# Patient Record
Sex: Female | Born: 1955 | Race: White | Hispanic: No | Marital: Married | Smoking: Former smoker
Health system: Southern US, Community
[De-identification: ages and names within clinical notes are randomized; demographics above are authoritative.]

## PROBLEM LIST (undated history)

## (undated) DIAGNOSIS — Z9289 Personal history of other medical treatment: Secondary | ICD-10-CM

## (undated) DIAGNOSIS — F329 Major depressive disorder, single episode, unspecified: Secondary | ICD-10-CM

## (undated) DIAGNOSIS — F32A Depression, unspecified: Secondary | ICD-10-CM

## (undated) DIAGNOSIS — F419 Anxiety disorder, unspecified: Secondary | ICD-10-CM

## (undated) DIAGNOSIS — E328 Other diseases of thymus: Secondary | ICD-10-CM

## (undated) DIAGNOSIS — G4730 Sleep apnea, unspecified: Secondary | ICD-10-CM

## (undated) DIAGNOSIS — H3530 Unspecified macular degeneration: Secondary | ICD-10-CM

## (undated) DIAGNOSIS — R1011 Right upper quadrant pain: Secondary | ICD-10-CM

## (undated) DIAGNOSIS — E669 Obesity, unspecified: Secondary | ICD-10-CM

## (undated) DIAGNOSIS — J9859 Other diseases of mediastinum, not elsewhere classified: Secondary | ICD-10-CM

## (undated) DIAGNOSIS — K760 Fatty (change of) liver, not elsewhere classified: Secondary | ICD-10-CM

## (undated) DIAGNOSIS — I493 Ventricular premature depolarization: Secondary | ICD-10-CM

## (undated) DIAGNOSIS — E559 Vitamin D deficiency, unspecified: Secondary | ICD-10-CM

## (undated) HISTORY — DX: Vitamin D deficiency, unspecified: E55.9

## (undated) HISTORY — DX: Obesity, unspecified: E66.9

## (undated) HISTORY — DX: Other diseases of mediastinum, not elsewhere classified: J98.59

## (undated) HISTORY — DX: Unspecified macular degeneration: H35.30

## (undated) HISTORY — DX: Right upper quadrant pain: R10.11

## (undated) HISTORY — DX: Sleep apnea, unspecified: G47.30

## (undated) HISTORY — DX: Other diseases of thymus: E32.8

---

## 1986-06-30 HISTORY — PX: TUBAL LIGATION: SHX77

## 2006-06-30 HISTORY — PX: EYE SURGERY: SHX253

## 2007-03-25 ENCOUNTER — Ambulatory Visit (HOSPITAL_COMMUNITY): Admission: RE | Admit: 2007-03-25 | Discharge: 2007-03-25 | Payer: Self-pay | Admitting: Ophthalmology

## 2007-04-08 ENCOUNTER — Ambulatory Visit (HOSPITAL_COMMUNITY): Admission: RE | Admit: 2007-04-08 | Discharge: 2007-04-08 | Payer: Self-pay | Admitting: Ophthalmology

## 2007-11-03 ENCOUNTER — Ambulatory Visit: Payer: Self-pay | Admitting: Thoracic Surgery

## 2008-09-20 ENCOUNTER — Ambulatory Visit: Payer: Self-pay | Admitting: Thoracic Surgery

## 2008-09-20 ENCOUNTER — Encounter: Admission: RE | Admit: 2008-09-20 | Discharge: 2008-09-20 | Payer: Self-pay | Admitting: Thoracic Surgery

## 2009-06-13 ENCOUNTER — Ambulatory Visit: Payer: Self-pay | Admitting: Thoracic Surgery

## 2009-06-13 ENCOUNTER — Encounter: Admission: RE | Admit: 2009-06-13 | Discharge: 2009-06-13 | Payer: Self-pay | Admitting: Thoracic Surgery

## 2009-12-14 ENCOUNTER — Encounter: Admission: RE | Admit: 2009-12-14 | Discharge: 2009-12-14 | Payer: Self-pay | Admitting: Thoracic Surgery

## 2009-12-14 ENCOUNTER — Ambulatory Visit: Payer: Self-pay | Admitting: Thoracic Surgery

## 2010-06-18 ENCOUNTER — Encounter
Admission: RE | Admit: 2010-06-18 | Discharge: 2010-06-18 | Payer: Self-pay | Source: Home / Self Care | Attending: Thoracic Surgery | Admitting: Thoracic Surgery

## 2010-06-18 ENCOUNTER — Ambulatory Visit: Payer: Self-pay | Admitting: Thoracic Surgery

## 2010-07-21 ENCOUNTER — Encounter: Payer: Self-pay | Admitting: Thoracic Surgery

## 2010-10-29 ENCOUNTER — Other Ambulatory Visit: Payer: Self-pay | Admitting: Thoracic Surgery

## 2010-10-29 DIAGNOSIS — J9859 Other diseases of mediastinum, not elsewhere classified: Secondary | ICD-10-CM

## 2010-11-12 NOTE — Letter (Signed)
Nov 03, 2007   Ernestina Penna, M.D.  [address] [place], [place] [address]   Re:  Kayla, Romero               DOB:  May 29, 1956   Dear Roe Coombs:   This is in reference to your referral of Kayla Romero.  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  The studies were done at [facility], and she  subsequently had several studies since then.  This apparently was  described as a low density anterior mediastinal prevascular space mass  that had measured 21 x 19 in diameter.  Unfortunately, the disc they  sent could not be opened on our computer.  So, I discussed in  generalities the followup of an anterior mediastinal or thymic mass.  She has had no symptomatology.  Pulmonary function tests showed an FVC  of 3.15 and an FEV1 of 2.43.   MEDICATIONS:  Xanax p.r.n.   ALLERGIES:  SHE HAS NO KNOWN ALLERGIES.   PAST MEDICAL HISTORY:  Unremarkable.   FAMILY HISTORY:  Noncontributory.   SOCIAL HISTORY:  She is divorced and has 3 children.  Works for a Technical sales engineer.  Quit smoking 3 years ago.  Does not drink alcohol on a regular basis.   REVIEW OF SYSTEMS:  GENERAL:  Her weight has been stable.  CARDIAC:  No  angina or atrial fibrillation.  PULMONARY:  No hemoptysis, asthma, or  bronchitis.  GI:  No GERD, nausea, vomiting, constipation.  GU:  No  kidney disease or frequent urination.  VASCULAR:  No claudication, DVT,  TIA.  NEUROLOGIC:  No dizziness, headaches, blackouts, seizures.  MUSCULOSKELETAL:  No arthritis or joint pains.  PSYCHIATRIC:  No  depression or nervous.  EYE/ENT:  No change in her eyesight or her  hearing.  HEMATOLOGIC:  No problems with bleeding, clotting disorders,  or anemia.   PHYSICAL EXAMINATION:  GENERAL:  She is a well-developed, Caucasian  female in no acute distress.  VITAL SIGNS:  Blood pressure is 109/70, pulse 64, respirations 18.   Sats  were 99%.  HEAD, EYES, EARS, NOSE, AND THROAT:  Unremarkable.  NECK:  Supple without thyromegaly.  There is no supraclavicular or  axillary adenopathy.  CHEST:  Clear to auscultation and percussion.  HEART:  Regular sinus rhythm.  No murmurs.  ABDOMEN:  Soft.  There is no hepatosplenomegaly.  EXTREMITIES:  Pulses are 2+.  There is no clubbing or edema.  NEUROLOGICAL:  She is oriented x3.  Sensory and motor intact.  Cranial  nerves are intact.   I plan to review her CT scans once we get them.  I think this is  probably either a lymph node or a small thymic cyst or possibly  hyperplasia.  I plan to give her a call and make my recommendations  after I review her CT scan.   Sincerely,   Ines Bloomer, M.D.  Electronically Signed   DPB/MEDQ  D:  11/03/2007  T:  11/03/2007  Job:  [id number]   cc:   Lindaann Pascal, PA-C

## 2010-11-12 NOTE — Letter (Signed)
December 14, 2009   Ernestina Penna, M.D.  [address] [place]  [place] [address]   Re:  Kayla Romero, Kayla Romero               DOB:  08-24-1955   I saw the patient back today and her CT scan appears about the same.  They do recommend that they thought it might be larger because of volume  and/or it could be just from volume averaging.  Her blood pressure is  118/80, pulse 81, respirations 18, and saturations were 97%.  This looks  like, this is possibly a lymphoid tissue and I am concerned that we may  need to excise this.  She is very reluctant to have any surgery.  Since  there has only been minimal to no change, I think we can still continue  to watch this, so I will see her back again in 6 months with a CT scan.   Ines Bloomer, M.D.  Electronically Signed   DPB/MEDQ  D:  12/14/2009  T:  12/15/2009  Job:  [id number]

## 2010-11-12 NOTE — Letter (Signed)
Nov 03, 2007   Ernestina Penna, M.D.  [address] [place], [place]  [address]   Re:  ADALAE, BAYSINGER               DOB:  [birth year]   Dear Roe Coombs:   This is really a followup to my initial letter.  We were able to get the  final CT scans on Ms. Ilsa Iha, and I reviewed them from March 2007 up to  February 2009.  This lesion in the left inferior lobe of the thymus has  been there with no change.  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  I doubt that this is a thymic cyst, but since it is there there is a  slight possibility this could be lymphoma or thymoma.  I will see her  back again in 6-8 months with another CT scan done at [facility].  Ms. Ilsa Iha  agrees with this plan.   Ines Bloomer, M.D.  Electronically Signed   DPB/MEDQ  D:  11/03/2007  T:  11/03/2007  Job:  [id number]

## 2010-11-12 NOTE — Letter (Signed)
June 13, 2009   Ernestina Penna, MD  [address] [place], [place] [address]   Re:  ROIZA, WIEDEL               DOB:  [birth year]   Dear Roe Coombs,   I saw the patient back today.  She had her CT scan; however, I do not  have the final reading on this, but it looks like this mass in the left  thymus is about the same or maybe slightly larger.  Her blood pressure  is 130/80, pulse 64, respirations 16, sats are 98%.  Await until I get  the final report before making a determination, but if it is starting to  get bigger I told her that we probably would need to excise this in the  future.  Right now, I am planning to get another CT scan in 6 months  rather than waiting 9 months.   Sincerely,   Ines Bloomer, M.D.  Electronically Signed   DPB/MEDQ  D:  06/13/2009  T:  06/14/2009  Job:  [id number]

## 2010-11-12 NOTE — Letter (Signed)
September 20, 2008   Ernestina Penna, MD  [address] [place]  [place] [address]   Re:  RAMA, SORCI               DOB:  June 08, 1956   Dear Dr. Christell Constant:   I saw the patient back today in followup since we did her last CT scan  and we reviewed it today and compared to a year ago, and there has been  no change in the size of the anterior mediastinal mass which we think is  the thymus gland or possibly thymoma.  There has been no change.  We  will continue to follow and we will see her again in 9 months with  another CT scan.   Ines Bloomer, M.D.  Electronically Signed   DPB/MEDQ  D:  09/20/2008  T:  09/21/2008  Job:  [id number]

## 2010-11-12 NOTE — Letter (Signed)
June 18, 2010   S. Kyra Manges, MD  [phone]  [place], [place]  [address]   Re:  Kayla Romero, Kayla Romero               DOB:  [birth year]   Dear Dr. Elana Alm:   I saw the patient back in the office today and repeated her CT scan  which shows no change in her thymus lesion.  This looks like that could  be a thymoma or large lymph node or there is some benign tissue, but it  is still 17 mm x 20 mm.  We needed to continue observation of this.  So,  I plan to repeat her CT scan in 6 months, if it gets any larger then we  will proceed with a thymectomy.  Her blood pressure is 137/81, pulse 82,  respirations 16, sats were 98%.   Ines Bloomer, M.D.  Electronically Signed   DPB/MEDQ  D:  06/18/2010  T:  06/18/2010  Job:  [id number]

## 2010-12-18 ENCOUNTER — Ambulatory Visit (INDEPENDENT_AMBULATORY_CARE_PROVIDER_SITE_OTHER): Payer: Managed Care, Other (non HMO) | Admitting: Thoracic Surgery

## 2010-12-18 ENCOUNTER — Ambulatory Visit
Admission: RE | Admit: 2010-12-18 | Discharge: 2010-12-18 | Disposition: A | Discharge status: Home / Self Care | Payer: Managed Care, Other (non HMO) | Source: Ambulatory Visit | Attending: Thoracic Surgery | Admitting: Thoracic Surgery

## 2010-12-18 DIAGNOSIS — D382 Neoplasm of uncertain behavior of pleura: Secondary | ICD-10-CM

## 2010-12-18 DIAGNOSIS — J9859 Other diseases of mediastinum, not elsewhere classified: Secondary | ICD-10-CM

## 2011-01-24 NOTE — Assessment & Plan Note (Signed)
OFFICE VISIT  [name], Kayla Romero DOB:  21-Apr-1956                                        December 18, 2010 CHART #:  [id number]  She returned today.  Her mediastinal mass fortunately has not changed. It still is 17 x 20 mm.  This is always the situation that there is nothing.  It has been there for over 2-3 years with an no real major change.  The question is whether we should go ahead and remove this or not.  I will continue to take a conservative role, and see her again in 9 months with a CT scan.  Her blood pressure was 121/74, pulse 61, respirations 18, sats were 96%.  Ines Bloomer, M.D. Electronically Signed  DPB/MEDQ  D:  12/18/2010  T:  12/19/2010  Job:  [id number]

## 2011-04-10 LAB — HEMOGLOBIN AND HEMATOCRIT, BLOOD: HCT: 35.5 — ABNORMAL LOW

## 2011-08-11 ENCOUNTER — Other Ambulatory Visit: Payer: Self-pay | Admitting: Thoracic Surgery

## 2011-08-11 DIAGNOSIS — D381 Neoplasm of uncertain behavior of trachea, bronchus and lung: Secondary | ICD-10-CM

## 2011-09-03 ENCOUNTER — Encounter: Payer: Self-pay | Admitting: Thoracic Surgery

## 2011-09-03 ENCOUNTER — Ambulatory Visit
Admission: RE | Admit: 2011-09-03 | Discharge: 2011-09-03 | Disposition: A | Discharge status: Home / Self Care | Payer: Managed Care, Other (non HMO) | Source: Ambulatory Visit | Attending: Thoracic Surgery | Admitting: Thoracic Surgery

## 2011-09-03 ENCOUNTER — Ambulatory Visit (INDEPENDENT_AMBULATORY_CARE_PROVIDER_SITE_OTHER): Payer: Managed Care, Other (non HMO) | Admitting: Thoracic Surgery

## 2011-09-03 VITALS — BP 114/74 | HR 72 | Resp 20 | Ht 64.0 in | Wt 194.0 lb

## 2011-09-03 DIAGNOSIS — R222 Localized swelling, mass and lump, trunk: Secondary | ICD-10-CM | POA: Insufficient documentation

## 2011-09-03 DIAGNOSIS — D381 Neoplasm of uncertain behavior of trachea, bronchus and lung: Secondary | ICD-10-CM

## 2011-09-03 NOTE — Progress Notes (Signed)
HPI patient returns for followup of her mediastinal nodule. It is unchanged. It is been the same size release 3 years maybe 5 years. It looks like this may be a thymic cyst. I think we still need to follow it and I will get a repeat CT scan in one year. She will see him my partners at that time   Current Outpatient Prescriptions  Medication Sig Dispense Refill  . ALPRAZolam (XANAX) 0.5 MG tablet Take 0.5 mg by mouth at bedtime as needed.      . Calcium Carbonate-Vitamin D (CALTRATE 600+D) 600-400 MG-UNIT per tablet Take 1 tablet by mouth daily.      . cholecalciferol (VITAMIN D) 1000 UNITS tablet Take 1,000 Units by mouth daily.         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

## 2011-10-01 ENCOUNTER — Other Ambulatory Visit: Payer: Self-pay | Admitting: Specialist

## 2011-10-01 DIAGNOSIS — R911 Solitary pulmonary nodule: Secondary | ICD-10-CM

## 2012-07-12 ENCOUNTER — Other Ambulatory Visit: Payer: Self-pay | Admitting: Family Medicine

## 2012-07-12 DIAGNOSIS — R1011 Right upper quadrant pain: Secondary | ICD-10-CM

## 2012-07-15 ENCOUNTER — Ambulatory Visit (HOSPITAL_COMMUNITY)
Admission: RE | Admit: 2012-07-15 | Discharge: 2012-07-15 | Disposition: A | Discharge status: Home / Self Care | Payer: Managed Care, Other (non HMO) | Source: Ambulatory Visit | Attending: Family Medicine | Admitting: Family Medicine

## 2012-07-15 DIAGNOSIS — R110 Nausea: Secondary | ICD-10-CM | POA: Insufficient documentation

## 2012-07-15 DIAGNOSIS — R1011 Right upper quadrant pain: Secondary | ICD-10-CM | POA: Insufficient documentation

## 2012-08-02 ENCOUNTER — Other Ambulatory Visit: Payer: Self-pay | Admitting: Family Medicine

## 2012-08-02 DIAGNOSIS — R1011 Right upper quadrant pain: Secondary | ICD-10-CM

## 2012-08-05 ENCOUNTER — Encounter (HOSPITAL_COMMUNITY)
Admission: RE | Admit: 2012-08-05 | Discharge: 2012-08-05 | Disposition: A | Discharge status: Home / Self Care | Payer: Managed Care, Other (non HMO) | Source: Ambulatory Visit | Attending: Family Medicine | Admitting: Family Medicine

## 2012-08-05 ENCOUNTER — Encounter (HOSPITAL_COMMUNITY): Payer: Self-pay

## 2012-08-05 DIAGNOSIS — R1011 Right upper quadrant pain: Secondary | ICD-10-CM | POA: Insufficient documentation

## 2012-08-05 DIAGNOSIS — R932 Abnormal findings on diagnostic imaging of liver and biliary tract: Secondary | ICD-10-CM | POA: Insufficient documentation

## 2012-08-05 MED ORDER — TECHNETIUM TC 99M MEBROFENIN IV KIT
5.0000 | PACK | Freq: Once | INTRAVENOUS | Status: AC | PRN
  Administered 2012-08-05: 5 via INTRAVENOUS

## 2012-08-09 ENCOUNTER — Other Ambulatory Visit: Payer: Self-pay

## 2012-08-09 DIAGNOSIS — D381 Neoplasm of uncertain behavior of trachea, bronchus and lung: Secondary | ICD-10-CM

## 2012-08-10 ENCOUNTER — Encounter (INDEPENDENT_AMBULATORY_CARE_PROVIDER_SITE_OTHER): Payer: Self-pay

## 2012-08-20 ENCOUNTER — Ambulatory Visit (INDEPENDENT_AMBULATORY_CARE_PROVIDER_SITE_OTHER): Payer: Managed Care, Other (non HMO) | Admitting: General Surgery

## 2012-08-20 ENCOUNTER — Encounter (INDEPENDENT_AMBULATORY_CARE_PROVIDER_SITE_OTHER): Payer: Self-pay | Admitting: General Surgery

## 2012-08-20 VITALS — BP 116/68 | HR 70 | Temp 98.3°F | Resp 18 | Ht 64.0 in | Wt 180.4 lb

## 2012-08-20 DIAGNOSIS — K828 Other specified diseases of gallbladder: Secondary | ICD-10-CM

## 2012-08-24 NOTE — Progress Notes (Signed)
Patient ID: Kayla Romero, female   DOB: 02/12/1956, 56 y.o.   MRN: [id number]  Chief Complaint  Patient presents with  . Pain    RUQ    HPI Kayla Romero is a 56 y.o. female.  Referred by Dr. Wong HPI This is a 56-year-old female who has had some right sided pain for a couple of years it would pass. About a month ago she began to have some significant nausea, discomfort in the right upper quadrant, and lightheadedness at work. She does not describe a relation to eating. This has occurred several times since then. She has a nagging pain most of the time and right upper quadrant. The ultrasound was obtained showing a fatty liver and some sludge in her gallbladder. She also had a HIDA scan that showed she had a decreased gallbladder ejection fraction. This does radiate a little bit to her back. She has had a colonoscopy that was normal. She does note a change in her bowel movements or any bright red blood that she is noted. She comes in today to discuss a possible cholecystectomy.  Past Medical History  Diagnosis Date  . RUQ pain   . Cyst of thymus gland     being monitored    Past Surgical History  Procedure Laterality Date  . Tubal ligation  1988  . Eye surgery  2008    bilateral removal of cataracts    Family History  Problem Relation Age of Onset  . Heart disease Mother   . Cirrhosis Mother   . Kidney disease Mother   . Cancer Mother     ovarian  . Aneurysm Father     Social History History  Substance Use Topics  . Smoking status: Former Smoker    Types: Cigarettes    Quit date: 08/20/2010  . Smokeless tobacco: Never Used  . Alcohol Use: No    No Known Allergies  Current Outpatient Prescriptions  Medication Sig Dispense Refill  . buPROPion (WELLBUTRIN XL) 150 MG 24 hr tablet Take 150 mg by mouth daily.      . Cholecalciferol (VITAMIN D) 2000 UNITS CAPS Take by mouth daily.      . ALPRAZolam (XANAX) 0.5 MG tablet Take 0.5 mg by mouth at bedtime as needed.       . Calcium Carbonate-Vitamin D (CALTRATE 600+D) 600-400 MG-UNIT per tablet Take 1 tablet by mouth daily.       No current facility-administered medications for this visit.    Review of Systems Review of Systems  Constitutional: Negative for fever, chills and unexpected weight change.  HENT: Negative for hearing loss, congestion, sore throat, trouble swallowing and voice change.   Eyes: Negative for visual disturbance.  Respiratory: Negative for cough and wheezing.   Cardiovascular: Negative for chest pain, palpitations and leg swelling.  Gastrointestinal: Positive for abdominal pain. Negative for nausea, vomiting, diarrhea, constipation, blood in stool, abdominal distention and anal bleeding.  Genitourinary: Negative for hematuria, vaginal bleeding and difficulty urinating.  Musculoskeletal: Negative for arthralgias.  Skin: Negative for rash and wound.  Neurological: Negative for seizures, syncope and headaches.  Hematological: Negative for adenopathy. Does not bruise/bleed easily.  Psychiatric/Behavioral: Negative for confusion.    Blood pressure 116/68, pulse 70, temperature 98.3 F (36.8 C), temperature source Temporal, resp. rate 18, height 5' 4" (1.626 m), weight 180 lb 6 oz (81.818 kg), last menstrual period 11/26/2010.  Physical Exam Physical Exam  Vitals reviewed. Constitutional: She appears well-developed and well-nourished.  Eyes: No scleral   icterus.  Cardiovascular: Normal rate, regular rhythm and normal heart sounds.   Pulmonary/Chest: Effort normal and breath sounds normal. She has no wheezes.  Abdominal: Soft. Bowel sounds are normal. She exhibits no distension. There is tenderness (mild ruq tenderness) in the right upper quadrant.  Lymphadenopathy:    She has no cervical adenopathy.    Data Reviewed NUCLEAR MEDICINE HEPATOBILIARY IMAGING WITH GALLBLADDER EF:  Technique: Sequential images of the abdomen were obtained for 60  minutes following intravenous  administration of  radiopharmaceutical. Patient then ingested 8 ounces of  commercially available Ensure Plus and imaging was continued for 60  minutes. A time-activity curve was generated from tracer within  the gallbladder following Ensure Plus ingestion, and the  gallbladder ejection fraction was calculated.  Radiopharmaceutical: 5.0 mCi Tc-99m mebrofenin  Comparison: None  Findings:  Prompt tracer extraction from bloodstream, indicating normal  hepatocellular function.  Prompt excretion of tracer into biliary tree.  Gallbladder visualized at 12 minutes.  Small bowel visualized at 13 minutes.  No hepatic retention of tracer.  Subjectively decreased emptying of tracer from gallbladder  following fatty meal stimulation.  Calculated gallbladder ejection fraction is 21%, abnormally low.  Patient experienced no symptoms following fatty meal ingestion.  IMPRESSION:  Patent biliary tree.  Decreased gallbladder response to fatty meal stimulation with a  decreased gallbladder ejection fraction of 21%.  LIMITED ABDOMINAL ULTRASOUND - RIGHT UPPER QUADRANT  Comparison: CT 09/03/2011.  Findings:  Gallbladder: No shadowing gallstones. There is minimal echogenic  sludge. No gallbladder wall thickening or pericholecystic fluid.  Negative sonographic Murphy's sign according to the ultrasound  technologist. Gallbladder wall thickness measured 1.9 mm.  CBD: Normal in caliber measuring approximately 2.8 mm.  Liver: Normal size with minimal increased echogenicity of the  hepatic parenchymal echotexture without focal parenchymal  abnormality.  Right Kidney: No hydronephrosis.  IMPRESSION:  Liver is normal size. There is minimal increased echogenicity of  the hepatic parenchymal echotexture. Most commonly this is  associated with fatty infiltration of the liver but is not specific  for it. Normal appearance of the bile ducts.  No evidence of cholelithiasis. There is a small amount of sludge   within the gallbladder.   Assessment    Gallbladder sludge Biliary dyskinesia    Plan    Lap chole, I think reasonable with her symptoms to proceed but there is chance we might not cure symptoms I discussed the procedure in detail.  The patient was given educational material.  We discussed the risks and benefits of a laparoscopic cholecystectomy and possible cholangiogram including, but not limited to bleeding, infection, injury to surrounding structures such as the intestine or liver, bile leak, retained gallstones, need to convert to an open procedure, prolonged diarrhea, blood clots such as  DVT, common bile duct injury, anesthesia risks, and possible need for additional procedures.  The likelihood of improvement in symptoms and return to the patient's normal status is good. We discussed the typical post-operative recovery course.         Laney Louderback 08/24/2012, 2:58 PM    

## 2012-08-25 ENCOUNTER — Encounter (HOSPITAL_COMMUNITY): Payer: Self-pay | Admitting: Pharmacist

## 2012-08-27 ENCOUNTER — Encounter (HOSPITAL_COMMUNITY): Payer: Self-pay

## 2012-08-27 ENCOUNTER — Encounter (HOSPITAL_COMMUNITY)
Admission: RE | Admit: 2012-08-27 | Discharge: 2012-08-27 | Disposition: A | Discharge status: Home / Self Care | Payer: Managed Care, Other (non HMO) | Source: Ambulatory Visit | Attending: General Surgery | Admitting: General Surgery

## 2012-08-27 HISTORY — DX: Major depressive disorder, single episode, unspecified: F32.9

## 2012-08-27 HISTORY — DX: Other diseases of thymus: E32.8

## 2012-08-27 HISTORY — DX: Depression, unspecified: F32.A

## 2012-08-27 HISTORY — DX: Anxiety disorder, unspecified: F41.9

## 2012-08-27 HISTORY — DX: Personal history of other medical treatment: Z92.89

## 2012-08-27 HISTORY — DX: Fatty (change of) liver, not elsewhere classified: K76.0

## 2012-08-27 LAB — COMPREHENSIVE METABOLIC PANEL
ALT: 31 U/L (ref 0–35)
Calcium: 9.5 mg/dL (ref 8.4–10.5)
Creatinine, Ser: 0.65 mg/dL (ref 0.50–1.10)
GFR calc Af Amer: >90 mL/min (ref >90)
Glucose, Bld: 90 mg/dL (ref 70–99)
Sodium: 142 mEq/L (ref 135–145)
Total Protein: 7.5 g/dL (ref 6.0–8.3)

## 2012-08-27 LAB — CBC WITH DIFFERENTIAL/PLATELET
Basophils Absolute: 0 10*3/uL (ref 0.0–0.1)
Eosinophils Absolute: 0.1 10*3/uL (ref 0.0–0.7)
Eosinophils Relative: 1 % (ref 0–5)
Lymphs Abs: 3.1 10*3/uL (ref 0.7–4.0)
MCH: 31.2 pg (ref 26.0–34.0)
MCV: 87.7 fL (ref 78.0–100.0)
Monocytes Absolute: 0.6 10*3/uL (ref 0.1–1.0)
Platelets: 292 10*3/uL (ref 150–400)
RDW: 12.7 % (ref 11.5–15.5)

## 2012-08-27 NOTE — Progress Notes (Signed)
Pt. Wanted peace of mind, so she sought out the opinion of a cardiologist in [place] some time ago, relative to Thymic cyst. Reports that the cardio. reassured her, told her that everything was OK & no need for followup.

## 2012-08-27 NOTE — Pre-Procedure Instructions (Signed)
Kayla Romero  08/27/2012   Your procedure is scheduled on:  Tuesday, March 4th  Report to [facility] at 1200 PM.  Call this number if you have problems the morning of surgery: [phone]   Remember:   Do not eat food or drink liquids after midnight.    Take these medicines the morning of surgery with A SIP OF WATER: xanax if needed, wellbutrin   Do not wear jewelry, make-up or nail polish.  Do not wear lotions, powders, or perfumes,deodorant.  Do not shave 48 hours prior to surgery.   Do not bring valuables to the hospital.  Contacts, dentures or bridgework may not be worn into surgery.  Leave suitcase in the car. After surgery it may be brought to your room.  For patients admitted to the hospital, checkout time is 11:00 AM the day of discharge.   Patients discharged the day of surgery will not be allowed to drive home.    Special Instructions: Shower using CHG 2 nights before surgery and the night before surgery.  If you shower the day of surgery use CHG.  Use special wash - you have one bottle of CHG for all showers.  You should use approximately 1/3 of the bottle for each shower.   Please read over the following fact sheets that you were given: Pain Booklet, Coughing and Deep Breathing, MRSA Information and Surgical Site Infection Prevention

## 2012-08-30 MED ORDER — DEXTROSE 5 % IV SOLN
2.0000 g | INTRAVENOUS | Status: AC
  Administered 2012-08-31: 2 g via INTRAVENOUS
  Filled 2012-08-30: qty 2

## 2012-08-31 ENCOUNTER — Encounter (HOSPITAL_COMMUNITY)
Admission: RE | Disposition: A | Discharge status: Home / Self Care | Payer: Self-pay | Source: Ambulatory Visit | Attending: General Surgery

## 2012-08-31 ENCOUNTER — Ambulatory Visit (HOSPITAL_COMMUNITY): Payer: Managed Care, Other (non HMO) | Admitting: Certified Registered"

## 2012-08-31 ENCOUNTER — Encounter (HOSPITAL_COMMUNITY): Payer: Self-pay | Admitting: Anesthesiology

## 2012-08-31 ENCOUNTER — Encounter (HOSPITAL_COMMUNITY): Payer: Self-pay | Admitting: Certified Registered"

## 2012-08-31 ENCOUNTER — Observation Stay (HOSPITAL_COMMUNITY)
Admission: RE | Admit: 2012-08-31 | Discharge: 2012-09-02 | Disposition: A | Discharge status: Home / Self Care | Payer: Managed Care, Other (non HMO) | Source: Ambulatory Visit | Attending: General Surgery | Admitting: General Surgery

## 2012-08-31 DIAGNOSIS — K828 Other specified diseases of gallbladder: Secondary | ICD-10-CM | POA: Insufficient documentation

## 2012-08-31 DIAGNOSIS — K811 Chronic cholecystitis: Principal | ICD-10-CM | POA: Insufficient documentation

## 2012-08-31 DIAGNOSIS — Z79899 Other long term (current) drug therapy: Secondary | ICD-10-CM | POA: Insufficient documentation

## 2012-08-31 DIAGNOSIS — E328 Other diseases of thymus: Secondary | ICD-10-CM | POA: Insufficient documentation

## 2012-08-31 DIAGNOSIS — Z9889 Other specified postprocedural states: Secondary | ICD-10-CM

## 2012-08-31 DIAGNOSIS — R338 Other retention of urine: Secondary | ICD-10-CM | POA: Insufficient documentation

## 2012-08-31 HISTORY — PX: CHOLECYSTECTOMY: SHX55

## 2012-08-31 SURGERY — LAPAROSCOPIC CHOLECYSTECTOMY
Anesthesia: General | Site: Abdomen | Wound class: Contaminated

## 2012-08-31 MED ORDER — LACTATED RINGERS IV SOLN
INTRAVENOUS | Status: DC
  Administered 2012-08-31: 13:00:00 via INTRAVENOUS

## 2012-08-31 MED ORDER — MEPERIDINE HCL 25 MG/ML IJ SOLN: 6.2500 mg | INTRAMUSCULAR | Status: DC | PRN

## 2012-08-31 MED ORDER — PROMETHAZINE HCL 25 MG/ML IJ SOLN: 6.2500 mg | INTRAMUSCULAR | Status: DC | PRN

## 2012-08-31 MED ORDER — PROPOFOL 10 MG/ML IV BOLUS
INTRAVENOUS | Status: DC | PRN
  Administered 2012-08-31: 160 mg via INTRAVENOUS

## 2012-08-31 MED ORDER — OXYCODONE HCL 5 MG PO TABS
ORAL_TABLET | ORAL | Status: AC
  Filled 2012-08-31: qty 2

## 2012-08-31 MED ORDER — ACETAMINOPHEN 325 MG PO TABS: 650.0000 mg | ORAL_TABLET | Freq: Four times a day (QID) | ORAL | Status: DC | PRN

## 2012-08-31 MED ORDER — SODIUM CHLORIDE 0.9 % IV SOLN: 250.0000 mL | INTRAVENOUS | Status: DC | PRN

## 2012-08-31 MED ORDER — FENTANYL CITRATE 0.05 MG/ML IJ SOLN
INTRAMUSCULAR | Status: AC
  Filled 2012-08-31: qty 2

## 2012-08-31 MED ORDER — SODIUM CHLORIDE 0.9 % IV SOLN: INTRAVENOUS | Status: DC

## 2012-08-31 MED ORDER — ACETAMINOPHEN 650 MG RE SUPP: 650.0000 mg | Freq: Four times a day (QID) | RECTAL | Status: DC | PRN

## 2012-08-31 MED ORDER — LIDOCAINE HCL 4 % MT SOLN
OROMUCOSAL | Status: DC | PRN
  Administered 2012-08-31: 4 mL via TOPICAL

## 2012-08-31 MED ORDER — GLYCOPYRROLATE 0.2 MG/ML IJ SOLN
INTRAMUSCULAR | Status: DC | PRN
  Administered 2012-08-31: 0.6 mg via INTRAVENOUS

## 2012-08-31 MED ORDER — ACETAMINOPHEN 650 MG RE SUPP: 650.0000 mg | RECTAL | Status: DC | PRN

## 2012-08-31 MED ORDER — MORPHINE SULFATE 2 MG/ML IJ SOLN
2.0000 mg | INTRAMUSCULAR | Status: DC | PRN
  Administered 2012-08-31 – 2012-09-01 (×4): 2 mg via INTRAVENOUS
  Filled 2012-08-31: qty 1

## 2012-08-31 MED ORDER — SODIUM CHLORIDE 0.9 % IJ SOLN: 3.0000 mL | Freq: Two times a day (BID) | INTRAMUSCULAR | Status: DC

## 2012-08-31 MED ORDER — FENTANYL CITRATE 0.05 MG/ML IJ SOLN
INTRAMUSCULAR | Status: DC | PRN
  Administered 2012-08-31: 50 ug via INTRAVENOUS
  Administered 2012-08-31 (×2): 100 ug via INTRAVENOUS

## 2012-08-31 MED ORDER — BUPIVACAINE-EPINEPHRINE 0.25% -1:200000 IJ SOLN
INTRAMUSCULAR | Status: AC
  Filled 2012-08-31: qty 1

## 2012-08-31 MED ORDER — SODIUM CHLORIDE 0.9 % IV SOLN
INTRAVENOUS | Status: DC
  Administered 2012-08-31: 75 mL/h via INTRAVENOUS
  Administered 2012-09-01: 20:00:00 via INTRAVENOUS
  Administered 2012-09-01: 75 mL/h via INTRAVENOUS

## 2012-08-31 MED ORDER — FENTANYL CITRATE 0.05 MG/ML IJ SOLN
25.0000 ug | INTRAMUSCULAR | Status: DC | PRN
  Administered 2012-08-31 (×3): 50 ug via INTRAVENOUS

## 2012-08-31 MED ORDER — OXYCODONE HCL 5 MG PO TABS: 5.0000 mg | ORAL_TABLET | ORAL | Status: DC | PRN

## 2012-08-31 MED ORDER — BUPIVACAINE-EPINEPHRINE 0.25% -1:200000 IJ SOLN
INTRAMUSCULAR | Status: DC | PRN
  Administered 2012-08-31: 50 mL

## 2012-08-31 MED ORDER — ROCURONIUM BROMIDE 100 MG/10ML IV SOLN
INTRAVENOUS | Status: DC | PRN
  Administered 2012-08-31: 40 mg via INTRAVENOUS

## 2012-08-31 MED ORDER — NEOSTIGMINE METHYLSULFATE 1 MG/ML IJ SOLN
INTRAMUSCULAR | Status: DC | PRN
  Administered 2012-08-31: 4 mg via INTRAVENOUS

## 2012-08-31 MED ORDER — ONDANSETRON HCL 4 MG/2ML IJ SOLN
INTRAMUSCULAR | Status: DC | PRN
  Administered 2012-08-31: 4 mg via INTRAVENOUS

## 2012-08-31 MED ORDER — KETOROLAC TROMETHAMINE 30 MG/ML IJ SOLN: 15.0000 mg | Freq: Once | INTRAMUSCULAR | Status: DC | PRN

## 2012-08-31 MED ORDER — SODIUM CHLORIDE 0.9 % IR SOLN
Status: DC | PRN
  Administered 2012-08-31: 1000 mL

## 2012-08-31 MED ORDER — SODIUM CHLORIDE 0.9 % IJ SOLN: 3.0000 mL | INTRAMUSCULAR | Status: DC | PRN

## 2012-08-31 MED ORDER — OXYCODONE-ACETAMINOPHEN 5-325 MG PO TABS: 1.0000 | ORAL_TABLET | ORAL | Status: DC | PRN

## 2012-08-31 MED ORDER — ONDANSETRON HCL 4 MG/2ML IJ SOLN: 4.0000 mg | Freq: Four times a day (QID) | INTRAMUSCULAR | Status: DC | PRN

## 2012-08-31 MED ORDER — ACETAMINOPHEN 325 MG PO TABS
650.0000 mg | ORAL_TABLET | ORAL | Status: DC | PRN
  Administered 2012-09-01: 650 mg via ORAL
  Filled 2012-08-31: qty 2

## 2012-08-31 MED ORDER — MORPHINE SULFATE 2 MG/ML IJ SOLN
INTRAMUSCULAR | Status: AC
  Filled 2012-08-31: qty 1

## 2012-08-31 MED ORDER — HEMOSTATIC AGENTS (NO CHARGE) OPTIME
TOPICAL | Status: DC | PRN
  Administered 2012-08-31: 1

## 2012-08-31 MED ORDER — MORPHINE SULFATE 2 MG/ML IJ SOLN
2.0000 mg | INTRAMUSCULAR | Status: DC | PRN
  Filled 2012-08-31 (×2): qty 1

## 2012-08-31 MED ORDER — MIDAZOLAM HCL 5 MG/5ML IJ SOLN
INTRAMUSCULAR | Status: DC | PRN
  Administered 2012-08-31: 2 mg via INTRAVENOUS

## 2012-08-31 MED ORDER — OXYCODONE HCL 5 MG PO TABS
5.0000 mg | ORAL_TABLET | ORAL | Status: DC | PRN
  Administered 2012-08-31 – 2012-09-01 (×4): 10 mg via ORAL
  Administered 2012-09-01 (×2): 5 mg via ORAL
  Administered 2012-09-02 (×2): 10 mg via ORAL
  Filled 2012-08-31 (×6): qty 2

## 2012-08-31 MED ORDER — MIDAZOLAM HCL 2 MG/2ML IJ SOLN: 0.5000 mg | Freq: Once | INTRAMUSCULAR | Status: DC | PRN

## 2012-08-31 MED ORDER — LIDOCAINE HCL (CARDIAC) 20 MG/ML IV SOLN
INTRAVENOUS | Status: DC | PRN
  Administered 2012-08-31: 50 mg via INTRAVENOUS

## 2012-08-31 SURGICAL SUPPLY — 35 items
APPLIER CLIP 5 13 M/L LIGAMAX5 (MISCELLANEOUS) ×2
BLADE SURG ROTATE 9660 (MISCELLANEOUS) IMPLANT
CANISTER SUCTION 2500CC (MISCELLANEOUS) ×2 IMPLANT
CHLORAPREP W/TINT 26ML (MISCELLANEOUS) ×2 IMPLANT
CLIP APPLIE 5 13 M/L LIGAMAX5 (MISCELLANEOUS) ×1 IMPLANT
CLOTH BEACON ORANGE TIMEOUT ST (SAFETY) ×2 IMPLANT
COVER MAYO STAND STRL (DRAPES) IMPLANT
COVER SURGICAL LIGHT HANDLE (MISCELLANEOUS) ×2 IMPLANT
DECANTER SPIKE VIAL GLASS SM (MISCELLANEOUS) ×2 IMPLANT
DERMABOND ADVANCED (GAUZE/BANDAGES/DRESSINGS) ×1
DERMABOND ADVANCED .7 DNX12 (GAUZE/BANDAGES/DRESSINGS) ×1 IMPLANT
DRAPE C-ARM 42X72 X-RAY (DRAPES) IMPLANT
ELECT REM PT RETURN 9FT ADLT (ELECTROSURGICAL) ×2
ELECTRODE REM PT RTRN 9FT ADLT (ELECTROSURGICAL) ×1 IMPLANT
GLOVE BIO SURGEON STRL SZ7 (GLOVE) ×2 IMPLANT
GLOVE BIOGEL PI IND STRL 7.5 (GLOVE) ×1 IMPLANT
GLOVE BIOGEL PI INDICATOR 7.5 (GLOVE) ×1
GOWN STRL NON-REIN LRG LVL3 (GOWN DISPOSABLE) ×8 IMPLANT
HEMOSTAT SNOW SURGICEL 2X4 (HEMOSTASIS) ×2 IMPLANT
KIT BASIN OR (CUSTOM PROCEDURE TRAY) ×2 IMPLANT
KIT ROOM TURNOVER OR (KITS) ×2 IMPLANT
NS IRRIG 1000ML POUR BTL (IV SOLUTION) ×2 IMPLANT
PAD ARMBOARD 7.5X6 YLW CONV (MISCELLANEOUS) ×2 IMPLANT
POUCH SPECIMEN RETRIEVAL 10MM (ENDOMECHANICALS) ×2 IMPLANT
SCISSORS LAP 5X35 DISP (ENDOMECHANICALS) IMPLANT
SET CHOLANGIOGRAPH 5 50 .035 (SET/KITS/TRAYS/PACK) IMPLANT
SET IRRIG TUBING LAPAROSCOPIC (IRRIGATION / IRRIGATOR) ×2 IMPLANT
SLEEVE ENDOPATH XCEL 5M (ENDOMECHANICALS) ×4 IMPLANT
SPECIMEN JAR SMALL (MISCELLANEOUS) ×2 IMPLANT
SUT MNCRL AB 4-0 PS2 18 (SUTURE) ×2 IMPLANT
TOWEL OR 17X24 6PK STRL BLUE (TOWEL DISPOSABLE) ×2 IMPLANT
TOWEL OR 17X26 10 PK STRL BLUE (TOWEL DISPOSABLE) ×2 IMPLANT
TRAY LAPAROSCOPIC (CUSTOM PROCEDURE TRAY) ×2 IMPLANT
TROCAR XCEL BLUNT TIP 100MML (ENDOMECHANICALS) ×2 IMPLANT
TROCAR XCEL NON-BLD 5MMX100MML (ENDOMECHANICALS) ×2 IMPLANT

## 2012-08-31 NOTE — H&P (View-Only) (Signed)
Patient ID: Kayla Romero, female   DOB: 1956-02-15, 57 y.o.   MRN: [id number]  Chief Complaint  Patient presents with  . Pain    RUQ    HPI Kayla Romero is a 57 y.o. female.  Referred by Dr. Modesto Charon HPI This is a 56 year old female who has had some right sided pain for a couple of years it would pass. About a month ago she began to have some significant nausea, discomfort in the right upper quadrant, and lightheadedness at work. She does not describe a relation to eating. This has occurred several times since then. She has a nagging pain most of the time and right upper quadrant. The ultrasound was obtained showing a fatty liver and some sludge in her gallbladder. She also had a HIDA scan that showed she had a decreased gallbladder ejection fraction. This does radiate a little bit to her back. She has had a colonoscopy that was normal. She does note a change in her bowel movements or any bright red blood that she is noted. She comes in today to discuss a possible cholecystectomy.  Past Medical History  Diagnosis Date  . RUQ pain   . Cyst of thymus gland     being monitored    Past Surgical History  Procedure Laterality Date  . Tubal ligation  1988  . Eye surgery  2008    bilateral removal of cataracts    Family History  Problem Relation Age of Onset  . Heart disease Mother   . Cirrhosis Mother   . Kidney disease Mother   . Cancer Mother     ovarian  . Aneurysm Father     Social History History  Substance Use Topics  . Smoking status: Former Smoker    Types: Cigarettes    Quit date: 08/20/2010  . Smokeless tobacco: Never Used  . Alcohol Use: No    No Known Allergies  Current Outpatient Prescriptions  Medication Sig Dispense Refill  . buPROPion (WELLBUTRIN XL) 150 MG 24 hr tablet Take 150 mg by mouth daily.      . Cholecalciferol (VITAMIN D) 2000 UNITS CAPS Take by mouth daily.      Marland Kitchen ALPRAZolam (XANAX) 0.5 MG tablet Take 0.5 mg by mouth at bedtime as needed.       . Calcium Carbonate-Vitamin D (CALTRATE 600+D) 600-400 MG-UNIT per tablet Take 1 tablet by mouth daily.       No current facility-administered medications for this visit.    Review of Systems Review of Systems  Constitutional: Negative for fever, chills and unexpected weight change.  HENT: Negative for hearing loss, congestion, sore throat, trouble swallowing and voice change.   Eyes: Negative for visual disturbance.  Respiratory: Negative for cough and wheezing.   Cardiovascular: Negative for chest pain, palpitations and leg swelling.  Gastrointestinal: Positive for abdominal pain. Negative for nausea, vomiting, diarrhea, constipation, blood in stool, abdominal distention and anal bleeding.  Genitourinary: Negative for hematuria, vaginal bleeding and difficulty urinating.  Musculoskeletal: Negative for arthralgias.  Skin: Negative for rash and wound.  Neurological: Negative for seizures, syncope and headaches.  Hematological: Negative for adenopathy. Does not bruise/bleed easily.  Psychiatric/Behavioral: Negative for confusion.    Blood pressure 116/68, pulse 70, temperature 98.3 F (36.8 C), temperature source Temporal, resp. rate 18, height 5\' 4"  (1.626 m), weight 180 lb 6 oz (81.818 kg), last menstrual period 11/26/2010.  Physical Exam Physical Exam  Vitals reviewed. Constitutional: She appears well-developed and well-nourished.  Eyes: No scleral  icterus.  Cardiovascular: Normal rate, regular rhythm and normal heart sounds.   Pulmonary/Chest: Effort normal and breath sounds normal. She has no wheezes.  Abdominal: Soft. Bowel sounds are normal. She exhibits no distension. There is tenderness (mild ruq tenderness) in the right upper quadrant.  Lymphadenopathy:    She has no cervical adenopathy.    Data Reviewed NUCLEAR MEDICINE HEPATOBILIARY IMAGING WITH GALLBLADDER EF:  Technique: Sequential images of the abdomen were obtained for 60  minutes following intravenous  administration of  radiopharmaceutical. Patient then ingested 8 ounces of  commercially available Ensure Plus and imaging was continued for 60  minutes. A time-activity curve was generated from tracer within  the gallbladder following Ensure Plus ingestion, and the  gallbladder ejection fraction was calculated.  Radiopharmaceutical: 5.0 mCi Tc-42m mebrofenin  Comparison: None  Findings:  Prompt tracer extraction from bloodstream, indicating normal  hepatocellular function.  Prompt excretion of tracer into biliary tree.  Gallbladder visualized at 12 minutes.  Small bowel visualized at 13 minutes.  No hepatic retention of tracer.  Subjectively decreased emptying of tracer from gallbladder  following fatty meal stimulation.  Calculated gallbladder ejection fraction is 21%, abnormally low.  Patient experienced no symptoms following fatty meal ingestion.  IMPRESSION:  Patent biliary tree.  Decreased gallbladder response to fatty meal stimulation with a  decreased gallbladder ejection fraction of 21%.  LIMITED ABDOMINAL ULTRASOUND - RIGHT UPPER QUADRANT  Comparison: CT 09/03/2011.  Findings:  Gallbladder: No shadowing gallstones. There is minimal echogenic  sludge. No gallbladder wall thickening or pericholecystic fluid.  Negative sonographic Murphy's sign according to the ultrasound  technologist. Gallbladder wall thickness measured 1.9 mm.  CBD: Normal in caliber measuring approximately 2.8 mm.  Liver: Normal size with minimal increased echogenicity of the  hepatic parenchymal echotexture without focal parenchymal  abnormality.  Right Kidney: No hydronephrosis.  IMPRESSION:  Liver is normal size. There is minimal increased echogenicity of  the hepatic parenchymal echotexture. Most commonly this is  associated with fatty infiltration of the liver but is not specific  for it. Normal appearance of the bile ducts.  No evidence of cholelithiasis. There is a small amount of sludge   within the gallbladder.   Assessment    Gallbladder sludge Biliary dyskinesia    Plan    Lap chole, I think reasonable with her symptoms to proceed but there is chance we might not cure symptoms I discussed the procedure in detail.  The patient was given Agricultural engineer.  We discussed the risks and benefits of a laparoscopic cholecystectomy and possible cholangiogram including, but not limited to bleeding, infection, injury to surrounding structures such as the intestine or liver, bile leak, retained gallstones, need to convert to an open procedure, prolonged diarrhea, blood clots such as  DVT, common bile duct injury, anesthesia risks, and possible need for additional procedures.  The likelihood of improvement in symptoms and return to the patient's normal status is good. We discussed the typical post-operative recovery course.         Joanette Silveria 08/24/2012, 2:58 PM

## 2012-08-31 NOTE — Anesthesia Procedure Notes (Signed)
Procedure Name: Intubation Date/Time: 08/31/2012 1:22 PM Performed by: Sharlene Dory E Pre-anesthesia Checklist: Patient identified, Emergency Drugs available, Suction available, Patient being monitored and Timeout performed Patient Re-evaluated:Patient Re-evaluated prior to inductionOxygen Delivery Method: Circle system utilized Preoxygenation: Pre-oxygenation with 100% oxygen Intubation Type: IV induction Ventilation: Mask ventilation without difficulty Laryngoscope Size: Mac and 3 Grade View: Grade I Tube type: Oral Tube size: 7.0 mm Number of attempts: 1 Airway Equipment and Method: Stylet and LTA kit utilized Placement Confirmation: ETT inserted through vocal cords under direct vision,  positive ETCO2 and breath sounds checked- equal and bilateral Secured at: 21 cm Tube secured with: Tape Dental Injury: Teeth and Oropharynx as per pre-operative assessment

## 2012-08-31 NOTE — Op Note (Signed)
Preoperative diagnosis: Biliary dyskinesia Postoperative diagnosis: Same as above Procedure: Laparoscopic cholecystectomy Surgeon: Dr. Harden Mo Anesthesia: Gen. EBL: Minimal Complications: None Drains: None Specimens: Gallbladder to pathology Sponge and needle count was correct x2 in of operation Disposition to recovery room in stable condition  Indications: This is a 57 year old female with right upper quadrant pain associated with some other symptoms that may be related to her gallbladder. She underwent an ultrasound which essentially normal but had a HIDA scan that shows she has a decreased ejection fraction and this reproduced her symptoms. She and I discussed her options including observation versus cholecystectomy. She decided she would like to proceed with cholecystectomy and understands that this may not relieve all of her symptoms.  Procedure: After informed consent was obtained the patient was taken to the operating room. She was administered 2 g of cefoxitin. Sequential compression devices were placed on her legs. She was placed under general endotracheal anesthesia without complication. Her abdomen was prepped and draped in the standard sterile surgical fashion. Surgical timeout was performed.  I infiltrated Marcaine below her umbilicus. I made a vertical incision. I entered her fascia sharply. I entered her peritoneum bluntly. There was no evidence of an entry injury. I then placed a 0 Vicryl pursestring suture through the fascia. I inserted a Hassan trocar and insufflated the abdomen to 15 mm mercury pressure. I then placed 3 further 5 mm trocars in the epigastrium and right upper quadrant under direct vision after infiltration with local anesthetic without complication. I then retracted the gallbladder cephalad. She had some scarring in her triangle but this was fairly easy to dissect the triangle and obtain the critical view of safety. Once I had done this I identified clipped  and divided the artery. I treated the duct in a similar fashion. I then removed the gallbladder from the liver bed. It was very adherent to the liver bed. I did make one small entrance into the gallbladder and has a small spillage of bile. The gallbladder was then removed and placed in an Endo Catch bag. I then obtained hemostasis. I irrigated until this was clear. Gallbladder was removed from the umbilicus. I then placed a piece of Surgicel the bed. I then removed the umbilical trocar and tied the pursestring down. This completely obliterated the defect. I then desufflated the abdomen and removed the remaining trocars. Causes for Monocryl and Dermabond. She tolerated this well was extubated and transferred to the recovery room in stable condition.

## 2012-08-31 NOTE — Anesthesia Preprocedure Evaluation (Addendum)
Anesthesia Evaluation  Patient identified by MRN, date of birth, ID band Patient awake    Reviewed: Allergy & Precautions, H&P , Patient's Chart, lab work & pertinent test results, reviewed documented beta blocker date and time   History of Anesthesia Complications Negative for: history of anesthetic complications  Airway Mallampati: II TM Distance: >3 FB Neck ROM: full    Dental no notable dental hx.    Pulmonary neg pulmonary ROS,  breath sounds clear to auscultation  Pulmonary exam normal       Cardiovascular Exercise Tolerance: Good negative cardio ROS  Rhythm:regular Rate:Normal     Neuro/Psych negative neurological ROS  negative psych ROS   GI/Hepatic negative GI ROS, Neg liver ROS,   Endo/Other  negative endocrine ROS  Renal/GU negative Renal ROS     Musculoskeletal   Abdominal   Peds  Hematology negative hematology ROS (+)   Anesthesia Other Findings RUQ pain     Cyst of thymus gland   being monitored    Thymus, cyst   first seen on 2007, had some care with Dr. Edwyna Shell & then a Octavio Manns MD fr. [facility]   History of EKG   first presented /w L shoulder pain, 2007, seen by [facility]. , had EKG then & it was wnl.      Anxiety   h/o, recently has not had to use xanax much. Depression        Fatty liver     Thymus, cyst   diagnosed approx. 2007, seen by Dr. Edwyna Shell, but now followed by [facility] in [place]    Reproductive/Obstetrics negative OB ROS                           Anesthesia Physical Anesthesia Plan  ASA: II  Anesthesia Plan: General ETT   Post-op Pain Management:    Induction:   Airway Management Planned:   Additional Equipment:   Intra-op Plan:   Post-operative Plan:   Informed Consent: I have reviewed the patients History and Physical, chart, labs and discussed the procedure including the risks, benefits and alternatives for the proposed  anesthesia with the patient or authorized representative who has indicated his/her understanding and acceptance.   Dental Advisory Given  Plan Discussed with: CRNA and Surgeon  Anesthesia Plan Comments:         Anesthesia Quick Evaluation

## 2012-08-31 NOTE — Interval H&P Note (Signed)
History and Physical Interval Note:  08/31/2012 12:59 PM  Kayla Romero  has presented today for surgery, with the diagnosis of biliary dyskinesia   The various methods of treatment have been discussed with the patient and family. After consideration of risks, benefits and other options for treatment, the patient has consented to  Procedure(s): LAPAROSCOPIC CHOLECYSTECTOMY possible IOC  (N/A) as a surgical intervention .  The patient's history has been reviewed, patient examined, no change in status, stable for surgery.  I have reviewed the patient's chart and labs.  Questions were answered to the patient's satisfaction.     Jayni Prescher

## 2012-08-31 NOTE — Transfer of Care (Signed)
Immediate Anesthesia Transfer of Care Note  Patient: Kayla Romero  Procedure(s) Performed: Procedure(s): LAPAROSCOPIC CHOLECYSTECTOMY possible IOC  (N/A)  Patient Location: PACU  Anesthesia Type:General  Level of Consciousness: awake, alert  and oriented  Airway & Oxygen Therapy: Patient Spontanous Breathing and Patient connected to nasal cannula oxygen  Post-op Assessment: Report given to PACU RN, Post -op Vital signs reviewed and stable and Patient moving all extremities X 4  Post vital signs: Reviewed and stable  Complications: No apparent anesthesia complications

## 2012-08-31 NOTE — Anesthesia Postprocedure Evaluation (Signed)
  Anesthesia Post Note  Patient: Kayla Romero  Procedure(s) Performed: Procedure(s) (LRB): LAPAROSCOPIC CHOLECYSTECTOMY possible IOC  (N/A)  Anesthesia type: GA  Patient location: PACU  Post pain: Pain level controlled  Post assessment: Post-op Vital signs reviewed  Last Vitals:  Filed Vitals:   08/31/12 1445  BP:   Pulse: 53  Temp:   Resp: 11    Post vital signs: Reviewed  Level of consciousness: sedated  Complications: No apparent anesthesia complications

## 2012-08-31 NOTE — Preoperative (Signed)
Beta Blockers   Reason not to administer Beta Blockers:Not Applicable 

## 2012-09-01 ENCOUNTER — Encounter (HOSPITAL_COMMUNITY): Payer: Self-pay | Admitting: General Surgery

## 2012-09-01 LAB — CBC
Hemoglobin: 11.4 g/dL — ABNORMAL LOW (ref 12.0–15.0)
MCHC: 34.9 g/dL (ref 30.0–36.0)
Platelets: 241 10*3/uL (ref 150–400)

## 2012-09-01 MED ORDER — BUPROPION HCL ER (XL) 150 MG PO TB24
150.0000 mg | ORAL_TABLET | Freq: Every day | ORAL | Status: DC
Start: 2012-09-01 — End: 2012-09-02
  Administered 2012-09-01 – 2012-09-02 (×2): 150 mg via ORAL
  Filled 2012-09-01 (×3): qty 1

## 2012-09-01 MED ORDER — ALPRAZOLAM 0.5 MG PO TABS
0.5000 mg | ORAL_TABLET | Freq: Every evening | ORAL | Status: DC | PRN
  Administered 2012-09-01: 0.5 mg via ORAL
  Filled 2012-09-01: qty 1

## 2012-09-01 NOTE — Progress Notes (Signed)
1 Day Post-Op  Subjective: Unable to void, pain under fair control upper abdomen, no n/v but not really tried anything oral yet   Objective: Vital signs in last 24 hours: Temp:  [96.5 F (35.8 C)-98.1 F (36.7 C)] 97.7 F (36.5 C) (03/05 0458) Pulse Rate:  [52-85] 58 (03/05 0458) Resp:  [6-20] 17 (03/05 0458) BP: (91-139)/(48-83) 95/53 mmHg (03/05 0458) SpO2:  [73 %-100 %] 96 % (03/05 0458) Weight:  [183 lb 4.8 oz (83.144 kg)] 183 lb 4.8 oz (83.144 kg) (03/04 1856)    Intake/Output from previous day: 03/04 0701 - 03/05 0700 In: 1833.8 [P.O.:300; I.V.:1533.8] Out: 1050 [Urine:1000; Blood:50] Intake/Output this shift:    General appearance: no distress GI: soft, incisions clean, approp tender   Assessment/Plan: POD 1 lap chole 1. Pain fair control on oral meds now, will continue 2. pulm toilet 3. Decreased bp, will check cbc this am  4. Inability to void, check bladder scan, had 1000cc out of in/out cath at 3 am and I think she will likely need foley and stay overnight 5. scds  [facility] 09/01/2012

## 2012-09-01 NOTE — Progress Notes (Signed)
Patient did not have a sensation to void.  0400 Bladder scan check and found  510cc.  0430 this morning In and out cath done and 1000 cc of urine drained.  Pt. Is comfortable now.  Pt.

## 2012-09-02 LAB — CBC
MCV: 87.5 fL (ref 78.0–100.0)
Platelets: 244 10*3/uL (ref 150–400)
RBC: 3.92 MIL/uL (ref 3.87–5.11)
WBC: 8.4 10*3/uL (ref 4.0–10.5)

## 2012-09-02 MED ORDER — OXYCODONE-ACETAMINOPHEN 5-325 MG PO TABS: 1.0000 | ORAL_TABLET | ORAL | Status: DC | PRN

## 2012-09-02 NOTE — Discharge Summary (Signed)
Physician Discharge Summary  Patient ID: Kayla Romero MRN: [id number] DOB/AGE: Nov 25, 1955 57 y.o.  Admit date: 08/31/2012 Discharge date: 09/02/2012  Admission Diagnoses: biliary dyskinesia  Discharge Diagnoses: same Urinary retention Active Problems:   * No active hospital problems. *   Discharged Condition: good  Hospital Course: pt had urinary retention treated with foley.  Otherwise unremarkable.  Consults: None  Significant Diagnostic Studies: labs:  CBC    Component Value Date/Time   WBC 8.1 09/01/2012 1155   RBC 3.69* 09/01/2012 1155   HGB 11.4* 09/01/2012 1155   HCT 32.7* 09/01/2012 1155   PLT 241 09/01/2012 1155   MCV 88.6 09/01/2012 1155   MCH 30.9 09/01/2012 1155   MCHC 34.9 09/01/2012 1155   RDW 12.9 09/01/2012 1155   LYMPHSABS 3.1 08/27/2012 1435   MONOABS 0.6 08/27/2012 1435   EOSABS 0.1 08/27/2012 1435   BASOSABS 0.0 08/27/2012 1435     Treatments: surgery: laparoscopic cholecystectomy   Discharge Exam: Blood pressure 122/57, pulse 75, temperature 99 F (37.2 C), temperature source Oral, resp. rate 16, height 5\' 4"  (1.626 m), weight 183 lb 4.8 oz (83.144 kg), last menstrual period 11/26/2010, SpO2 98.00%. Incision/Wound:clean dry intact.  Disposition: Final discharge disposition not confirmed  Discharge Orders   Future Appointments Provider Department Dept Phone   09/23/2012 1:30 PM Emelia Loron, MD [facility], [place] [phone]   10/05/2012 9:30 AM [facility] AT [facility] [phone]   Patient to arrive 15 minutes prior to appointment time. Patient to pick up oral contrast at least 1 day prior to exam. No solid food 4 hours prior to exam. Liquids and Medicines are okay.   10/13/2012 12:00 PM Kerin Perna, MD [facility] [phone]   Future Orders Complete By Expires     Diet - low sodium heart healthy  As directed     Increase activity slowly  As directed          Medication List    TAKE these medications       ALPRAZolam 0.5 MG tablet  Commonly known as:  XANAX  Take 0.5 mg by mouth at bedtime as needed (panic attack).     oxyCODONE-acetaminophen 5-325 MG per tablet  Commonly known as:  ROXICET  Take 1 tablet by mouth every 4 (four) hours as needed for pain.     oxyCODONE-acetaminophen 5-325 MG per tablet  Commonly known as:  ROXICET  Take 1 tablet by mouth every 4 (four) hours as needed for pain.     promethazine 25 MG tablet  Commonly known as:  PHENERGAN  Take 25 mg by mouth every 8 (eight) hours as needed for nausea.     Vitamin D 2000 UNITS Caps  Take 2,000 Units by mouth daily.     WELLBUTRIN XL 150 MG 24 hr tablet  Generic drug:  buPROPion  Take 150 mg by mouth daily before breakfast.           Follow-up Information   Follow up with [facility], MD In 3 weeks.   Contact information:   [address] [place] [address] [phone]       Signed: Dortha Schwalbe. 09/02/2012, 7:22 AM

## 2012-09-02 NOTE — Progress Notes (Signed)
Pt voiding adequately.  Discharge instructions reviewed with pt and prescription given.  Pt verbalized understanding and had no questions.  Pt discharged in stable condition via wheelchair with family.  Hector Shade [place]

## 2012-09-02 NOTE — Progress Notes (Signed)
2 Days Post-Op  Subjective: sore  Objective: Vital signs in last 24 hours: Temp:  [97.5 F (36.4 C)-99 F (37.2 C)] 99 F (37.2 C) (03/06 0534) Pulse Rate:  [59-75] 75 (03/06 0534) Resp:  [16-17] 16 (03/06 0534) BP: (110-122)/(56-61) 122/57 mmHg (03/06 0534) SpO2:  [98 %-99 %] 98 % (03/06 0534) Last BM Date: 08/30/12  Intake/Output from previous day: 03/05 0701 - 03/06 0700 In: 1687.5 [I.V.:1687.5] Out: 2100 [Urine:2100] Intake/Output this shift:    Incision/Wound:clean dry intact.  Sore around port sites  Lab Results:   Recent Labs  09/01/12 1155  WBC 8.1  HGB 11.4*  HCT 32.7*  PLT 241   BMET No results found for this basename: NA, K, CL, CO2, GLUCOSE, BUN, CREATININE, CALCIUM,  in the last 72 hours PT/INR No results found for this basename: LABPROT, INR,  in the last 72 hours ABG No results found for this basename: PHART, PCO2, PO2, HCO3,  in the last 72 hours  Studies/Results: No results found.  Anti-infectives: Anti-infectives   Start     Dose/Rate Route Frequency Ordered Stop   08/31/12 0600  cefOXitin (MEFOXIN) 2 g in dextrose 5 % 50 mL IVPB     2 g 100 mL/hr over 30 Minutes Intravenous On call to O.R. 08/30/12 1434 08/31/12 1325      Assessment/Plan: s/p Procedure(s): LAPAROSCOPIC CHOLECYSTECTOMY possible IOC  (N/A) d/c foley Discharge if pt cannot void,  replace foley and discharge with leg bag.  LOS: 2 days    CORNETT,THOMAS A. 09/02/2012

## 2012-09-03 ENCOUNTER — Ambulatory Visit (INDEPENDENT_AMBULATORY_CARE_PROVIDER_SITE_OTHER): Payer: Self-pay | Admitting: Surgery

## 2012-09-22 ENCOUNTER — Ambulatory Visit: Payer: Managed Care, Other (non HMO) | Admitting: Cardiothoracic Surgery

## 2012-09-23 ENCOUNTER — Ambulatory Visit (INDEPENDENT_AMBULATORY_CARE_PROVIDER_SITE_OTHER): Payer: Managed Care, Other (non HMO) | Admitting: General Surgery

## 2012-09-23 ENCOUNTER — Encounter (INDEPENDENT_AMBULATORY_CARE_PROVIDER_SITE_OTHER): Payer: Self-pay | Admitting: General Surgery

## 2012-09-23 VITALS — BP 130/84 | HR 72 | Temp 97.2°F | Resp 18 | Ht 64.0 in | Wt 177.0 lb

## 2012-09-23 DIAGNOSIS — Z09 Encounter for follow-up examination after completed treatment for conditions other than malignant neoplasm: Secondary | ICD-10-CM

## 2012-09-23 NOTE — Progress Notes (Signed)
Subjective:     Patient ID: Kayla Romero, female   DOB: June 08, 1956, 57 y.o.   MRN: [id number]  HPI This is a 57 year old female I did a laparoscopic cholecystectomy for biliary dyskinesia. She has done well and her symptoms have all gone away postoperatively. She's had no trouble after her surgery. Pathology shows benign gallbladder with mild chronic inflammation. She returns today for followup.  Review of Systems     Objective:   Physical Exam Well healed incisions without infection    Assessment:     S/p lap chole     Plan:     She is doing very well. She can return to normal activity. I told her she can eat whatever she wants at this point and followup with me as needed.

## 2012-10-05 ENCOUNTER — Other Ambulatory Visit: Payer: Managed Care, Other (non HMO)

## 2012-10-05 ENCOUNTER — Ambulatory Visit
Admission: RE | Admit: 2012-10-05 | Discharge: 2012-10-05 | Disposition: A | Discharge status: Home / Self Care | Payer: Managed Care, Other (non HMO) | Source: Ambulatory Visit | Attending: Specialist | Admitting: Specialist

## 2012-10-05 DIAGNOSIS — R911 Solitary pulmonary nodule: Secondary | ICD-10-CM

## 2012-10-13 ENCOUNTER — Ambulatory Visit: Payer: Managed Care, Other (non HMO) | Admitting: Cardiothoracic Surgery

## 2012-10-20 ENCOUNTER — Encounter: Payer: Self-pay | Admitting: Cardiothoracic Surgery

## 2012-10-20 ENCOUNTER — Ambulatory Visit (INDEPENDENT_AMBULATORY_CARE_PROVIDER_SITE_OTHER): Payer: Managed Care, Other (non HMO) | Admitting: Cardiothoracic Surgery

## 2012-10-20 VITALS — BP 127/79 | HR 75 | Resp 19 | Ht 64.0 in | Wt 183.0 lb

## 2012-10-20 DIAGNOSIS — J9859 Other diseases of mediastinum, not elsewhere classified: Secondary | ICD-10-CM

## 2012-10-20 DIAGNOSIS — R222 Localized swelling, mass and lump, trunk: Secondary | ICD-10-CM

## 2012-10-20 NOTE — Progress Notes (Signed)
PCP is Redmond Baseman, MD Referring Provider is Ileana Ladd, MD  Chief Complaint  Kayla Romero presents with  . Follow-up    1 year f/u with CT scan, following mediastinal nodule    HPI: Prior Kayla Romero of Dr. 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 Measurements approximately 2.2 cm. Kayla Romero is currently smoking secondary to high stress at work-customer service representative for [employer] She rarely has any symptoms-sometimes she has some slight burning sensation in her  upper anterior chest. There no abnormal nodes or pulmonary masses noted.  CT scan today is reviewed which shows no change from the previous study 2013. Past Medical History  Diagnosis Date  . RUQ pain   . History of EKG     first presented /w L shoulder pain, 2007, seen by [facility] , had EKG then & it was wnl.    . Fatty liver   . Anxiety     h/o, recently has not had to use xanax much.  . Depression   . Cyst of thymus gland     being monitored  . Thymus, cyst     first seen on 2007, had some care with Dr. Edwyna Shell & then a Octavio Manns MD fr. [facility]    . Thymus, cyst     diagnosed approx. 2007, seen by Dr. Edwyna Shell, but now followed by [facility] in [place]    Past Surgical History  Procedure Laterality Date  . Tubal ligation  1988  . Eye surgery  2008    bilateral removal of cataracts, /w IOL  . Cholecystectomy N/A 08/31/2012    Procedure: LAPAROSCOPIC CHOLECYSTECTOMY possible IOC ;  Surgeon: Emelia Loron, MD;  Location: [facility] OR;  Service: General;  Laterality: N/A;    Family History  Problem Relation Age of Onset  . Heart disease Mother   . Cirrhosis Mother   . Kidney disease Mother   . Cancer Mother     ovarian  . Aneurysm Father     Social History History  Substance Use Topics  . Smoking status: Former Smoker    Types: Cigarettes    Quit date: 08/20/2010   . Smokeless tobacco: Never Used  . Alcohol Use: No    Current Outpatient Prescriptions  Medication Sig Dispense Refill  . ALPRAZolam (XANAX) 0.5 MG tablet Take 0.5 mg by mouth at bedtime as needed (panic attack).       Marland Kitchen buPROPion (WELLBUTRIN XL) 150 MG 24 hr tablet Take 150 mg by mouth daily before breakfast.       . Cholecalciferol (VITAMIN D) 2000 UNITS CAPS Take 2,000 Units by mouth daily.        No current facility-administered medications for this visit.    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 No changes over 7 years represents strong evidence for a benign etiology.   Impression: Discussed surgical resection would require probable sternotomy and Kayla Romero would not wish surgery unless there is a significant risk  of malignancy. At this point I would say that the risk of malignancy is very low.  Plan continue followup repeat CT scan in 18 months. She'll let us know of any symptoms arise

## 2013-04-29 ENCOUNTER — Other Ambulatory Visit: Payer: Self-pay | Admitting: *Deleted

## 2013-04-29 DIAGNOSIS — J9859 Other diseases of mediastinum, not elsewhere classified: Secondary | ICD-10-CM

## 2013-05-18 ENCOUNTER — Ambulatory Visit: Payer: Managed Care, Other (non HMO) | Admitting: Cardiothoracic Surgery

## 2013-05-18 ENCOUNTER — Other Ambulatory Visit: Payer: Managed Care, Other (non HMO)

## 2013-07-18 ENCOUNTER — Encounter: Payer: Self-pay | Admitting: Family Medicine

## 2013-07-18 ENCOUNTER — Ambulatory Visit (INDEPENDENT_AMBULATORY_CARE_PROVIDER_SITE_OTHER): Payer: Managed Care, Other (non HMO) | Admitting: Family Medicine

## 2013-07-18 VITALS — BP 116/76 | HR 66 | Temp 97.1°F | Ht 64.0 in | Wt 193.6 lb

## 2013-07-18 DIAGNOSIS — F32A Depression, unspecified: Secondary | ICD-10-CM | POA: Insufficient documentation

## 2013-07-18 DIAGNOSIS — K760 Fatty (change of) liver, not elsewhere classified: Secondary | ICD-10-CM | POA: Insufficient documentation

## 2013-07-18 DIAGNOSIS — K7689 Other specified diseases of liver: Secondary | ICD-10-CM

## 2013-07-18 DIAGNOSIS — E669 Obesity, unspecified: Secondary | ICD-10-CM | POA: Insufficient documentation

## 2013-07-18 DIAGNOSIS — F329 Major depressive disorder, single episode, unspecified: Secondary | ICD-10-CM | POA: Insufficient documentation

## 2013-07-18 DIAGNOSIS — F411 Generalized anxiety disorder: Secondary | ICD-10-CM

## 2013-07-18 DIAGNOSIS — F419 Anxiety disorder, unspecified: Secondary | ICD-10-CM

## 2013-07-18 DIAGNOSIS — E559 Vitamin D deficiency, unspecified: Secondary | ICD-10-CM | POA: Insufficient documentation

## 2013-07-18 MED ORDER — ALPRAZOLAM 0.5 MG PO TABS: 0.5000 mg | ORAL_TABLET | Freq: Every evening | ORAL | Status: DC | PRN

## 2013-07-18 NOTE — Patient Instructions (Signed)
      Dr Regene Mccarthy's Recommendations  For nutrition information, I recommend books:  1).Eat to Live by Dr Joel Fuhrman. 2).Prevent and Reverse Heart Disease by Dr Caldwell Esselstyn. 3) Dr Neal Barnard's Book:  Program to Reverse Diabetes  Exercise recommendations are:  If unable to walk, then the patient can exercise in a chair 3 times a day. By flapping arms like a bird gently and raising legs outwards to the front.  If ambulatory, the patient can go for walks for 30 minutes 3 times a week. Then increase the intensity and duration as tolerated.  Goal is to try to attain exercise frequency to 5 times a week.  If applicable: Best to perform resistance exercises (machines or weights) 2 days a week and cardio type exercises 3 days per week.  

## 2013-07-18 NOTE — Progress Notes (Signed)
Patient ID: Kayla Romero, female   DOB: 01-05-1956, 58 y.o.   MRN: [id number] SUBJECTIVE: CC: Chief Complaint  Patient presents with  . Follow-up    states has had cholectectomy on March 2014. needs xanax refilledf    HPI:  Came for a follow up and recheck on fatty liver and vitamin D deficiency. Lost 6 to 7 lbs  Since  December. Has removed  Sodas from diet. Trying to eat better.  Needs a few xanax on hand for panic feelings once in a while.  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    Past Medical History  Diagnosis Date  . RUQ pain   . History of EKG     first presented /w L shoulder pain, 2007, seen by [place]. , had EKG then & it was wnl.    . Fatty liver   . Cyst of thymus gland     being monitored  . Thymus, cyst     first seen on 2007, had some care with Dr. Arlyce Dice & then a Angelina Sheriff MD fr. [facility]    . Thymus, cyst     diagnosed approx. 2007, seen by Dr. Arlyce Dice, but now followed by [facility] in [place]  . Anxiety     h/o, recently has not had to use xanax much.  . Depression   . Obesity (BMI 30-39.9)   . Vitamin D deficiency    Past Surgical History  Procedure Laterality Date  . Tubal ligation  1988  . Eye surgery  2008    bilateral removal of cataracts, /w IOL  . Cholecystectomy N/A 08/31/2012    Procedure: LAPAROSCOPIC CHOLECYSTECTOMY possible IOC ;  Surgeon: Rolm Bookbinder, MD;  Location: [facility] OR;  Service: General;  Laterality: N/A;   History   Social History  . Marital Status: Married    Spouse Name: N/A    Number of Children: N/A  . Years of Education: N/A   Occupational History  . Not on file.   Social History Main Topics  . Smoking status: Current Every Day Smoker    Types: Cigarettes    Last Attempt to Quit: 08/20/2010  . Smokeless tobacco: Never Used     Comment: quit 2 weeks ago completely. had quit in 2012 but restarted  . Alcohol Use: No  . Drug Use: No  . Sexual Activity: Not on file   Other Topics Concern  . Not on file    Social History Narrative  . No narrative on file   Family History  Problem Relation Age of Onset  . Heart disease Mother   . Cirrhosis Mother   . Kidney disease Mother   . Cancer Mother     ovarian  . Aneurysm Father    Current Outpatient Prescriptions on File Prior to Visit  Medication Sig Dispense Refill  . buPROPion (WELLBUTRIN XL) 150 MG 24 hr tablet Take 150 mg by mouth daily before breakfast.       . Cholecalciferol (VITAMIN D) 2000 UNITS CAPS Take 2,000 Units by mouth daily.        No current facility-administered medications on file prior to visit.   No Known Allergies Immunization History  Administered Date(s) Administered  . Influenza-Unspecified 04/30/2013   Prior to Admission medications   Medication Sig Start Date End Date Taking? Authorizing Provider  ALPRAZolam Duanne Moron) 0.5 MG tablet Take 0.5 mg by mouth at bedtime as needed (panic attack).    Yes Historical Provider, MD  buPROPion (WELLBUTRIN XL)  150 MG 24 hr tablet Take 150 mg by mouth daily before breakfast.    Yes Historical Provider, MD  Cholecalciferol (VITAMIN D) 2000 UNITS CAPS Take 2,000 Units by mouth daily.    Yes Historical Provider, MD     ROS: As above in the HPI. All other systems are stable or negative.  OBJECTIVE: APPEARANCE:  Patient in no acute distress.The patient appeared well nourished and normally developed. Acyanotic. Waist: VITAL SIGNS:BP 116/76  Pulse 66  Temp(Src) 97.1 F (36.2 C) (Oral)  Ht 5' 4" (1.626 m)  Wt 193 lb 9.6 oz (87.816 kg)  BMI 33.21 kg/m2  LMP 11/26/2010 Overweight WF  SKIN: warm and  Dry without overt rashes, tattoos and scars  HEAD and Neck: without JVD, Head and scalp: normal Eyes:No scleral icterus. Fundi normal, eye movements normal. Ears: Auricle normal, canal normal, Tympanic membranes normal, insufflation normal. Nose: normal Throat: normal Neck & thyroid: normal  CHEST & LUNGS: Chest wall: normal Lungs: Clear  CVS: Reveals the PMI to  be normally located. Regular rhythm, First and Second Heart sounds are normal,  absence of murmurs, rubs or gallops. Peripheral vasculature: Radial pulses: normal Dorsal pedis pulses: normal Posterior pulses: normal  ABDOMEN:  Appearance: obese Benign, no organomegaly, no masses, no Abdominal Aortic enlargement. No Guarding , no rebound. No Bruits. Bowel sounds: normal  RECTAL: N/A GU: N/A  EXTREMETIES: nonedematous.  MUSCULOSKELETAL:  Spine: normal Joints: intact  NEUROLOGIC: oriented to time,place and person; nonfocal.  Results for orders placed during the hospital encounter of 08/31/12  CBC      Result Value Range   WBC 8.1  4.0 - 10.5 K/uL   RBC 3.69 (*) 3.87 - 5.11 MIL/uL   Hemoglobin 11.4 (*) 12.0 - 15.0 g/dL   HCT 32.7 (*) 36.0 - 46.0 %   MCV 88.6  78.0 - 100.0 fL   MCH 30.9  26.0 - 34.0 pg   MCHC 34.9  30.0 - 36.0 g/dL   RDW 12.9  11.5 - 15.5 %   Platelets 241  150 - 400 K/uL  CBC      Result Value Range   WBC 8.4  4.0 - 10.5 K/uL   RBC 3.92  3.87 - 5.11 MIL/uL   Hemoglobin 12.1  12.0 - 15.0 g/dL   HCT 34.3 (*) 36.0 - 46.0 %   MCV 87.5  78.0 - 100.0 fL   MCH 30.9  26.0 - 34.0 pg   MCHC 35.3  30.0 - 36.0 g/dL   RDW 13.0  11.5 - 15.5 %   Platelets 244  150 - 400 K/uL    ASSESSMENT:  Fatty liver - Plan: CMP14+EGFR, NMR, lipoprofile  Obesity (BMI 30-39.9) - Plan: Vit D  25 hydroxy (rtn osteoporosis monitoring)  Anxiety - Plan: ALPRAZolam (XANAX) 0.5 MG tablet  Vitamin D deficiency - Plan: Vit D  25 hydroxy (rtn osteoporosis monitoring)  PLAN:      Dr Paula Libra Recommendations  For nutrition information, I recommend books:  1).Eat to Live by Dr Excell Seltzer. 2).Prevent and Reverse Heart Disease by Dr Karl Luke. 3) Dr Janene Harvey Book:  Program to Reverse Diabetes  Exercise recommendations are:  If unable to walk, then the patient can exercise in a chair 3 times a day. By flapping arms like a bird gently and raising legs  outwards to the front.  If ambulatory, the patient can go for walks for 30 minutes 3 times a week. Then increase the intensity and duration as tolerated.  Goal is to try to attain exercise frequency to 5 times a week.  If applicable: Best to perform resistance exercises (machines or weights) 2 days a week and cardio type exercises 3 days per week.  Orders Placed This Encounter  Procedures  . CMP14+EGFR  . NMR, lipoprofile  . Vit D  25 hydroxy (rtn osteoporosis monitoring)   Meds ordered this encounter  Medications  . ALPRAZolam (XANAX) 0.5 MG tablet    Sig: Take 1 tablet (0.5 mg total) by mouth at bedtime as needed (panic attack).    Dispense:  20 tablet    Refill:  0   Medications Discontinued During This Encounter  Medication Reason  . ALPRAZolam (XANAX) 0.5 MG tablet Reorder   Return in about 3 months (around 10/16/2013) for Recheck medical problems.  Francis P. Jacelyn Grip, M.D.

## 2013-07-20 LAB — NMR, LIPOPROFILE
Cholesterol: 141 mg/dL (ref <200)
HDL Cholesterol by NMR: 41 mg/dL (ref >=40)
HDL Particle Number: 30.6 umol/L (ref >=30.5)
LDL Particle Number: 879 nmol/L (ref <1000)
LDL Size: 20.2 nm — ABNORMAL LOW (ref >20.5)
LDLC SERPL CALC-MCNC: 76 mg/dL (ref <100)
LP-IR Score: 52 — ABNORMAL HIGH (ref <=45)
Small LDL Particle Number: 399 nmol/L (ref <=527)
Triglycerides by NMR: 120 mg/dL (ref <150)

## 2013-07-20 LAB — CMP14+EGFR
ALT: 17 IU/L (ref 0–32)
AST: 17 IU/L (ref 0–40)
Albumin/Globulin Ratio: 2 (ref 1.1–2.5)
Albumin: 4.4 g/dL (ref 3.5–5.5)
Alkaline Phosphatase: 103 IU/L (ref 39–117)
BUN/Creatinine Ratio: 15 (ref 9–23)
BUN: 13 mg/dL (ref 6–24)
CO2: 21 mmol/L (ref 18–29)
Calcium: 9.1 mg/dL (ref 8.7–10.2)
Chloride: 101 mmol/L (ref 97–108)
Creatinine, Ser: 0.85 mg/dL (ref 0.57–1.00)
GFR calc Af Amer: 89 mL/min/{1.73_m2} (ref >59)
GFR calc non Af Amer: 77 mL/min/{1.73_m2} (ref >59)
Globulin, Total: 2.2 g/dL (ref 1.5–4.5)
Glucose: 129 mg/dL — ABNORMAL HIGH (ref 65–99)
Potassium: 3.8 mmol/L (ref 3.5–5.2)
Sodium: 139 mmol/L (ref 134–144)
Total Bilirubin: 0.3 mg/dL (ref 0.0–1.2)
Total Protein: 6.6 g/dL (ref 6.0–8.5)

## 2013-07-20 LAB — VITAMIN D 25 HYDROXY (VIT D DEFICIENCY, FRACTURES): Vit D, 25-Hydroxy: 39.5 ng/mL (ref 30.0–100.0)

## 2013-07-26 ENCOUNTER — Telehealth: Payer: Self-pay | Admitting: *Deleted

## 2013-07-26 NOTE — Telephone Encounter (Signed)
Message copied by Priscille Heidelberg on Tue Jul 26, 2013 10:05 AM ------      Message from: Vernie Shanks      Created: Sun Jul 24, 2013  9:16 PM       Call Patient      Lab result at or close to goal.sugar is a little high. Will need to recheck at next visit.      Plant based  Diet.      No change in Medications for now.      No Change in recommendations.      No change in plans for follow up. ------

## 2013-08-30 ENCOUNTER — Other Ambulatory Visit: Payer: Self-pay | Admitting: Family Medicine

## 2013-09-05 ENCOUNTER — Other Ambulatory Visit: Payer: Self-pay | Admitting: Family Medicine

## 2013-09-06 NOTE — Telephone Encounter (Signed)
Last seen 07/18/13 FPW  Requesting 90 day supply

## 2013-09-06 NOTE — Telephone Encounter (Signed)
Call patient : Prescription refilled & sent to pharmacy in [facility]. 

## 2013-10-18 ENCOUNTER — Encounter: Payer: Self-pay | Admitting: Family Medicine

## 2013-10-18 ENCOUNTER — Ambulatory Visit (INDEPENDENT_AMBULATORY_CARE_PROVIDER_SITE_OTHER): Payer: Managed Care, Other (non HMO) | Admitting: Family Medicine

## 2013-10-18 VITALS — BP 125/75 | HR 66 | Temp 97.2°F | Ht 64.0 in | Wt 193.0 lb

## 2013-10-18 DIAGNOSIS — E669 Obesity, unspecified: Secondary | ICD-10-CM

## 2013-10-18 DIAGNOSIS — K760 Fatty (change of) liver, not elsewhere classified: Secondary | ICD-10-CM

## 2013-10-18 DIAGNOSIS — F419 Anxiety disorder, unspecified: Secondary | ICD-10-CM

## 2013-10-18 DIAGNOSIS — R7309 Other abnormal glucose: Secondary | ICD-10-CM

## 2013-10-18 DIAGNOSIS — R739 Hyperglycemia, unspecified: Secondary | ICD-10-CM

## 2013-10-18 DIAGNOSIS — F411 Generalized anxiety disorder: Secondary | ICD-10-CM

## 2013-10-18 DIAGNOSIS — E559 Vitamin D deficiency, unspecified: Secondary | ICD-10-CM

## 2013-10-18 DIAGNOSIS — K7689 Other specified diseases of liver: Secondary | ICD-10-CM

## 2013-10-18 LAB — POCT GLYCOSYLATED HEMOGLOBIN (HGB A1C): Hemoglobin A1C: 5.2

## 2013-10-18 NOTE — Patient Instructions (Addendum)
Fatty Liver °Fatty liver is the accumulation of fat in liver cells. It is also called hepatosteatosis or steatohepatitis. It is normal for your liver to contain some fat. If fat is more than 5 to 10% of your liver's weight, you have fatty liver.  °There are often no symptoms (problems) for years while damage is still occurring. People often learn about their fatty liver when they have medical tests for other reasons. Fat can damage your liver for years or even decades without causing problems. When it becomes severe, it can cause fatigue, weight loss, weakness, and confusion. °This makes you more likely to develop more serious liver problems. The liver is the largest organ in the body. It does a lot of work and often gives no warning signs when it is sick until late in a disease. °The liver has many important jobs including: °· Breaking down foods. °· Storing vitamins, iron, and other minerals. °· Making proteins. °· Making bile for food digestion. °· Breaking down many products including medications, alcohol and some poisons. °CAUSES  °There are a number of different conditions, medications, and poisons that can cause a fatty liver. Eating too many calories causes fat to build up in the liver. Not processing and breaking fats down normally may also cause this. Certain conditions, such as obesity, diabetes, and high triglycerides also cause this. Most fatty liver patients tend to be middle-aged and over weight.  °Some causes of fatty liver are: °· Alcohol over consumption. °· Malnutrition. °· Steroid use. °· Valproic acid toxicity. °· Obesity. °· Cushing's syndrome. °· Poisons. °· Tetracycline in high dosages. °· Pregnancy. °· Diabetes. °· Hyperlipidemia. °· Rapid weight loss. °Some people develop fatty liver even having none of these conditions. °SYMPTOMS  °Fatty liver most often causes no problems. This is called asymptomatic. °· It can be diagnosed with blood tests and also by a liver biopsy. °· It is one of the  most common causes of minor elevations of liver enzymes on routine blood tests. °· Specialized Imaging of the liver using ultrasound, CT (computed tomography) scan, or MRI (magnetic resonance imaging) can suggest a fatty liver but a biopsy is needed to confirm it. °· A biopsy involves taking a small sample of liver tissue. This is done by using a needle. It is then looked at under a microscope by a specialist. °TREATMENT  °It is important to treat the cause. Simple fatty liver without a medical reason may not need treatment. °· Weight loss, fat restriction, and exercise in overweight patients produces inconsistent results but is worth trying. °· Fatty liver due to alcohol toxicity may not improve even with stopping drinking. °· Good control of diabetes may reduce fatty liver. °· Lower your triglycerides through diet, medication or both. °· Eat a balanced, healthy diet. °· Increase your physical activity. °· Get regular checkups from a liver specialist. °· There are no medical or surgical treatments for a fatty liver or NASH, but improving your diet and increasing your exercise may help prevent or reverse some of the damage. °PROGNOSIS  °Fatty liver may cause no damage or it can lead to an inflammation of the liver. This is, called steatohepatitis. When it is linked to alcohol abuse, it is called alcoholic steatohepatitis. It often is not linked to alcohol. It is then called nonalcoholic steatohepatitis, or NASH. Over time the liver may become scarred and hardened. This condition is called cirrhosis. Cirrhosis is serious and may lead to liver failure or cancer. NASH is one of the   leading causes of cirrhosis. About 10-20% of Americans have fatty liver and a smaller 2-5% has NASH. °Document Released: 08/01/2005 Document Revised: 09/08/2011 Document Reviewed: 09/24/2005 °ExitCare® Patient Information ©2014 [employer]. ° °

## 2013-10-18 NOTE — Progress Notes (Signed)
Patient ID: Kayla Romero, female   DOB: 1955/09/13, 58 y.o.   MRN: [id number] SUBJECTIVE: CC: Chief Complaint  Patient presents with  . Follow-up    3 month follow up need FMLA renewed     HPI: Here for follow up on fatty liver. Has right thoracic pain on rotation. Chest wall musculoskeletal in origin.  Past Medical History  Diagnosis Date  . RUQ pain   . History of EKG     first presented /w L shoulder pain, 2007, seen by [place]. , had EKG then & it was wnl.    . Fatty liver   . Cyst of thymus gland     being monitored  . Thymus, cyst     first seen on 2007, had some care with Dr. Arlyce Dice & then a Angelina Sheriff MD fr. [facility]    . Thymus, cyst     diagnosed approx. 2007, seen by Dr. Arlyce Dice, but now followed by [facility] in [place]  . Anxiety     h/o, recently has not had to use xanax much.  . Depression   . Obesity (BMI 30-39.9)   . Vitamin D deficiency    Past Surgical History  Procedure Laterality Date  . Tubal ligation  1988  . Eye surgery  2008    bilateral removal of cataracts, /w IOL  . Cholecystectomy N/A 08/31/2012    Procedure: LAPAROSCOPIC CHOLECYSTECTOMY possible IOC ;  Surgeon: Rolm Bookbinder, MD;  Location: [facility] OR;  Service: General;  Laterality: N/A;   History   Social History  . Marital Status: Married    Spouse Name: N/A    Number of Children: N/A  . Years of Education: N/A   Occupational History  . Not on file.   Social History Main Topics  . Smoking status: Current Every Day Smoker    Types: Cigarettes    Last Attempt to Quit: 08/20/2010  . Smokeless tobacco: Never Used     Comment: quit 2 weeks ago completely. had quit in 2012 but restarted  . Alcohol Use: No  . Drug Use: No  . Sexual Activity: Not on file   Other Topics Concern  . Not on file   Social History Narrative  . No narrative on file   Family History  Problem Relation Age of Onset  . Heart disease Mother   . Cirrhosis Mother   . Kidney disease Mother    . Cancer Mother     ovarian  . Aneurysm Father    Current Outpatient Prescriptions on File Prior to Visit  Medication Sig Dispense Refill  . ALPRAZolam (XANAX) 0.5 MG tablet Take 1 tablet (0.5 mg total) by mouth at bedtime as needed (panic attack).  20 tablet  0  . buPROPion (WELLBUTRIN XL) 150 MG 24 hr tablet TAKE 1 TABLET BY EVERY MORNING  90 tablet  0  . Cholecalciferol (VITAMIN D) 2000 UNITS CAPS Take 2,000 Units by mouth daily.        No current facility-administered medications on file prior to visit.   No Known Allergies Immunization History  Administered Date(s) Administered  . Influenza-Unspecified 04/30/2013   Prior to Admission medications   Medication Sig Start Date End Date Taking? Authorizing Provider  ALPRAZolam Duanne Moron) 0.5 MG tablet Take 1 tablet (0.5 mg total) by mouth at bedtime as needed (panic attack). 07/18/13  Yes Vernie Shanks, MD  buPROPion (WELLBUTRIN XL) 150 MG 24 hr tablet TAKE 1 TABLET BY EVERY MORNING  Yes Vernie Shanks, MD  Cholecalciferol (VITAMIN D) 2000 UNITS CAPS Take 2,000 Units by mouth daily.    Yes Historical Provider, MD     ROS: As above in the HPI. All other systems are stable or negative.  OBJECTIVE: APPEARANCE:  Patient in no acute distress.The patient appeared well nourished and normally developed. Acyanotic. Waist: VITAL SIGNS:BP 125/75  Pulse 66  Temp(Src) 97.2 F (36.2 C) (Oral)  Ht _0  (1.626 m)  Wt 193 lb (87.544 kg)  BMI 33.11 kg/m2  LMP 11/26/2010  Obese WF SKIN: warm and  Dry without overt rashes, tattoos and scars  HEAD and Neck: without JVD, Head and scalp: normal Eyes:No scleral icterus. Fundi normal, eye movements normal. Ears: Auricle normal, canal normal, Tympanic membranes normal, insufflation normal. Nose: normal Throat: normal Neck & thyroid: normal  CHEST & LUNGS: Chest wall: right lower chest wall tender to palpation and pain aggravated by twisting Lungs: Clear  CVS: Reveals the PMI to be  normally located. Regular rhythm, First and Second Heart sounds are normal,  absence of murmurs, rubs or gallops. Peripheral vasculature: Radial pulses: normal Dorsal pedis pulses: normal Posterior pulses: normal  ABDOMEN:  Appearance: obese Benign, no organomegaly, no masses, no Abdominal Aortic enlargement. No Guarding , no rebound. No Bruits. Bowel sounds: normal  RECTAL: N/A GU: N/A  EXTREMETIES: nonedematous.  MUSCULOSKELETAL:  Spine: normal Joints: intact  NEUROLOGIC: oriented to time,place and person; nonfocal. Strength is normal Sensory is normal Reflexes are normal Cranial Nerves are normal.  ASSESSMENT: Obesity (BMI 30-39.9)  Fatty liver - Plan: CMP14+EGFR  Vitamin D deficiency - Plan: Vit D  25 hydroxy (rtn osteoporosis monitoring)  Anxiety  Hyperglycemia - Plan: POCT glycosylated hemoglobin (Hb A1C)  PLAN: Demonstrated twisting and stretching exercises for the chest wall pain. Weight reduction. Plant based  Diet Handout on fatty liver.  Orders Placed This Encounter  Procedures  . CMP14+EGFR  . Vit D  25 hydroxy (rtn osteoporosis monitoring)  . POCT glycosylated hemoglobin (Hb A1C)   No orders of the defined types were placed in this encounter.   There are no discontinued medications. Return in about 4 months (around 02/17/2014) for Recheck medical problems.  Dayne Dekay P. Jacelyn Grip, M.D.

## 2013-10-19 LAB — CMP14+EGFR
ALT: 32 IU/L (ref 0–32)
AST: 22 IU/L (ref 0–40)
Albumin/Globulin Ratio: 1.7 (ref 1.1–2.5)
Albumin: 4.4 g/dL (ref 3.5–5.5)
Alkaline Phosphatase: 132 IU/L — ABNORMAL HIGH (ref 39–117)
BUN/Creatinine Ratio: 22 (ref 9–23)
BUN: 17 mg/dL (ref 6–24)
CO2: 24 mmol/L (ref 18–29)
Calcium: 9.2 mg/dL (ref 8.7–10.2)
Chloride: 106 mmol/L (ref 97–108)
Creatinine, Ser: 0.79 mg/dL (ref 0.57–1.00)
GFR calc Af Amer: 97 mL/min/{1.73_m2} (ref >59)
GFR calc non Af Amer: 84 mL/min/{1.73_m2} (ref >59)
Globulin, Total: 2.6 g/dL (ref 1.5–4.5)
Glucose: 88 mg/dL (ref 65–99)
Potassium: 4.3 mmol/L (ref 3.5–5.2)
Sodium: 142 mmol/L (ref 134–144)
Total Bilirubin: 0.3 mg/dL (ref 0.0–1.2)
Total Protein: 7 g/dL (ref 6.0–8.5)

## 2013-10-19 LAB — VITAMIN D 25 HYDROXY (VIT D DEFICIENCY, FRACTURES): Vit D, 25-Hydroxy: 37.8 ng/mL (ref 30.0–100.0)

## 2013-10-23 ENCOUNTER — Other Ambulatory Visit: Payer: Self-pay | Admitting: Family Medicine

## 2013-10-23 DIAGNOSIS — R748 Abnormal levels of other serum enzymes: Secondary | ICD-10-CM

## 2013-10-23 NOTE — Progress Notes (Signed)
Quick Note:  Call Patient Labs that are abnormal: One liver enzyme is elevated The rest are at goal or normal.  Recommendations: Repeat the alk Phos in 4 weeks. Avoid tylenol , NSAIDS and alcohol over the next 4 weeks. Test ordered in [facility]   ______

## 2013-10-24 ENCOUNTER — Telehealth: Payer: Self-pay | Admitting: Family Medicine

## 2013-10-24 NOTE — Telephone Encounter (Signed)
Do you know about Fmla paper work? Or does gina have it

## 2013-10-24 NOTE — Telephone Encounter (Signed)
See note associated with the labs. Copy labs for patient. She needs additional lab work. Also, I had done her FMLA forms and left it in [place] office.

## 2013-10-25 NOTE — Telephone Encounter (Signed)
Spoke with patient about lab results and recommendations. Forwarded call to Glean Salen to discuss FMLA papers.

## 2013-10-25 NOTE — Telephone Encounter (Signed)
Faxed FMLA today, pt aware

## 2013-11-14 ENCOUNTER — Encounter: Payer: Self-pay | Admitting: Family Medicine

## 2013-11-14 ENCOUNTER — Ambulatory Visit (INDEPENDENT_AMBULATORY_CARE_PROVIDER_SITE_OTHER): Payer: Managed Care, Other (non HMO) | Admitting: Family Medicine

## 2013-11-14 VITALS — BP 127/71 | HR 63 | Temp 99.0°F | Ht 64.0 in | Wt 192.0 lb

## 2013-11-14 DIAGNOSIS — J069 Acute upper respiratory infection, unspecified: Secondary | ICD-10-CM

## 2013-11-14 DIAGNOSIS — R748 Abnormal levels of other serum enzymes: Secondary | ICD-10-CM

## 2013-11-14 DIAGNOSIS — H109 Unspecified conjunctivitis: Secondary | ICD-10-CM

## 2013-11-14 MED ORDER — AZITHROMYCIN 250 MG PO TABS: ORAL_TABLET | ORAL | Status: DC

## 2013-11-14 MED ORDER — TRIAMCINOLONE ACETONIDE 40 MG/ML IJ SUSP
60.0000 mg | Freq: Once | INTRAMUSCULAR | Status: AC
  Administered 2013-11-14: 60 mg via INTRAMUSCULAR

## 2013-11-14 MED ORDER — POLYMYXIN B-TRIMETHOPRIM 10000-0.1 UNIT/ML-% OP SOLN: 1.0000 [drp] | OPHTHALMIC | Status: DC

## 2013-11-14 NOTE — Progress Notes (Signed)
   Subjective:    Patient ID: [name], female    DOB: 03/29/1956, 58 y.o.   MRN: [id number]  HPI This 58 y.o. female presents for evaluation of URI, cough, and feeling washed out.  She has hx Of fatty liver disease she states and had some recent labs showing elevated alk phos and she is supposed to repeat this.  .   Review of Systems No chest pain, SOB, HA, dizziness, vision change, N/V, diarrhea, constipation, dysuria, urinary urgency or frequency, myalgias, arthralgias or rash.     Objective:   Physical Exam Vital signs noted  Well developed well nourished female.  HEENT - Head atraumatic Normocephalic                Eyes - PERRLA, Conjuctiva - OD clear OS injected Sclera- Clear EOMI                Ears - EAC's Wnl TM's Wnl Gross Hearing WNL                Throat - oropharanx wnl Respiratory - Lungs CTA bilateral Cardiac - RRR S1 and S2 without murmur GI - Abdomen soft Nontender and bowel sounds active x 4 Extremities - No edema. Neuro - Grossly intact.       Assessment & Plan:  Conjunctivitis - Plan: trimethoprim-polymyxin b (POLYTRIM) ophthalmic solution, Alkaline phosphatase  URI (upper respiratory infection) - Plan: azithromycin (ZITHROMAX) 250 MG tablet, triamcinolone acetonide (KENALOG-40) injection 60 mg, Alkaline phosphatase  Elevated alkaline phosphatase level - Plan: Alkaline phosphatase  Lysbeth Penner FNP

## 2013-11-15 ENCOUNTER — Telehealth: Payer: Self-pay | Admitting: Family Medicine

## 2013-11-15 LAB — ALKALINE PHOSPHATASE: Alkaline Phosphatase: 189 IU/L — ABNORMAL HIGH (ref 39–117)

## 2013-11-15 NOTE — Telephone Encounter (Signed)
Message copied by Waverly Ferrari on Tue Nov 15, 2013 10:12 AM ------      Message from: Lysbeth Penner      Created: Tue Nov 15, 2013  9:43 AM       Repeat alk phos elevated, Is she having arthritis sx's, she has no gallbladder so doubt is GI etiology.  Can get alk phos isoenzymes to differentiate between gut or bone and recommend if no sx's to follow up next visit ------

## 2013-11-18 NOTE — Telephone Encounter (Signed)
Patient aware.

## 2013-11-23 ENCOUNTER — Other Ambulatory Visit (INDEPENDENT_AMBULATORY_CARE_PROVIDER_SITE_OTHER): Payer: Managed Care, Other (non HMO)

## 2013-11-23 DIAGNOSIS — R748 Abnormal levels of other serum enzymes: Secondary | ICD-10-CM

## 2013-11-24 ENCOUNTER — Other Ambulatory Visit: Payer: Managed Care, Other (non HMO)

## 2013-11-28 LAB — ALKALINE PHOSPHATASE, ISOENZYMES
Alkaline Phosphatase: 158 IU/L — ABNORMAL HIGH (ref 39–117)
BONE FRACTION: 23 % (ref 14–68)
INTESTINAL FRAC.: 9 % (ref 0–18)
LIVER FRACTION: 68 % (ref 18–85)

## 2013-11-30 ENCOUNTER — Telehealth: Payer: Self-pay | Admitting: Family Medicine

## 2013-11-30 NOTE — Telephone Encounter (Signed)
Results have not been reviewed. The alk phos is still elevated but improving.  We will call with recommendations once they have been reviewed.  Patient stated understanding.

## 2013-12-01 ENCOUNTER — Telehealth: Payer: Self-pay | Admitting: *Deleted

## 2014-02-06 ENCOUNTER — Other Ambulatory Visit: Payer: Self-pay | Admitting: Obstetrics and Gynecology

## 2014-02-07 LAB — CYTOLOGY - PAP

## 2014-02-13 ENCOUNTER — Other Ambulatory Visit: Payer: Self-pay | Admitting: Obstetrics and Gynecology

## 2014-02-13 DIAGNOSIS — R928 Other abnormal and inconclusive findings on diagnostic imaging of breast: Secondary | ICD-10-CM

## 2014-02-17 ENCOUNTER — Other Ambulatory Visit: Payer: Self-pay | Admitting: Obstetrics and Gynecology

## 2014-02-17 ENCOUNTER — Ambulatory Visit
Admission: RE | Admit: 2014-02-17 | Discharge: 2014-02-17 | Disposition: A | Discharge status: Home / Self Care | Payer: Private Health Insurance - Indemnity | Source: Ambulatory Visit | Attending: Obstetrics and Gynecology | Admitting: Obstetrics and Gynecology

## 2014-02-17 DIAGNOSIS — R928 Other abnormal and inconclusive findings on diagnostic imaging of breast: Secondary | ICD-10-CM

## 2014-02-17 DIAGNOSIS — N6320 Unspecified lump in the left breast, unspecified quadrant: Secondary | ICD-10-CM

## 2014-02-22 ENCOUNTER — Ambulatory Visit
Admission: RE | Admit: 2014-02-22 | Discharge: 2014-02-22 | Disposition: A | Discharge status: Home / Self Care | Payer: Private Health Insurance - Indemnity | Source: Ambulatory Visit | Attending: Obstetrics and Gynecology | Admitting: Obstetrics and Gynecology

## 2014-02-22 ENCOUNTER — Other Ambulatory Visit: Payer: Self-pay | Admitting: Obstetrics and Gynecology

## 2014-02-22 ENCOUNTER — Other Ambulatory Visit: Payer: Self-pay | Admitting: Cardiothoracic Surgery

## 2014-02-22 DIAGNOSIS — N6320 Unspecified lump in the left breast, unspecified quadrant: Secondary | ICD-10-CM

## 2014-03-06 ENCOUNTER — Other Ambulatory Visit: Payer: Self-pay | Admitting: Nurse Practitioner

## 2014-03-07 NOTE — Telephone Encounter (Signed)
Last ov 5/15.

## 2014-04-19 ENCOUNTER — Other Ambulatory Visit: Payer: Self-pay | Admitting: *Deleted

## 2014-04-19 DIAGNOSIS — J9859 Other diseases of mediastinum, not elsewhere classified: Secondary | ICD-10-CM

## 2014-04-20 ENCOUNTER — Encounter: Payer: Self-pay | Admitting: Family Medicine

## 2014-04-20 ENCOUNTER — Ambulatory Visit (INDEPENDENT_AMBULATORY_CARE_PROVIDER_SITE_OTHER): Payer: 59 | Admitting: Family Medicine

## 2014-04-20 VITALS — BP 128/78 | HR 75 | Temp 97.7°F | Ht 64.0 in | Wt 192.0 lb

## 2014-04-20 DIAGNOSIS — F32A Depression, unspecified: Secondary | ICD-10-CM

## 2014-04-20 DIAGNOSIS — F329 Major depressive disorder, single episode, unspecified: Secondary | ICD-10-CM

## 2014-04-20 DIAGNOSIS — Z23 Encounter for immunization: Secondary | ICD-10-CM

## 2014-04-20 DIAGNOSIS — F419 Anxiety disorder, unspecified: Secondary | ICD-10-CM

## 2014-04-20 DIAGNOSIS — R748 Abnormal levels of other serum enzymes: Secondary | ICD-10-CM

## 2014-04-20 MED ORDER — ALPRAZOLAM 0.5 MG PO TABS: 0.5000 mg | ORAL_TABLET | Freq: Every evening | ORAL | Status: DC | PRN

## 2014-04-20 NOTE — Progress Notes (Signed)
   Subjective:    Patient ID: Kayla Romero, female    DOB: [birth year], 58 y.o.   MRN: [id number]  HPI 58 year old female who presents today to followup with abnormal lab. A year ago she had a normal alkaline phosphatase but over the last 6 months it has been noted to be elevated without clear or source, bone versus liver. She did have a cholecystectomy about one year ago and the alkaline phosphatase could possibly be related to obstructive disease. She feels well. She has no bone pain. We spent some time talking about her weight control.    Review of Systems  Constitutional: Negative.   HENT: Negative.   Eyes: Negative.   Respiratory: Negative.   Cardiovascular: Negative.   Gastrointestinal: Negative.   Endocrine: Negative.   Genitourinary: Negative.   Musculoskeletal: Positive for myalgias.       Bilateral thumb pain  Skin: Rash: not true rash but healing intertrigo.  Hematological: Negative.   Psychiatric/Behavioral: Negative.        Objective:   Physical Exam  Constitutional: She is oriented to person, place, and time. She appears well-developed and well-nourished.  Eyes: Conjunctivae and EOM are normal.  Neck: Normal range of motion. Neck supple.  Cardiovascular: Normal rate, regular rhythm and normal heart sounds.   Pulmonary/Chest: Effort normal and breath sounds normal.  Abdominal: Soft. Bowel sounds are normal.  Musculoskeletal: Normal range of motion.  Neurological: She is alert and oriented to person, place, and time. She has normal reflexes.  Skin: Skin is warm and dry.  Psychiatric: She has a normal mood and affect. Her behavior is normal. Thought content normal.    BP 128/78  Pulse 75  Temp(Src) 97.7 F (36.5 C) (Oral)  Ht 5\' 4"  (1.626 m)  Wt 192 lb (87.091 kg)  BMI 32.94 kg/m2  LMP 11/26/2010      Assessment & Plan:  1. Anxiety  - ALPRAZolam (XANAX) 0.5 MG tablet; Take 1 tablet (0.5 mg total) by mouth at bedtime as needed (panic attack).  Dispense:  30 tablet; Refill: 2  2. Depression Doing well on bupropion  3. Elevated alkaline phosphatase level Repeat lab; no etiology known - Alkaline phosphatase  4. Encounter for immunization   Wardell Honour MD

## 2014-04-20 NOTE — Patient Instructions (Signed)

## 2014-04-21 LAB — ALKALINE PHOSPHATASE: ALK PHOS: 127 IU/L — AB (ref 39–117)

## 2014-04-25 ENCOUNTER — Telehealth: Payer: Self-pay | Admitting: Family Medicine

## 2014-04-25 NOTE — Telephone Encounter (Signed)
Message copied by Waverly Ferrari on Tue Apr 25, 2014 10:48 AM ------      Message from: Wardell Honour      Created: Fri Apr 21, 2014  8:17 AM       Alkaline phosphatase has decreased but still a little bit high. An absence of symptoms would continue to follow. Repeat in 3 months ------

## 2014-05-22 ENCOUNTER — Encounter: Payer: Self-pay | Admitting: *Deleted

## 2014-05-24 ENCOUNTER — Encounter: Payer: Self-pay | Admitting: *Deleted

## 2014-05-24 ENCOUNTER — Ambulatory Visit: Payer: Private Health Insurance - Indemnity | Admitting: Cardiothoracic Surgery

## 2014-05-24 ENCOUNTER — Other Ambulatory Visit: Payer: Self-pay | Admitting: *Deleted

## 2014-05-24 ENCOUNTER — Other Ambulatory Visit: Payer: Private Health Insurance - Indemnity

## 2014-05-24 DIAGNOSIS — J9859 Other diseases of mediastinum, not elsewhere classified: Secondary | ICD-10-CM

## 2014-05-30 ENCOUNTER — Ambulatory Visit (HOSPITAL_COMMUNITY): Payer: Commercial Managed Care - PPO

## 2014-05-31 ENCOUNTER — Other Ambulatory Visit: Payer: Self-pay | Admitting: *Deleted

## 2014-05-31 ENCOUNTER — Ambulatory Visit: Payer: Private Health Insurance - Indemnity | Admitting: Cardiothoracic Surgery

## 2014-05-31 DIAGNOSIS — J9859 Other diseases of mediastinum, not elsewhere classified: Secondary | ICD-10-CM

## 2014-06-14 ENCOUNTER — Ambulatory Visit: Payer: Private Health Insurance - Indemnity | Admitting: Cardiothoracic Surgery

## 2014-06-16 ENCOUNTER — Ambulatory Visit (INDEPENDENT_AMBULATORY_CARE_PROVIDER_SITE_OTHER): Payer: Commercial Managed Care - PPO | Admitting: Cardiothoracic Surgery

## 2014-06-16 ENCOUNTER — Encounter: Payer: Self-pay | Admitting: Cardiothoracic Surgery

## 2014-06-16 ENCOUNTER — Ambulatory Visit
Admission: RE | Admit: 2014-06-16 | Discharge: 2014-06-16 | Disposition: A | Discharge status: Home / Self Care | Payer: Commercial Managed Care - PPO | Source: Ambulatory Visit | Attending: Cardiothoracic Surgery | Admitting: Cardiothoracic Surgery

## 2014-06-16 VITALS — BP 114/74 | HR 80 | Resp 20 | Ht 64.0 in | Wt 190.0 lb

## 2014-06-16 DIAGNOSIS — R222 Localized swelling, mass and lump, trunk: Secondary | ICD-10-CM

## 2014-06-16 DIAGNOSIS — J9859 Other diseases of mediastinum, not elsewhere classified: Secondary | ICD-10-CM

## 2014-06-16 NOTE — Progress Notes (Signed)
PCP is Redge Gainer, MD Referring Provider is Vernie Shanks, MD      [address]       [place] [address]             [phone]       Chief Complaint  Patient presents with  . 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 Arlyce Dice. It is not changed over the years. Last CT scan was April 2014. It measures 2.1 cm. It is asymptomatic. Patient continues to smoke 5-10 cigarettes daily. 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    Past Medical History  Diagnosis Date  . RUQ pain   . History of EKG     first presented /w L shoulder pain, 2007, seen by [facility]. , had EKG then & it was wnl.    . Fatty liver   . Cyst of thymus gland     being monitored  . Thymus, cyst     first seen on 2007, had some care with Dr. Arlyce Dice & then a Angelina Sheriff MD fr. [facility]    . Thymus, cyst     diagnosed approx. 2007, seen by Dr. Arlyce Dice, but now followed by [facility] in [place]  . Anxiety     h/o, recently has not had to use xanax much.  . Depression   . Obesity (BMI 30-39.9)   . Vitamin D deficiency     Past Surgical History  Procedure Laterality Date  . Tubal ligation  1988  . Eye surgery  2008    bilateral removal of cataracts, /w IOL  . Cholecystectomy N/A 08/31/2012    Procedure: LAPAROSCOPIC CHOLECYSTECTOMY possible IOC ;  Surgeon: Rolm Bookbinder, MD;  Location: [facility];  Service: General;  Laterality: N/A;    Family History  Problem Relation Age of Onset  . Heart disease Mother   . Cirrhosis Mother   . Kidney disease Mother   . Cancer Mother     ovarian  . Aneurysm Father     Social History History  Substance Use Topics  . Smoking status: Current Every Day Smoker    Types: Cigarettes    Last Attempt to Quit: 08/20/2010  . Smokeless tobacco:  Never Used     Comment: quit 2 weeks ago completely. had quit in 2012 but restarted  . Alcohol Use: No    Current Outpatient Prescriptions  Medication Sig Dispense Refill  . ALPRAZolam (XANAX) 0.5 MG tablet Take 1 tablet (0.5 mg total) by mouth at bedtime as needed (panic attack). 30 tablet 2  . buPROPion (WELLBUTRIN XL) 150 MG 24 hr tablet TAKE 1 TABLET BY EVERY MORNING 90 tablet 0  . Cholecalciferol (VITAMIN D) 2000 UNITS CAPS Take 2,000 Units by mouth daily.      No current facility-administered medications for this visit.    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 Little change on serial radiographic studies since 2009 Plan on repeating CT scan in April 2016 to get a 2 year followup to determine if the mass has changed  in size as this chest x-ray was not adequate to make a measurement  Plan: Return in 4 months with CT chest with contrast to the study mediastinal mass and to  Make  exact measurements in order to direct care

## 2014-06-18 ENCOUNTER — Other Ambulatory Visit: Payer: Self-pay | Admitting: Family Medicine

## 2014-07-25 ENCOUNTER — Other Ambulatory Visit: Payer: Self-pay | Admitting: Obstetrics and Gynecology

## 2014-07-25 DIAGNOSIS — N6320 Unspecified lump in the left breast, unspecified quadrant: Secondary | ICD-10-CM

## 2014-08-08 ENCOUNTER — Ambulatory Visit (INDEPENDENT_AMBULATORY_CARE_PROVIDER_SITE_OTHER): Payer: 59 | Admitting: Family Medicine

## 2014-08-08 ENCOUNTER — Ambulatory Visit (INDEPENDENT_AMBULATORY_CARE_PROVIDER_SITE_OTHER): Payer: 59

## 2014-08-08 ENCOUNTER — Encounter: Payer: Self-pay | Admitting: Family Medicine

## 2014-08-08 ENCOUNTER — Encounter (INDEPENDENT_AMBULATORY_CARE_PROVIDER_SITE_OTHER): Payer: Self-pay

## 2014-08-08 VITALS — BP 117/64 | HR 64 | Temp 97.8°F | Ht 64.0 in | Wt 194.0 lb

## 2014-08-08 DIAGNOSIS — F419 Anxiety disorder, unspecified: Secondary | ICD-10-CM

## 2014-08-08 DIAGNOSIS — M25561 Pain in right knee: Secondary | ICD-10-CM

## 2014-08-08 DIAGNOSIS — G8929 Other chronic pain: Secondary | ICD-10-CM | POA: Insufficient documentation

## 2014-08-08 DIAGNOSIS — M25569 Pain in unspecified knee: Secondary | ICD-10-CM

## 2014-08-08 MED ORDER — DICLOFENAC SODIUM 75 MG PO TBEC: 75.0000 mg | DELAYED_RELEASE_TABLET | Freq: Two times a day (BID) | ORAL | Status: DC

## 2014-08-08 NOTE — Progress Notes (Signed)
Subjective:  Patient ID: Kayla Romero, female    DOB: 01/22/1956  Age: 59 y.o. MRN: [id number]  CC: GAD and Knee Pain   HPI ZERA MARKWARDT presents for anxiety. Also has thymus cyst.Getting frequent CT scan Low vit D.Taking xanax in AM patient is in today for follow-up on her anxieties. She is taking the Xanax once daily prn at night when feeling anxious. May be as infrequent as every 2 weeks. and getting good relief along with the Wellbutrin. Anxiety seems to be under fairly good control. She does not mention any acute anxieties at this time. [place] and does have some frustrations with the with the insurance companies but other than that does not express any acute anxieties. Additionally she has right knee pain. This was onset suddenly 5 days ago when she kneels down when her right leg touch the ground and she put weight on it she felt an excruciating pain at the lower portion of the anterior knee and across the front aspect of the knee joint through the infrapatellar space over the next few days she was able to walk okay but the pain seemed to transfer to the posterior knee. It remains painful for her at a 5/10. It was 10/10 or higher at onset. History Sonya has a past medical history of RUQ pain; History of EKG; Fatty liver; Cyst of thymus gland; Thymus, cyst; Thymus, cyst; Anxiety; Depression; Obesity (BMI 30-39.9); and Vitamin D deficiency.   She has past surgical history that includes Tubal ligation (1988); Eye surgery (2008); and Cholecystectomy (N/A, 08/31/2012).   Her family history includes Aneurysm in her father; Cancer in her mother; Cirrhosis in her mother; Heart disease in her mother; Kidney disease in her mother.She reports that she has been smoking Cigarettes.  She has never used smokeless tobacco. She reports that she does not drink alcohol or use illicit drugs.  Current Outpatient Prescriptions on File Prior to Visit    Medication Sig Dispense Refill  . ALPRAZolam (XANAX) 0.5 MG tablet Take 1 tablet (0.5 mg total) by mouth at bedtime as needed (panic attack). 30 tablet 2  . buPROPion (WELLBUTRIN XL) 150 MG 24 hr tablet TAKE 1 TABLET BY EVERY MORNING 90 tablet 0  . Cholecalciferol (VITAMIN D) 2000 UNITS CAPS Take 2,000 Units by mouth daily.      No current facility-administered medications on file prior to visit.    ROS Review of Systems  Constitutional: Negative for fever, chills, diaphoresis, appetite change, fatigue and unexpected weight change.  HENT: Negative for congestion, ear pain, hearing loss, postnasal drip, rhinorrhea, sneezing, sore throat and trouble swallowing.   Eyes: Negative for pain.  Respiratory: Negative for cough, chest tightness and shortness of breath.   Cardiovascular: Negative for chest pain and palpitations.  Gastrointestinal: Negative for nausea, vomiting, abdominal pain, diarrhea and constipation.  Genitourinary: Negative for dysuria, frequency and menstrual problem.  Musculoskeletal: Negative for joint swelling and arthralgias.  Skin: Negative for rash.  Neurological: Negative for dizziness, weakness, numbness and headaches.  Psychiatric/Behavioral: Negative for dysphoric mood and agitation.    Objective:  BP 117/64 mmHg  Pulse 64  Temp(Src) 97.8 F (36.6 C) (Oral)  Ht 5\' 4"  (1.626 m)  Wt 194 lb (87.998 kg)  BMI 33.28 kg/m2  LMP 11/26/2010  BP Readings from Last 3 Encounters:  08/08/14 117/64  06/16/14 114/74  04/20/14 128/78    Wt Readings from Last 3 Encounters:  08/08/14  194 lb (87.998 kg)  06/16/14 190 lb (86.183 kg)  04/20/14 192 lb (87.091 kg)     Physical Exam  Constitutional: She is oriented to person, place, and time. She appears well-developed and well-nourished. No distress.  HENT:  Head: Normocephalic and atraumatic.  Right Ear: External ear normal.  Left Ear: External ear normal.  Nose: Nose normal.  Mouth/Throat: Oropharynx is clear  and moist.  Eyes: Conjunctivae and EOM are normal. Pupils are equal, round, and reactive to light.  Neck: Normal range of motion. Neck supple. No thyromegaly present.  Cardiovascular: Normal rate, regular rhythm and normal heart sounds.   No murmur heard. Pulmonary/Chest: Effort normal and breath sounds normal. No respiratory distress. She has no wheezes. She has no rales.  Abdominal: Soft. Bowel sounds are normal. She exhibits no distension. There is no tenderness.  Lymphadenopathy:    She has no cervical adenopathy.  Neurological: She is alert and oriented to person, place, and time. She has normal reflexes.  Skin: Skin is warm and dry.  Psychiatric: She has a normal mood and affect. Her behavior is normal. Judgment and thought content normal.    Lab Results  Component Value Date   HGBA1C 5.2% 10/18/2013    Lab Results  Component Value Date   WBC 8.4 09/02/2012   HGB 12.1 09/02/2012   HCT 34.3* 09/02/2012   PLT 244 09/02/2012   GLUCOSE 88 10/18/2013   CHOL 141 07/18/2013   TRIG 120 07/18/2013   HDL 41 07/18/2013   LDLCALC 76 07/18/2013   ALT 32 10/18/2013   AST 22 10/18/2013   NA 142 10/18/2013   K 4.3 10/18/2013   CL 106 10/18/2013   CREATININE 0.79 10/18/2013   BUN 17 10/18/2013   CO2 24 10/18/2013   HGBA1C 5.2% 10/18/2013    Dg Chest 2 View  06/16/2014   CLINICAL DATA:  Mediastinal mass.  EXAM: CHEST  2 VIEW  COMPARISON:  CT of the chest without contrast 10/25/2012.  FINDINGS: The heart size is normal. The lungs are clear. The known mediastinal mass is somewhat obscured by adjacent structures. Given its location, CT would be useful to evaluate any discrete changes. The lungs are clear. Degenerative changes of the thoracic spine are stable.  IMPRESSION: 1. No definite change in the anterior mediastinal mass. The mass is not discretely identified due to adjacent structures and overlapping shadows. CT would be useful for more discrete measurement if clinically indicated.  2. Otherwise normal two-view chest.   Electronically Signed   By: Lawrence Santiago M.D.   On: 06/16/2014 13:03    Assessment & Plan:   Kourtnei was seen today for gad and knee pain.  Diagnoses and associated orders for this visit:  Knee pain, chronic, right - DG Knee 1-2 Views Right; Future - DME Other see comment  Anxiety  Other Orders - diclofenac (VOLTAREN) 75 MG EC tablet; Take 1 tablet (75 mg total) by mouth 2 (two) times daily.    I am having Ms. Bowditch start on diclofenac. I am also having her maintain her Vitamin D, ALPRAZolam, and buPROPion.  Meds ordered this encounter  Medications  . diclofenac (VOLTAREN) 75 MG EC tablet    Sig: Take 1 tablet (75 mg total) by mouth 2 (two) times daily.    Dispense:  60 tablet    Refill:  2    Follow-up: Return in about 3 months (around 11/06/2014), or if symptoms worsen or fail to improve.  Claretta Fraise, M.D.

## 2014-08-23 ENCOUNTER — Ambulatory Visit
Admission: RE | Admit: 2014-08-23 | Discharge: 2014-08-23 | Disposition: A | Discharge status: Home / Self Care | Payer: 59 | Source: Ambulatory Visit | Attending: Obstetrics and Gynecology | Admitting: Obstetrics and Gynecology

## 2014-08-23 DIAGNOSIS — N6320 Unspecified lump in the left breast, unspecified quadrant: Secondary | ICD-10-CM

## 2014-09-13 ENCOUNTER — Other Ambulatory Visit: Payer: Self-pay | Admitting: *Deleted

## 2014-09-13 DIAGNOSIS — Q341 Congenital cyst of mediastinum: Secondary | ICD-10-CM

## 2014-09-16 ENCOUNTER — Other Ambulatory Visit: Payer: Self-pay | Admitting: *Deleted

## 2014-09-16 MED ORDER — BUPROPION HCL ER (XL) 150 MG PO TB24: ORAL_TABLET | ORAL | Status: DC

## 2014-10-05 ENCOUNTER — Telehealth: Payer: Self-pay | Admitting: Family Medicine

## 2014-10-11 ENCOUNTER — Encounter: Payer: Self-pay | Admitting: *Deleted

## 2014-10-18 ENCOUNTER — Ambulatory Visit
Admission: RE | Admit: 2014-10-18 | Discharge: 2014-10-18 | Disposition: A | Discharge status: Home / Self Care | Payer: 59 | Source: Ambulatory Visit | Attending: Cardiothoracic Surgery | Admitting: Cardiothoracic Surgery

## 2014-10-18 ENCOUNTER — Encounter: Payer: 59 | Admitting: Cardiothoracic Surgery

## 2014-10-18 DIAGNOSIS — Q341 Congenital cyst of mediastinum: Secondary | ICD-10-CM

## 2014-10-18 MED ORDER — IOPAMIDOL (ISOVUE-300) INJECTION 61%
75.0000 mL | Freq: Once | INTRAVENOUS | Status: AC | PRN
  Administered 2014-10-18: 75 mL via INTRAVENOUS

## 2014-10-19 ENCOUNTER — Encounter: Payer: Self-pay | Admitting: Cardiothoracic Surgery

## 2014-10-19 ENCOUNTER — Ambulatory Visit (INDEPENDENT_AMBULATORY_CARE_PROVIDER_SITE_OTHER): Payer: 59 | Admitting: Cardiothoracic Surgery

## 2014-10-19 VITALS — BP 112/73 | HR 94 | Resp 20 | Ht 64.0 in | Wt 195.0 lb

## 2014-10-19 DIAGNOSIS — R222 Localized swelling, mass and lump, trunk: Secondary | ICD-10-CM | POA: Diagnosis not present

## 2014-10-19 DIAGNOSIS — J9859 Other diseases of mediastinum, not elsewhere classified: Secondary | ICD-10-CM

## 2014-10-19 NOTE — Progress Notes (Signed)
PCP is Claretta Fraise, MD Referring Provider is Vernie Shanks, MD  Chief Complaint  Patient presents with  . Follow-up    4 month f/u with Chest CT, thymic cyst    HPI:the patient returns for followup with annual CT scan for a 2.2 cm thymic cyst first noted in 2007 and followed with serial CT scans since then. It is asymptomatic. Is not increased in size. I personally reviewed the CT scan. Anterior mediastinal cystic mass measures 2.3 cm, no change After following this with scans for 9 years it is safe to confirm this is a benign cyst and does not need further serial CT scan in due to the side effects of repeated radiation.  The patient still smokes occasionally. Careful review of her lung fields on a CT scan shows no evidence of poor nodules, there is no significant mediastinal adenopathy.   Past Medical History  Diagnosis Date  . RUQ pain   . History of EKG     first presented /w L shoulder pain, 2007, seen by [facility]. , had EKG then & it was wnl.    . Fatty liver   . Cyst of thymus gland     being monitored  . Thymus, cyst     first seen on 2007, had some care with Dr. Arlyce Dice & then a Angelina Sheriff MD fr. [facility]    . Thymus, cyst     diagnosed approx. 2007, seen by Dr. Arlyce Dice, but now followed by [facility] in [place]  . Anxiety     h/o, recently has not had to use xanax much.  . Depression   . Obesity (BMI 30-39.9)   . Vitamin D deficiency   . Mediastinal mass     Past Surgical History  Procedure Laterality Date  . Tubal ligation  1988  . Eye surgery  2008    bilateral removal of cataracts, /w IOL  . Cholecystectomy N/A 08/31/2012    Procedure: LAPAROSCOPIC CHOLECYSTECTOMY possible IOC ;  Surgeon: Rolm Bookbinder, MD;  Location: [facility] OR;  Service: General;  Laterality: N/A;    Family History  Problem Relation Age of Onset  . Heart disease Mother   . Cirrhosis Mother   . Kidney disease Mother   . Cancer Mother     ovarian  . Aneurysm Father      Social History History  Substance Use Topics  . Smoking status: Current Every Day Smoker    Types: Cigarettes    Last Attempt to Quit: 08/20/2010  . Smokeless tobacco: Never Used     Comment: quit 2 weeks ago completely. had quit in 2012 but restarted  . Alcohol Use: No    Current Outpatient Prescriptions  Medication Sig Dispense Refill  . ALPRAZolam (XANAX) 0.5 MG tablet Take 1 tablet (0.5 mg total) by mouth at bedtime as needed (panic attack). 30 tablet 2  . buPROPion (WELLBUTRIN XL) 150 MG 24 hr tablet TAKE 1 TABLET BY EVERY MORNING 90 tablet 0  . Cholecalciferol (VITAMIN D) 2000 UNITS CAPS Take 2,000 Units by mouth daily.      No current facility-administered medications for this visit.    No Known Allergies  Review of Systems  Gen.-no fever weight loss or night sweats HEENT-no change in vision, some hiccups  with eating now Lungs-no cough or wheezing Cardiac no palpitations or angina Abdomen no nausea vomiting jaundice Extremities no arthritis or gout Neuro-no dizziness or syncope  BP 112/73 mmHg  Pulse 94  Resp 20  Ht 5\' 4"  (1.626 m)  Wt 195 lb (88.451 kg)  BMI 33.46 kg/m2  SpO2 97%  LMP 11/26/2010 Physical Exam Gen.-alert and comfortable no acute distress HEENT-normocephalic dentition good Lymphatics-no palpable nodes in the neck Lungs-clear to auscultation Cardiac-regular rhythm with good are full pulses Abdomen soft nontender Extremities without edema or deformity Neurologic no focal motor deficit  Diagnostic Tests: CT scan personally reviewed and compared to previous scan showing a small 2.3 cm cystic structure in intermediastinum without change for the past 7 or greater years.  Impression: Stable thymic cyst, no risk for malignant transformation. No need to  continue serial CT scans.  Plan:return as needed   Len Childs, MD [facility] [phone]

## 2014-10-24 IMAGING — NM NM HEPATO W/GB/PHARM/[PERSON_NAME]
2 series · 12 of 12 positions shown · non-contrast
Comparison: None

CLINICAL DATA: Right upper quadrant pain

NUCLEAR MEDICINE HEPATOBILIARY IMAGING WITH GALLBLADDER EF:
TECHNIQUE: Sequential images of the abdomen were obtained for 60
minutes following intravenous administration of
radiopharmaceutical.  Patient then ingested 8 ounces of
commercially available Ensure Plus and imaging was continued for 60
minutes.  A time-activity curve was generated from tracer within
the gallbladder following Ensure Plus ingestion, and the
gallbladder ejection fraction was calculated.
Radiopharmaceutical:  5.0 mCi 9c-IIm mebrofenin

[hida · 3.20mm/px · 6 of 60 frames shown (1 of 2)]
[frame 6/60]
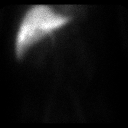
[frame 16/60]
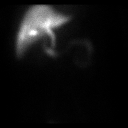
[frame 26/60]
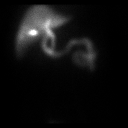
[frame 36/60]
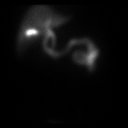
[frame 46/60]
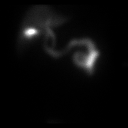
[frame 56/60]
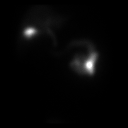

[hida · 3.20mm/px · 6 of 60 frames shown (2 of 2)]
[frame 6/60]
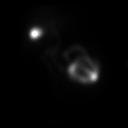
[frame 16/60]
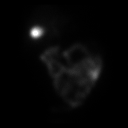
[frame 26/60]
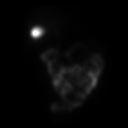
[frame 36/60]
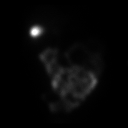
[frame 46/60]
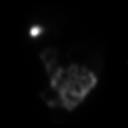
[frame 56/60]
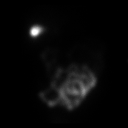

[12 of 12 positions shown; findings below may reference images not displayed]

FINDINGS: Prompt tracer extraction from bloodstream, indicating normal
hepatocellular function.
Prompt excretion of tracer into biliary tree.
Gallbladder visualized at 12 minutes.
Small bowel visualized at 13 minutes.
No hepatic retention of tracer.

Subjectively decreased emptying of tracer from gallbladder
following fatty meal stimulation.
Calculated gallbladder ejection fraction is 21%, abnormally low.
Patient experienced no symptoms following fatty meal ingestion.
IMPRESSION: Patent biliary tree.
Decreased gallbladder response to fatty meal stimulation with a
decreased gallbladder ejection fraction of 21%.

Normal values for gallbladder ejection fraction:
> 30% for exams utilizing sincalide (CCK)
> 33% for exams utilizing fatty meal stimulation with Ensure Plus

## 2014-10-27 ENCOUNTER — Ambulatory Visit (INDEPENDENT_AMBULATORY_CARE_PROVIDER_SITE_OTHER): Payer: 59 | Admitting: Family

## 2014-10-27 ENCOUNTER — Encounter: Payer: Self-pay | Admitting: Family

## 2014-10-27 ENCOUNTER — Ambulatory Visit (INDEPENDENT_AMBULATORY_CARE_PROVIDER_SITE_OTHER): Payer: 59

## 2014-10-27 VITALS — BP 134/73 | HR 68 | Temp 97.5°F | Ht 64.0 in | Wt 183.2 lb

## 2014-10-27 DIAGNOSIS — M25559 Pain in unspecified hip: Secondary | ICD-10-CM

## 2014-10-27 DIAGNOSIS — M79672 Pain in left foot: Secondary | ICD-10-CM

## 2014-10-27 DIAGNOSIS — M7732 Calcaneal spur, left foot: Secondary | ICD-10-CM | POA: Diagnosis not present

## 2014-10-27 MED ORDER — KETOROLAC TROMETHAMINE 60 MG/2ML IM SOLN
60.0000 mg | Freq: Once | INTRAMUSCULAR | Status: AC
  Administered 2014-10-27: 60 mg via INTRAMUSCULAR

## 2014-10-27 MED ORDER — TRAMADOL HCL 50 MG PO TABS: 50.0000 mg | ORAL_TABLET | Freq: Four times a day (QID) | ORAL | Status: DC | PRN

## 2014-10-27 MED ORDER — BUPIVACAINE HCL 0.25 % IJ SOLN: 5.0000 mL | Freq: Once | INTRAMUSCULAR | Status: DC

## 2014-10-27 MED ORDER — METHYLPREDNISOLONE ACETATE 80 MG/ML IJ SUSP: 80.0000 mg | Freq: Once | INTRAMUSCULAR | Status: DC

## 2014-10-27 MED ORDER — METHYLPREDNISOLONE ACETATE 80 MG/ML IJ SUSP
80.0000 mg | Freq: Once | INTRAMUSCULAR | Status: AC
  Administered 2014-10-27: 80 mg via INTRAMUSCULAR

## 2014-10-27 NOTE — Progress Notes (Signed)
Subjective:    Patient ID: Kayla Romero, female    DOB: 1955-10-17, 59 y.o.   MRN: [id number]  Foot Pain This is a new problem. The current episode started more than 1 month ago. The problem occurs constantly. The problem has been unchanged. Associated symptoms include numbness. Pertinent negatives include no chills, coughing, headaches, neck pain or rash. The symptoms are aggravated by standing. She has tried ice, NSAIDs and rest for the symptoms. The treatment provided mild relief.  Hip Pain  The incident occurred more than 1 week ago. The pain is present in the left hip and right hip. The quality of the pain is described as burning. The pain is at a severity of 5/10. The pain is moderate. The pain has been intermittent since onset. Associated symptoms include numbness and tingling. Pertinent negatives include no loss of motion or muscle weakness. She has tried NSAIDs, rest and ice for the symptoms. The treatment provided moderate relief.  Shoulder Pain  Associated symptoms include numbness and tingling.      Review of Systems  Constitutional: Negative.  Negative for chills.  HENT: Negative.   Eyes: Negative.   Respiratory: Negative.  Negative for cough and shortness of breath.   Cardiovascular: Negative.  Negative for palpitations.  Gastrointestinal: Negative.   Endocrine: Negative.   Genitourinary: Negative.   Musculoskeletal: Negative.  Negative for neck pain.  Skin: Negative for rash.  Neurological: Positive for tingling and numbness. Negative for headaches.  Hematological: Negative.   Psychiatric/Behavioral: Negative.   All other systems reviewed and are negative.      Objective:   Physical Exam  Constitutional: She is oriented to person, place, and time. She appears well-developed and well-nourished. No distress.  HENT:  Head: Normocephalic and atraumatic.  Right Ear: External ear normal.  Left Ear: External ear normal.  Nose: Nose normal.  Mouth/Throat:  Oropharynx is clear and moist.  Eyes: Pupils are equal, round, and reactive to light.  Neck: Normal range of motion. Neck supple. No thyromegaly present.  Cardiovascular: Normal rate, regular rhythm, normal heart sounds and intact distal pulses.   No murmur heard. Pulmonary/Chest: Effort normal and breath sounds normal. No respiratory distress. She has no wheezes.  Abdominal: Soft. Bowel sounds are normal. She exhibits no distension. There is no tenderness.  Musculoskeletal: Normal range of motion. She exhibits tenderness (on Left heel with palpation ). She exhibits no edema.  Neurological: She is alert and oriented to person, place, and time. She has normal reflexes. No cranial nerve deficit.  Skin: Skin is warm and dry.  Psychiatric: She has a normal mood and affect. Her behavior is normal. Judgment and thought content normal.  Vitals reviewed.  Foot x-ray- Left heel spur noted Preliminary reading by Evelina Dun, FNP [facility]  Pt refuses heel injection today. Pt would just like IM injections. Pt states "I can't handle the thought of a needle in my heel".   BP 134/73 mmHg  Pulse 68  Temp(Src) 97.5 F (36.4 C) (Oral)  Ht 5\' 4"  (1.626 m)  Wt 183 lb 3.2 oz (83.099 kg)  BMI 31.43 kg/m2  LMP 11/26/2010      Assessment & Plan:  1. Heel pain, left - DG Foot Complete Left; Future - ketorolac (TORADOL) injection 60 mg; Inject 2 mLs (60 mg total) into the muscle once. - methylPREDNISolone acetate (DEPO-MEDROL) injection 80 mg; Inject 1 mL (80 mg total) into the muscle once.  2. Heel spur, left -Rest -NSAID's -Fill water bottle and  roll every night - Foot exercises discussed -Need for good shoes with support discussed -RTO prn - ketorolac (TORADOL) injection 60 mg; Inject 2 mLs (60 mg total) into the muscle once. - methylPREDNISolone acetate (DEPO-MEDROL) injection 80 mg; Inject 1 mL (80 mg total) into the muscle once. - traMADol (ULTRAM) 50 MG tablet; Take 1 tablet (50 mg total) by  mouth every 6 (six) hours as needed.  Dispense: 60 tablet; Refill: 1  3. Hip pain, unspecified laterality - ketorolac (TORADOL) injection 60 mg; Inject 2 mLs (60 mg total) into the muscle once. - methylPREDNISolone acetate (DEPO-MEDROL) injection 80 mg; Inject 1 mL (80 mg total) into the muscle once. - traMADol (ULTRAM) 50 MG tablet; Take 1 tablet (50 mg total) by mouth every 6 (six) hours as needed.  Dispense: 60 tablet; Refill: [place], FNP

## 2014-10-27 NOTE — Patient Instructions (Signed)
Heel Spur A heel spur is a hook of bone that can form on the calcaneus (the heel bone and the largest bone of the foot). Heel spurs are often associated with plantar fasciitis and usually come in people who have had the problem for an extended period of time. The cause of the relationship is unknown. The pain associated with them is thought to be caused by an inflammation (soreness and redness) of the plantar fascia rather than the spur itself. The plantar fascia is a thick fibrous like tissue that runs from the calcaneus (heel bone) to the ball of the foot. This strong, tight tissue helps maintain the arch of your foot. It helps distribute the weight across your foot as you walk or run. Stresses placed on the plantar fascia can be tremendous. When it is inflamed normal activities become painful. Pain is worse in the morning after sleeping. After sleeping the plantar fascia is tight. The first movements stretch the fascia and this causes pain. As the tendon loosens, the pain usually gets better. It often returns with too much standing or walking.  About 70% of patients with plantar fasciitis have a heel spur. About half of people without foot pain also have heel spurs. DIAGNOSIS  The diagnosis of a heel spur is made by X-ray. The X-ray shows a hook of bone protruding from the bottom of the calcaneus at the point where the plantar fascia is attached to the heel bone.  TREATMENT  It is necessary to find out what is causing the stretching of the plantar fascia. If the cause is over-pronation (flat feet), orthotics and proper foot ware may help.  Stretching exercises, losing weight, wearing shoes that have a cushioned heel that absorbs shock, and elevating the heel with the use of a heel cradle, heel cup, or orthotics may all help. Heel cradles and heel cups provide extra comfort and cushion to the heel, and reduce the amount of shock to the sore area. AVOIDING THE PAIN OF PLANTAR FASCIITIS AND HEEL  SPURS  Consult a sports medicine professional before beginning a new exercise program.  Walking programs offer a good workout. There is a lower chance of overuse injuries common to the runners. There is less impact and less jarring of the joints.  Begin all new exercise programs slowly. If problems or pains develop, decrease the amount of time or distance until you are at a comfortable level.  Wear good shoes and replace them regularly.  Stretch your foot and the heel cords at the back of the ankle (Achilles tendons) both before and after exercise.  Run or exercise on even surfaces that are not hard. For example, asphalt is better than pavement.  Do not run barefoot on hard surfaces.  If using a treadmill, vary the incline.  Do not continue to workout if you have foot or joint problems. Seek professional help if they do not improve. HOME CARE INSTRUCTIONS   Avoid activities that cause you pain until you recover.  Use ice or cold packs to the problem or painful areas after working out.  Only take over-the-counter or prescription medicines for pain, discomfort, or fever as directed by your caregiver.  Soft shoe inserts or athletic shoes with air or gel sole cushions may be helpful.  If problems continue or become more severe, consult a sports medicine caregiver. Cortisone is a potent anti-inflammatory medication that may be injected into the painful area. You can discuss this treatment with your caregiver. MAKE SURE YOU:  Understand these instructions.  Will watch your condition.  Will get help right away if you are not doing well or get worse. Document Released: 07/23/2005 Document Revised: 09/08/2011 Document Reviewed: 08/17/2013 [facility] Patient Information 2015 [place], [place]. This information is not intended to replace advice given to you by your health care provider. Make sure you discuss any questions you have with your health care provider.

## 2014-10-30 ENCOUNTER — Telehealth: Payer: Self-pay | Admitting: Family Medicine

## 2014-10-30 NOTE — Telephone Encounter (Signed)
Tender, swollen area near left jaw. Noticed it a few hours ago.  Pain is much worse when she opens her mouth to chew.  Denies popping or decreased range of motion in jaw.  Suggested 3 ibuprofen every 3 hours and to apply ice or heat for 20 minutes at a time several times this evening.  Call back or seek care tonight if symptoms worsen. Patient stated understanding and agreement to plan.  Appt scheduled for tomorrow morning with Dr Sabra Heck.

## 2014-10-31 ENCOUNTER — Ambulatory Visit (INDEPENDENT_AMBULATORY_CARE_PROVIDER_SITE_OTHER): Payer: 59 | Admitting: Family Medicine

## 2014-10-31 ENCOUNTER — Encounter: Payer: Self-pay | Admitting: Family Medicine

## 2014-10-31 VITALS — BP 137/82 | HR 55 | Temp 97.2°F | Ht 64.0 in | Wt 183.0 lb

## 2014-10-31 DIAGNOSIS — R599 Enlarged lymph nodes, unspecified: Secondary | ICD-10-CM | POA: Diagnosis not present

## 2014-10-31 DIAGNOSIS — R590 Localized enlarged lymph nodes: Secondary | ICD-10-CM

## 2014-10-31 NOTE — Progress Notes (Signed)
   Subjective:    Patient ID: Kayla Romero, female    DOB: 1955/11/15, 59 y.o.   MRN: [id number]  HPI 59 year old female with some pain and swelling at the left angle of her jaw. Yesterday she was instructed to take some ibuprofen and apply heat or ice in the problem has improved since yesterday in terms of swelling. She has had no fever. She denies any sore throat or ear symptoms. She does have some pain with chewing with these symptoms but also chronically.  Patient Active Problem List   Diagnosis Date Noted  . Knee pain, chronic 08/08/2014  . Anxiety   . Depression   . Fatty liver   . Obesity (BMI 30-39.9)   . Vitamin D deficiency   . Swelling, mass, or lump in chest 09/03/2011   Outpatient Encounter Prescriptions as of 10/31/2014  Medication Sig  . ALPRAZolam (XANAX) 0.5 MG tablet Take 1 tablet (0.5 mg total) by mouth at bedtime as needed (panic attack).  Marland Kitchen buPROPion (WELLBUTRIN XL) 150 MG 24 hr tablet TAKE 1 TABLET BY EVERY MORNING  . Cholecalciferol (VITAMIN D) 2000 UNITS CAPS Take 2,000 Units by mouth daily.   . traMADol (ULTRAM) 50 MG tablet Take 1 tablet (50 mg total) by mouth every 6 (six) hours as needed.   No facility-administered encounter medications on file as of 10/31/2014.      Review of Systems  Constitutional: Negative.   HENT: Negative.   Respiratory: Negative.   Cardiovascular: Negative.   Genitourinary: Negative.   Neurological: Negative.        Objective:   Physical Exam  Constitutional: She appears well-developed and well-nourished.  HENT:  There is some shotty left anterior cervical nodes. There are 3 tiny bumps on the skin overlying suggestive of possible bites. Left tympanic membrane appears normal. The throat is normal. There is no crepitance with opening and closing her mouth          Assessment & Plan:  1. Lymphadenopathy, anterior cervical Etiology uncertain. May be related to "bites." Suggested she continue with ibuprofen and warm  compresses and let us know if there is any change.  Wardell Honour MD

## 2014-12-24 ENCOUNTER — Other Ambulatory Visit: Payer: Self-pay | Admitting: Family Medicine

## 2015-03-13 ENCOUNTER — Other Ambulatory Visit: Payer: Self-pay | Admitting: Obstetrics and Gynecology

## 2015-03-14 LAB — CYTOLOGY - PAP

## 2015-03-26 ENCOUNTER — Other Ambulatory Visit: Payer: Self-pay | Admitting: Family Medicine

## 2015-03-27 NOTE — Telephone Encounter (Signed)
Last seen 10/31/14 Dr Sabra Heck

## 2015-03-29 ENCOUNTER — Other Ambulatory Visit: Payer: Self-pay | Admitting: Family Medicine

## 2015-03-29 ENCOUNTER — Other Ambulatory Visit: Payer: Self-pay | Admitting: Family

## 2015-03-30 MED ORDER — BUPROPION HCL ER (XL) 150 MG PO TB24: ORAL_TABLET | ORAL | Status: DC

## 2015-03-30 NOTE — Telephone Encounter (Signed)
Aware , script was sent in. 

## 2015-04-12 ENCOUNTER — Ambulatory Visit (INDEPENDENT_AMBULATORY_CARE_PROVIDER_SITE_OTHER): Payer: 59

## 2015-04-12 DIAGNOSIS — Z23 Encounter for immunization: Secondary | ICD-10-CM

## 2015-04-23 ENCOUNTER — Other Ambulatory Visit: Payer: Self-pay | Admitting: Family Medicine

## 2015-05-25 ENCOUNTER — Other Ambulatory Visit: Payer: Self-pay | Admitting: Family Medicine

## 2015-06-22 ENCOUNTER — Other Ambulatory Visit: Payer: Self-pay | Admitting: *Deleted

## 2015-06-22 ENCOUNTER — Other Ambulatory Visit: Payer: Self-pay | Admitting: Family Medicine

## 2015-06-22 MED ORDER — BUPROPION HCL ER (XL) 150 MG PO TB24: ORAL_TABLET | ORAL | Status: DC

## 2015-06-22 NOTE — Telephone Encounter (Signed)
Last seen 10/31/14 Dr Miller 

## 2015-10-05 ENCOUNTER — Other Ambulatory Visit: Payer: Self-pay | Admitting: Family

## 2015-10-05 DIAGNOSIS — M7732 Calcaneal spur, left foot: Secondary | ICD-10-CM

## 2015-10-05 DIAGNOSIS — M25559 Pain in unspecified hip: Secondary | ICD-10-CM

## 2015-10-05 MED ORDER — TRAMADOL HCL 50 MG PO TABS: 50.0000 mg | ORAL_TABLET | Freq: Four times a day (QID) | ORAL | Status: DC | PRN

## 2015-10-05 NOTE — Telephone Encounter (Signed)
Patient aware.

## 2015-10-05 NOTE — Telephone Encounter (Signed)
RX ready for pick up 

## 2015-12-22 ENCOUNTER — Other Ambulatory Visit: Payer: Self-pay | Admitting: Family

## 2015-12-24 ENCOUNTER — Other Ambulatory Visit: Payer: Self-pay | Admitting: Family

## 2015-12-26 ENCOUNTER — Other Ambulatory Visit: Payer: Self-pay | Admitting: Family

## 2016-01-16 ENCOUNTER — Encounter: Payer: Self-pay | Admitting: Family Medicine

## 2016-01-16 ENCOUNTER — Ambulatory Visit (INDEPENDENT_AMBULATORY_CARE_PROVIDER_SITE_OTHER): Payer: BLUE CROSS/BLUE SHIELD | Admitting: Family Medicine

## 2016-01-16 VITALS — BP 129/66 | HR 60 | Temp 97.3°F | Ht 64.0 in | Wt 187.2 lb

## 2016-01-16 DIAGNOSIS — F419 Anxiety disorder, unspecified: Secondary | ICD-10-CM

## 2016-01-16 DIAGNOSIS — E669 Obesity, unspecified: Secondary | ICD-10-CM

## 2016-01-16 MED ORDER — ALPRAZOLAM 0.5 MG PO TABS: 0.5000 mg | ORAL_TABLET | Freq: Every evening | ORAL | Status: DC | PRN

## 2016-01-16 MED ORDER — BUPROPION HCL ER (XL) 150 MG PO TB24: 150.0000 mg | ORAL_TABLET | Freq: Every morning | ORAL | Status: DC

## 2016-01-16 NOTE — Progress Notes (Signed)
   HPI  Patient presents today here to follow-up for anxiety.  Patient's been on Wellbutrin for a few years started by psychiatrist. She states that she tried Lexapro first and had "funny feelings". Her psychiatrist started Wellbutrin because she tolerated previously, she feels it helps quite a bit. She denies suicidal thoughts or feelings of depression.  Xanax is used very rarely, she states that she uses it occasionally, sometimes not even once a month, for panic attacks.  She understands that medication is abusable and will have to be here for refills.  He has quit smoking in January of this year. She does not drink alcohol  PMH: Smoking status noted ROS: Per HPI  Objective: BP 129/66 mmHg  Pulse 60  Temp(Src) 97.3 F (36.3 C) (Oral)  Ht 5\' 4"  (1.626 m)  Wt 187 lb 3.2 oz (84.913 kg)  BMI 32.12 kg/m2  LMP 11/26/2010 Gen: NAD, alert, cooperative with exam HEENT: NCAT CV: RRR, good S1/S2, no murmur Resp: CTABL, no wheezes, non-labored Ext: No edema, warm Neuro: Alert and oriented, No gross deficits  Psych: Denies suicidal ideation, appropriate mood and affect  Assessment and plan:  # Anxiety Refilled Wellbutrin, discussed the possibility that Wellbutrin could actually increase her anxiety however she seems to be getting benefit from it. Refilled Xanax, she is using very rarely  Return to clinic as needed, I recommended a 3 to four-month follow-up for labs and routine chronic condition follow-up with fatty liver and low vitamin D  Meds ordered this encounter  Medications  . ALPRAZolam (XANAX) 0.5 MG tablet    Sig: Take 1 tablet (0.5 mg total) by mouth at bedtime as needed (panic attack).    Dispense:  30 tablet    Refill:  0  . buPROPion (WELLBUTRIN XL) 150 MG 24 hr tablet    Sig: Take 1 tablet (150 mg total) by mouth every morning.    Dispense:  90 tablet    Refill:  [place]ard, MD [facility] 01/16/2016, 9:15 AM

## 2016-01-16 NOTE — Patient Instructions (Addendum)
Great to meet you!  Lets have you come back in the next few months for fasting labs and to follow up for your liver enzymes.

## 2016-01-21 ENCOUNTER — Other Ambulatory Visit: Payer: BLUE CROSS/BLUE SHIELD

## 2016-01-21 DIAGNOSIS — E669 Obesity, unspecified: Secondary | ICD-10-CM | POA: Diagnosis not present

## 2016-01-21 DIAGNOSIS — F419 Anxiety disorder, unspecified: Secondary | ICD-10-CM | POA: Diagnosis not present

## 2016-01-21 NOTE — Progress Notes (Signed)
Patient presenting today, 7/24 2017 for fasting labs.  Lab orders placed.  Laroy Apple, MD [facility] 01/21/2016, 9:05 AM

## 2016-01-21 NOTE — Addendum Note (Signed)
Addended by: Timmothy Euler on: 01/21/2016 09:05 AM   Modules accepted: Orders

## 2016-01-22 LAB — CBC WITH DIFFERENTIAL/PLATELET
BASOS: 1 %
Basophils Absolute: 0 10*3/uL (ref 0.0–0.2)
EOS (ABSOLUTE): 0.1 10*3/uL (ref 0.0–0.4)
Eos: 2 %
HEMATOCRIT: 40.8 % (ref 34.0–46.6)
Hemoglobin: 13.1 g/dL (ref 11.1–15.9)
Immature Grans (Abs): 0 10*3/uL (ref 0.0–0.1)
Immature Granulocytes: 0 %
Lymphocytes Absolute: 1.9 10*3/uL (ref 0.7–3.1)
Lymphs: 37 %
MCH: 29.6 pg (ref 26.6–33.0)
MCHC: 32.1 g/dL (ref 31.5–35.7)
MCV: 92 fL (ref 79–97)
MONOS ABS: 0.3 10*3/uL (ref 0.1–0.9)
Monocytes: 7 %
NEUTROS ABS: 2.7 10*3/uL (ref 1.4–7.0)
Neutrophils: 53 %
Platelets: 292 10*3/uL (ref 150–379)
RBC: 4.43 x10E6/uL (ref 3.77–5.28)
RDW: 13.4 % (ref 12.3–15.4)
WBC: 5 10*3/uL (ref 3.4–10.8)

## 2016-01-22 LAB — CMP14+EGFR
ALBUMIN: 4.4 g/dL (ref 3.5–5.5)
ALK PHOS: 134 IU/L — AB (ref 39–117)
ALT: 39 IU/L — ABNORMAL HIGH (ref 0–32)
AST: 28 IU/L (ref 0–40)
Albumin/Globulin Ratio: 1.7 (ref 1.2–2.2)
BUN / CREAT RATIO: 17 (ref 9–23)
BUN: 15 mg/dL (ref 6–24)
Bilirubin Total: 0.5 mg/dL (ref 0.0–1.2)
CALCIUM: 9.6 mg/dL (ref 8.7–10.2)
CO2: 25 mmol/L (ref 18–29)
Chloride: 102 mmol/L (ref 96–106)
Creatinine, Ser: 0.87 mg/dL (ref 0.57–1.00)
GFR calc Af Amer: 85 mL/min/{1.73_m2} (ref >59)
GFR, EST NON AFRICAN AMERICAN: 74 mL/min/{1.73_m2} (ref >59)
GLOBULIN, TOTAL: 2.6 g/dL (ref 1.5–4.5)
Glucose: 106 mg/dL — ABNORMAL HIGH (ref 65–99)
Potassium: 4.7 mmol/L (ref 3.5–5.2)
SODIUM: 143 mmol/L (ref 134–144)
Total Protein: 7 g/dL (ref 6.0–8.5)

## 2016-01-22 LAB — LIPID PANEL
CHOL/HDL RATIO: 3 ratio (ref 0.0–4.4)
Cholesterol, Total: 181 mg/dL (ref 100–199)
HDL: 60 mg/dL (ref >39)
LDL Calculated: 103 mg/dL — ABNORMAL HIGH (ref 0–99)
TRIGLYCERIDES: 89 mg/dL (ref 0–149)
VLDL Cholesterol Cal: 18 mg/dL (ref 5–40)

## 2016-01-22 LAB — TSH: TSH: 1.94 u[IU]/mL (ref 0.450–4.500)

## 2016-03-07 ENCOUNTER — Ambulatory Visit (INDEPENDENT_AMBULATORY_CARE_PROVIDER_SITE_OTHER): Payer: BLUE CROSS/BLUE SHIELD | Admitting: Nurse Practitioner

## 2016-03-07 ENCOUNTER — Encounter: Payer: Self-pay | Admitting: Nurse Practitioner

## 2016-03-07 VITALS — BP 139/73 | HR 55 | Temp 97.3°F | Ht 64.0 in | Wt 183.0 lb

## 2016-03-07 DIAGNOSIS — H109 Unspecified conjunctivitis: Secondary | ICD-10-CM | POA: Diagnosis not present

## 2016-03-07 MED ORDER — POLYMYXIN B-TRIMETHOPRIM 10000-0.1 UNIT/ML-% OP SOLN: 2.0000 [drp] | OPHTHALMIC | 0 refills | Status: DC

## 2016-03-07 NOTE — Patient Instructions (Signed)

## 2016-03-07 NOTE — Progress Notes (Signed)
   Subjective:    Patient ID: Kayla Romero, female    DOB: [birth year], 60 y.o.   MRN: [id number]  Patient in today c/o:  Eye Pain   The left eye is affected. This is a new problem. The current episode started today. The problem occurs constantly. The problem has been unchanged. There was no injury mechanism. The pain is at a severity of 2/10. The pain is mild. There is no known exposure to pink eye. She does not wear contacts. Associated symptoms include blurred vision. She has tried nothing for the symptoms. Improvement on treatment: cheek feel swollen.      Review of Systems  Constitutional: Negative.   HENT: Negative.   Eyes: Positive for blurred vision and pain.  Respiratory: Negative.   Cardiovascular: Negative.   Genitourinary: Negative.   Musculoskeletal: Negative.   Neurological: Negative for dizziness and headaches.  Psychiatric/Behavioral: Negative.   All other systems reviewed and are negative.      Objective:   Physical Exam  Constitutional: She is oriented to person, place, and time. She appears well-developed and well-nourished.  HENT:  Right Ear: Hearing, tympanic membrane, external ear and ear canal normal.  Left Ear: Hearing, tympanic membrane, external ear and ear canal normal.  Nose: No mucosal edema or rhinorrhea. Right sinus exhibits no maxillary sinus tenderness and no frontal sinus tenderness. Left sinus exhibits no maxillary sinus tenderness and no frontal sinus tenderness.  Mouth/Throat: Uvula is midline, oropharynx is clear and moist and mucous membranes are normal.  Left mild cheek edema- no tenderness  Eyes: Pupils are equal, round, and reactive to light. Left eye exhibits discharge (yellowish green).  Injected sclera  Neck: Normal range of motion. Neck supple.  Cardiovascular: Normal rate, regular rhythm and normal heart sounds.   Pulmonary/Chest: Effort normal and breath sounds normal.  Neurological: She is alert and oriented to person, place,  and time. She has normal reflexes.  Skin: Skin is warm.  Psychiatric: She has a normal mood and affect. Her behavior is normal. Judgment and thought content normal.   BP 139/73   Pulse (!) 55   Temp 97.3 F (36.3 C) (Oral)   Ht 5\' 4"  (1.626 m)   Wt 183 lb (83 kg)   LMP 11/26/2010   BMI 31.41 kg/m        Assessment & Plan:  1. Conjunctivitis of left eye Avoid rubbing eye Cool compresses Good hand washing RTO prn  Meds ordered this encounter  Medications  . trimethoprim-polymyxin b (POLYTRIM) ophthalmic solution    Sig: Place 2 drops into both eyes every 4 (four) hours.    Dispense:  10 mL    Refill:  0    Order Specific Question:   Supervising Provider    Answer:   Eustaquio Maize [[id number]]    Mary-Margaret Hassell Done, FNP

## 2016-03-14 DIAGNOSIS — Z01419 Encounter for gynecological examination (general) (routine) without abnormal findings: Secondary | ICD-10-CM | POA: Diagnosis not present

## 2016-03-14 DIAGNOSIS — Z6832 Body mass index (BMI) 32.0-32.9, adult: Secondary | ICD-10-CM | POA: Diagnosis not present

## 2016-03-14 DIAGNOSIS — Z1231 Encounter for screening mammogram for malignant neoplasm of breast: Secondary | ICD-10-CM | POA: Diagnosis not present

## 2016-04-21 ENCOUNTER — Ambulatory Visit (INDEPENDENT_AMBULATORY_CARE_PROVIDER_SITE_OTHER): Payer: BLUE CROSS/BLUE SHIELD

## 2016-04-21 ENCOUNTER — Other Ambulatory Visit: Payer: Self-pay | Admitting: Family Medicine

## 2016-04-21 DIAGNOSIS — Z23 Encounter for immunization: Secondary | ICD-10-CM | POA: Diagnosis not present

## 2016-06-27 ENCOUNTER — Ambulatory Visit (INDEPENDENT_AMBULATORY_CARE_PROVIDER_SITE_OTHER): Payer: BLUE CROSS/BLUE SHIELD | Admitting: Family

## 2016-06-27 ENCOUNTER — Encounter: Payer: Self-pay | Admitting: Family

## 2016-06-27 VITALS — BP 132/70 | HR 69 | Temp 97.0°F | Ht 64.0 in | Wt 189.8 lb

## 2016-06-27 DIAGNOSIS — J069 Acute upper respiratory infection, unspecified: Secondary | ICD-10-CM | POA: Diagnosis not present

## 2016-06-27 MED ORDER — BENZONATATE 200 MG PO CAPS: 200.0000 mg | ORAL_CAPSULE | Freq: Three times a day (TID) | ORAL | 1 refills | Status: DC | PRN

## 2016-06-27 MED ORDER — PREDNISONE 10 MG (21) PO TBPK: ORAL_TABLET | ORAL | 0 refills | Status: DC

## 2016-06-27 MED ORDER — FLUTICASONE PROPIONATE 50 MCG/ACT NA SUSP: 2.0000 | Freq: Every day | NASAL | 6 refills | Status: DC

## 2016-06-27 NOTE — Patient Instructions (Signed)
Upper Respiratory Infection, Adult Most upper respiratory infections (URIs) are a viral infection of the air passages leading to the lungs. A URI affects the nose, throat, and upper air passages. The most common type of URI is nasopharyngitis and is typically referred to as "the common cold." URIs run their course and usually go away on their own. Most of the time, a URI does not require medical attention, but sometimes a bacterial infection in the upper airways can follow a viral infection. This is called a secondary infection. Sinus and middle ear infections are common types of secondary upper respiratory infections. Bacterial pneumonia can also complicate a URI. A URI can worsen asthma and chronic obstructive pulmonary disease (COPD). Sometimes, these complications can require emergency medical care and may be life threatening. What are the causes? Almost all URIs are caused by viruses. A virus is a type of germ and can spread from one person to another. What increases the risk? You may be at risk for a URI if:  You smoke.  You have chronic heart or lung disease.  You have a weakened defense (immune) system.  You are very young or very old.  You have nasal allergies or asthma.  You work in crowded or poorly ventilated areas.  You work in health care facilities or schools.  What are the signs or symptoms? Symptoms typically develop 2-3 days after you come in contact with a cold virus. Most viral URIs last 7-10 days. However, viral URIs from the influenza virus (flu virus) can last 14-18 days and are typically more severe. Symptoms may include:  Runny or stuffy (congested) nose.  Sneezing.  Cough.  Sore throat.  Headache.  Fatigue.  Fever.  Loss of appetite.  Pain in your forehead, behind your eyes, and over your cheekbones (sinus pain).  Muscle aches.  How is this diagnosed? Your health care provider may diagnose a URI by:  Physical exam.  Tests to check that your  symptoms are not due to another condition such as: ? Strep throat. ? Sinusitis. ? Pneumonia. ? Asthma.  How is this treated? A URI goes away on its own with time. It cannot be cured with medicines, but medicines may be prescribed or recommended to relieve symptoms. Medicines may help:  Reduce your fever.  Reduce your cough.  Relieve nasal congestion.  Follow these instructions at home:  Take medicines only as directed by your health care provider.  Gargle warm saltwater or take cough drops to comfort your throat as directed by your health care provider.  Use a warm mist humidifier or inhale steam from a shower to increase air moisture. This may make it easier to breathe.  Drink enough fluid to keep your urine clear or pale yellow.  Eat soups and other clear broths and maintain good nutrition.  Rest as needed.  Return to work when your temperature has returned to normal or as your health care provider advises. You may need to stay home longer to avoid infecting others. You can also use a face mask and careful hand washing to prevent spread of the virus.  Increase the usage of your inhaler if you have asthma.  Do not use any tobacco products, including cigarettes, chewing tobacco, or electronic cigarettes. If you need help quitting, ask your health care provider. How is this prevented? The best way to protect yourself from getting a cold is to practice good hygiene.  Avoid oral or hand contact with people with cold symptoms.  Wash your   hands often if contact occurs.  There is no clear evidence that vitamin C, vitamin E, echinacea, or exercise reduces the chance of developing a cold. However, it is always recommended to get plenty of rest, exercise, and practice good nutrition. Contact a health care provider if:  You are getting worse rather than better.  Your symptoms are not controlled by medicine.  You have chills.  You have worsening shortness of breath.  You have  brown or red mucus.  You have yellow or brown nasal discharge.  You have pain in your face, especially when you bend forward.  You have a fever.  You have swollen neck glands.  You have pain while swallowing.  You have white areas in the back of your throat. Get help right away if:  You have severe or persistent: ? Headache. ? Ear pain. ? Sinus pain. ? Chest pain.  You have chronic lung disease and any of the following: ? Wheezing. ? Prolonged cough. ? Coughing up blood. ? A change in your usual mucus.  You have a stiff neck.  You have changes in your: ? Vision. ? Hearing. ? Thinking. ? Mood. This information is not intended to replace advice given to you by your health care provider. Make sure you discuss any questions you have with your health care provider. Document Released: 12/10/2000 Document Revised: 02/17/2016 Document Reviewed: 09/21/2013 Elsevier Interactive Patient Education  2017 Elsevier Inc.  

## 2016-06-27 NOTE — Addendum Note (Signed)
Addended by: Evelina Dun A on: 06/27/2016 10:44 AM   Modules accepted: Orders

## 2016-06-27 NOTE — Progress Notes (Addendum)
   Subjective:    Patient ID: Kayla Romero, female    DOB: 09/23/1955, 60 y.o.   MRN: [id number]  Cough  Associated symptoms include ear pain, headaches and a sore throat.  Sinus Problem  This is a new problem. The current episode started in the past 7 days. The problem has been gradually worsening since onset. There has been no fever. Her pain is at a severity of 7/10. The pain is mild. Associated symptoms include congestion, coughing, ear pain, headaches, a hoarse voice, sneezing and a sore throat. Pertinent negatives include no sinus pressure. Past treatments include nothing. The treatment provided no relief.      Review of Systems  HENT: Positive for congestion, ear pain, hoarse voice, sneezing and sore throat. Negative for sinus pressure.   Respiratory: Positive for cough.   Neurological: Positive for headaches.  All other systems reviewed and are negative.      Objective:   Physical Exam  Constitutional: She is oriented to person, place, and time. She appears well-developed and well-nourished. No distress.  HENT:  Head: Normocephalic and atraumatic.  Right Ear: External ear normal.  Left Ear: External ear normal.  Nose: Mucosal edema and rhinorrhea present.  Mouth/Throat: Posterior oropharyngeal erythema present.  Eyes: Pupils are equal, round, and reactive to light.  Neck: Normal range of motion. Neck supple. No thyromegaly present.  Cardiovascular: Normal rate, regular rhythm, normal heart sounds and intact distal pulses.   No murmur heard. Pulmonary/Chest: Effort normal and breath sounds normal. No respiratory distress. She has no wheezes.  Dry constant  Abdominal: Soft. Bowel sounds are normal. She exhibits no distension. There is no tenderness.  Musculoskeletal: Normal range of motion. She exhibits no edema or tenderness.  Neurological: She is alert and oriented to person, place, and time.  Skin: Skin is warm and dry.  Psychiatric: She has a normal mood and affect.  Her behavior is normal. Judgment and thought content normal.  Vitals reviewed.     BP 132/70   Pulse 69   Temp 97 F (36.1 C) (Oral)   Ht 5\' 4"  (1.626 m)   Wt 189 lb 12.8 oz (86.1 kg)   LMP 11/26/2010   BMI 32.58 kg/m      Assessment & Plan:  1. Acute upper respiratory infection -- Take meds as prescribed - Use a cool mist humidifier  -Use saline nose sprays frequently -Saline irrigations of the nose can be very helpful if done frequently.  * 4X daily for 1 week*  * Use of a nettie pot can be helpful with this. Follow directions with this* -Force fluids -For any cough or congestion  Use plain Mucinex- regular strength or max strength is fine   * Children- consult with Pharmacist for dosing -For fever or aces or pains- take tylenol or ibuprofen appropriate for age and weight.  * for fevers greater than 101 orally you may alternate ibuprofen and tylenol every  3 hours. -Throat lozenges if help -New toothbrush in 3 days - benzonatate (TESSALON) 200 MG capsule; Take 1 capsule (200 mg total) by mouth 3 (three) times daily as needed.  Dispense: 30 capsule; Refill: 1 - fluticasone (FLONASE) 50 MCG/ACT nasal spray; Place 2 sprays into both nostrils daily.  Dispense: 16 g; Refill: 6 - predniSONE (STERAPRED UNI-PAK 21 TAB) 10 MG (21) TBPK tablet; Use as directed  Dispense: 21 tablet; Refill: 0  Evelina Dun, FNP

## 2016-07-01 ENCOUNTER — Encounter: Payer: Self-pay | Admitting: Family Medicine

## 2016-07-01 ENCOUNTER — Ambulatory Visit (INDEPENDENT_AMBULATORY_CARE_PROVIDER_SITE_OTHER): Payer: BLUE CROSS/BLUE SHIELD | Admitting: Family Medicine

## 2016-07-01 VITALS — BP 130/82 | HR 71 | Temp 97.1°F | Ht 64.0 in | Wt 189.4 lb

## 2016-07-01 DIAGNOSIS — F419 Anxiety disorder, unspecified: Secondary | ICD-10-CM | POA: Diagnosis not present

## 2016-07-01 DIAGNOSIS — H10023 Other mucopurulent conjunctivitis, bilateral: Secondary | ICD-10-CM

## 2016-07-01 DIAGNOSIS — M7730 Calcaneal spur, unspecified foot: Secondary | ICD-10-CM | POA: Insufficient documentation

## 2016-07-01 MED ORDER — ALPRAZOLAM 0.5 MG PO TABS: 0.5000 mg | ORAL_TABLET | Freq: Every evening | ORAL | 0 refills | Status: DC | PRN

## 2016-07-01 MED ORDER — POLYMYXIN B-TRIMETHOPRIM 10000-0.1 UNIT/ML-% OP SOLN: 2.0000 [drp] | Freq: Four times a day (QID) | OPHTHALMIC | 0 refills | Status: DC

## 2016-07-01 NOTE — Progress Notes (Signed)
   HPI  Patient presents today here with concern for pinkeye, anxiety, and heel spurs.  Pinkeye Symptoms started 2-3 days ago, after she woke up with her right eye matted shut and with conjunctival injection, her left eye has been matted shut this morning, and her symptoms of UTIs with irritation has persisted. She's been treated for bronchitis, however has not started her prednisone.  Her cough is persistent and she is to start prednisone tomorrow. She's tolerating food and fluids normally.  She denies any fevers, chills, sweats.  Patient would like a refill of Xanax and tramadol. She uses Xanax occasionally for panic attacks, Wellbutrin helps most the time.  Tramadol is used for heel spur related pain. This is also very when necessary. She is trying ice, heel cups, padded shoes. It's worse when she exercises, she wants to start exercising again.  PMH: Smoking status noted ROS: Per HPI  Objective: BP 130/82   Pulse 71   Temp 97.1 F (36.2 C) (Oral)   Ht 5\' 4"  (1.626 m)   Wt 189 lb 6.4 oz (85.9 kg)   LMP 11/26/2010   BMI 32.51 kg/m  Gen: NAD, alert, cooperative with exam HEENT: NCAT, bilateral conjunctival injection left much greater than right, normal pupillary response, no obvious drainage. CV: RRR, good S1/S2, no murmur Resp: CTABL, no wheezes, non-labored Ext: No edema, warm Neuro: Alert and oriented, No gross deficits  Assessment and plan:  # Pinkeye Treat with Polytrim drops Skeletal viral, however cover for bacterial conjunctivitis with Polytrim Start prednisone as previously planned  # Anxiety Recently well controlled currently, however and SSRI would likely be more beneficial than Wellbutrin. Refilled Xanax, she has only used 30 pills since March.   # Heel spurs Recommended stopping tramadol given that she's been treated with benzodiazepines as well as opiates. Discussed that we are trying to stop the combination of using both opiates and  benzodiazepines. Recommended nonopiate treatment like Aleve only as needed. Also discussed supportive care Discontinue tramadol.    Meds ordered this encounter  Medications  . ALPRAZolam (XANAX) 0.5 MG tablet    Sig: Take 1 tablet (0.5 mg total) by mouth at bedtime as needed (panic attack).    Dispense:  30 tablet    Refill:  0  . trimethoprim-polymyxin b (POLYTRIM) ophthalmic solution    Sig: Place 2 drops into both eyes every 6 (six) hours.    Dispense:  10 mL    Refill:  0    Laroy Apple, MD [facility] 07/01/2016, 12:50 PM

## 2016-07-01 NOTE — Patient Instructions (Signed)
Great to see you!   Bacterial Conjunctivitis Introduction Bacterial conjunctivitis is an infection of your conjunctiva. This is the clear membrane that covers the white part of your eye and the inner surface of your eyelid. This condition can make your eye:  Red or pink.  Itchy. This condition is caused by bacteria. This condition spreads very easily from person to person (is contagious) and from one eye to the other eye. Follow these instructions at home: Medicines  Take or apply your antibiotic medicine as told by your doctor. Do not stop taking or applying the antibiotic even if you start to feel better.  Take or apply over-the-counter and prescription medicines only as told by your doctor.  Do not touch your eyelid with the eye drop bottle or the ointment tube. Managing discomfort  Wipe any fluid from your eye with a warm, wet washcloth or a cotton ball.  Place a cool, clean washcloth on your eye. Do this for 10-20 minutes, 3-4 times per day. General instructions  Do not wear contact lenses until the irritation is gone. Wear glasses until your doctor says it is okay to wear contacts.  Do not wear eye makeup until your symptoms are gone. Throw away any old makeup.  Change or wash your pillowcase every day.  Do not share towels or washcloths with anyone.  Wash your hands often with soap and water. Use paper towels to dry your hands.  Do not touch or rub your eyes.  Do not drive or use heavy machinery if your vision is blurry. Contact a doctor if:  You have a fever.  Your symptoms do not get better after 10 days. Get help right away if:  You have a fever and your symptoms suddenly get worse.  You have very bad pain when you move your eye.  Your face:  Hurts.  Is red.  Is swollen.  You have sudden loss of vision. This information is not intended to replace advice given to you by your health care provider. Make sure you discuss any questions you have with  your health care provider. Document Released: 03/25/2008 Document Revised: 11/22/2015 Document Reviewed: 03/29/2015  2017 Elsevier

## 2016-07-20 ENCOUNTER — Other Ambulatory Visit: Payer: Self-pay | Admitting: Nurse Practitioner

## 2016-09-16 ENCOUNTER — Encounter: Payer: Self-pay | Admitting: Family Medicine

## 2016-09-16 ENCOUNTER — Ambulatory Visit (INDEPENDENT_AMBULATORY_CARE_PROVIDER_SITE_OTHER): Payer: BLUE CROSS/BLUE SHIELD | Admitting: Family Medicine

## 2016-09-16 VITALS — BP 136/80 | HR 70 | Temp 97.0°F | Ht 64.0 in | Wt 192.4 lb

## 2016-09-16 DIAGNOSIS — M5410 Radiculopathy, site unspecified: Secondary | ICD-10-CM | POA: Diagnosis not present

## 2016-09-16 NOTE — Progress Notes (Signed)
   HPI  Patient presents today here leg pain.  Patient explains over the last 4-5 days she's developed a burning type feeling that started on her dorsal surface of her foot then began to radiate up the anterior part of her shin, wraparound the medial part of her right knee, and now is some behind her leg.  PMH: Smoking status noted ROS: Per HPI  Objective: BP 136/80   Pulse 70   Temp 97 F (36.1 C) (Oral)   Ht 5\' 4"  (1.626 m)   Wt 192 lb 6.4 oz (87.3 kg)   LMP 11/26/2010   BMI 33.03 kg/m  Gen: NAD, alert, cooperative with exam HEENT: NCAT, EOMI, PERRL CV: RRR, good S1/S2, no murmur Resp: CTABL, no wheezes, non-labored Ext: No edema, warm- Calf circ 43 cm on L 42 cm on R Neuro: Alert and oriented  MSK: R knee without erythema, effusion, bruising, or gross deformity No joint line tenderness.  ligamentously intact to Lachman's and with varus and valgus stress.  Slight tenderness with McMurray's test  Negative Homans sign and no calf tenderness   Assessment and plan:  # Radicular leg pain Pain in distribution of L3/L4/L5 nerves. No rash to indicate developing shingles, no back pain, no tenderness to palpation of paraspinal muscles in the lumbar area or midline tenderness. Possibly due to subsequent knee pain as well. Offered IM Depo-Medrol, offered gabapentin. Patient would like to try NSAIDs and follow up if needed. Patient was primarily concerned about blood clot, she does not have any signs of DVT that I can find, he is also at average risk for DVT.   Meds ordered this encounter  Medications  . UNABLE TO FIND    Sig: [place]  . Polyethyl Glycol-Propyl Glycol (SYSTANE ULTRA OP)    Sig: Apply to eye.    Laroy Apple, MD [facility] 09/16/2016, 8:45 AM

## 2016-09-16 NOTE — Patient Instructions (Signed)
Great to see you!  Try 2 aleve twice daily ( with food) for 5-7 days.   If you develop any swelling, redness, or rash of the leg please let us know.   Come back in the summer for follow up and labs ( yours were done in July)

## 2016-10-15 ENCOUNTER — Other Ambulatory Visit: Payer: Self-pay | Admitting: Family Medicine

## 2017-01-12 ENCOUNTER — Other Ambulatory Visit: Payer: Self-pay | Admitting: Family Medicine

## 2017-01-14 NOTE — Telephone Encounter (Signed)
na

## 2017-01-15 DIAGNOSIS — H353131 Nonexudative age-related macular degeneration, bilateral, early dry stage: Secondary | ICD-10-CM | POA: Diagnosis not present

## 2017-01-15 DIAGNOSIS — Z961 Presence of intraocular lens: Secondary | ICD-10-CM | POA: Diagnosis not present

## 2017-01-15 DIAGNOSIS — H04123 Dry eye syndrome of bilateral lacrimal glands: Secondary | ICD-10-CM | POA: Diagnosis not present

## 2017-03-25 DIAGNOSIS — Z6833 Body mass index (BMI) 33.0-33.9, adult: Secondary | ICD-10-CM | POA: Diagnosis not present

## 2017-03-25 DIAGNOSIS — Z01419 Encounter for gynecological examination (general) (routine) without abnormal findings: Secondary | ICD-10-CM | POA: Diagnosis not present

## 2017-03-25 DIAGNOSIS — Z13820 Encounter for screening for osteoporosis: Secondary | ICD-10-CM | POA: Diagnosis not present

## 2017-03-25 DIAGNOSIS — Z1231 Encounter for screening mammogram for malignant neoplasm of breast: Secondary | ICD-10-CM | POA: Diagnosis not present

## 2017-03-27 ENCOUNTER — Ambulatory Visit: Payer: BLUE CROSS/BLUE SHIELD | Admitting: Family Medicine

## 2017-04-01 ENCOUNTER — Encounter: Payer: Self-pay | Admitting: Family Medicine

## 2017-04-01 ENCOUNTER — Ambulatory Visit (INDEPENDENT_AMBULATORY_CARE_PROVIDER_SITE_OTHER): Payer: BLUE CROSS/BLUE SHIELD | Admitting: Family Medicine

## 2017-04-01 VITALS — BP 130/81 | HR 54 | Temp 97.0°F | Ht 64.0 in | Wt 186.4 lb

## 2017-04-01 DIAGNOSIS — F419 Anxiety disorder, unspecified: Secondary | ICD-10-CM

## 2017-04-01 DIAGNOSIS — E669 Obesity, unspecified: Secondary | ICD-10-CM

## 2017-04-01 DIAGNOSIS — E559 Vitamin D deficiency, unspecified: Secondary | ICD-10-CM | POA: Diagnosis not present

## 2017-04-01 DIAGNOSIS — Z23 Encounter for immunization: Secondary | ICD-10-CM | POA: Diagnosis not present

## 2017-04-01 MED ORDER — BUPROPION HCL ER (XL) 150 MG PO TB24: 150.0000 mg | ORAL_TABLET | Freq: Every morning | ORAL | 3 refills | Status: DC

## 2017-04-01 MED ORDER — ALPRAZOLAM 0.5 MG PO TABS: 0.5000 mg | ORAL_TABLET | Freq: Every evening | ORAL | 0 refills | Status: DC | PRN

## 2017-04-01 NOTE — Patient Instructions (Signed)
Great to see you!  Come back in 1 year unless you need us sooner.    

## 2017-04-01 NOTE — Progress Notes (Signed)
   HPI  Patient presents today here to follow-up for chronic medical conditions Anxiety and depression Depression well controlled with Wellbutrin, no SI. Anxiety, using Xanax occasionally.  Obesity Doing well currently, patient is walking 3-4 times a week, she's also watching her diet.  Patient has gotten a mammogram, Pap smear, and DEXA scan last week with her GYN.   PMH: Smoking status noted ROS: Per HPI  Objective: BP 130/81   Pulse (!) 54   Temp (!) 97 F (36.1 C) (Oral)   Ht '5\' 4"'$  (1.626 m)   Wt 186 lb 6.4 oz (84.6 kg)   LMP 11/26/2010   BMI 32.00 kg/m  Gen: NAD, alert, cooperative with exam HEENT: NCAT CV: RRR, good S1/S2, no murmur Resp: CTABL, no wheezes, non-labored Ext: No edema, warm Neuro: Alert and oriented, No gross deficits  Assessment and plan:  # Anxiety, depression Stable, doing well Refilled Xanax,, and Wellbutrin  # Obesity Improved, congratulated on lifestyle changes  # Vitamin D deficiency DEXA scan by GYN, repeat labs today  # Need for influenza vaccination Counseling provided for all vaccine components    Orders Placed This Encounter  Procedures  . CMP14+EGFR  . CBC with Differential/Platelet  . Lipid panel  . TSH  . VITAMIN D 25 Hydroxy (Vit-D Deficiency, Fractures)    Meds ordered this encounter  Medications  . buPROPion (WELLBUTRIN XL) 150 MG 24 hr tablet    Sig: Take 1 tablet (150 mg total) by mouth every morning.    Dispense:  90 tablet    Refill:  3  . ALPRAZolam (XANAX) 0.5 MG tablet    Sig: Take 1 tablet (0.5 mg total) by mouth at bedtime as needed (panic attack).    Dispense:  30 tablet    Refill:  0    Laroy Apple, MD [facility] 04/01/2017, 8:57 AM

## 2017-04-02 LAB — CBC WITH DIFFERENTIAL/PLATELET
BASOS ABS: 0 10*3/uL (ref 0.0–0.2)
Basos: 1 %
EOS (ABSOLUTE): 0.3 10*3/uL (ref 0.0–0.4)
Eos: 4 %
HEMATOCRIT: 39.1 % (ref 34.0–46.6)
HEMOGLOBIN: 12.6 g/dL (ref 11.1–15.9)
Immature Grans (Abs): 0 10*3/uL (ref 0.0–0.1)
Immature Granulocytes: 0 %
LYMPHS ABS: 3.3 10*3/uL — AB (ref 0.7–3.1)
Lymphs: 43 %
MCH: 29.5 pg (ref 26.6–33.0)
MCHC: 32.2 g/dL (ref 31.5–35.7)
MCV: 92 fL (ref 79–97)
MONOCYTES: 5 %
MONOS ABS: 0.4 10*3/uL (ref 0.1–0.9)
NEUTROS ABS: 3.5 10*3/uL (ref 1.4–7.0)
Neutrophils: 47 %
Platelets: 314 10*3/uL (ref 150–379)
RBC: 4.27 x10E6/uL (ref 3.77–5.28)
RDW: 13.1 % (ref 12.3–15.4)
WBC: 7.5 10*3/uL (ref 3.4–10.8)

## 2017-04-02 LAB — LIPID PANEL
CHOL/HDL RATIO: 3.5 ratio (ref 0.0–4.4)
Cholesterol, Total: 173 mg/dL (ref 100–199)
HDL: 50 mg/dL (ref >39)
LDL CALC: 103 mg/dL — AB (ref 0–99)
Triglycerides: 101 mg/dL (ref 0–149)
VLDL Cholesterol Cal: 20 mg/dL (ref 5–40)

## 2017-04-02 LAB — CMP14+EGFR
A/G RATIO: 1.5 (ref 1.2–2.2)
ALBUMIN: 4.3 g/dL (ref 3.6–4.8)
ALK PHOS: 111 IU/L (ref 39–117)
ALT: 20 IU/L (ref 0–32)
AST: 18 IU/L (ref 0–40)
BILIRUBIN TOTAL: 0.4 mg/dL (ref 0.0–1.2)
BUN / CREAT RATIO: 15 (ref 12–28)
BUN: 12 mg/dL (ref 8–27)
CHLORIDE: 104 mmol/L (ref 96–106)
CO2: 23 mmol/L (ref 20–29)
Calcium: 9.4 mg/dL (ref 8.7–10.3)
Creatinine, Ser: 0.8 mg/dL (ref 0.57–1.00)
GFR calc non Af Amer: 80 mL/min/{1.73_m2} (ref >59)
GFR, EST AFRICAN AMERICAN: 93 mL/min/{1.73_m2} (ref >59)
GLOBULIN, TOTAL: 2.8 g/dL (ref 1.5–4.5)
GLUCOSE: 98 mg/dL (ref 65–99)
Potassium: 4.8 mmol/L (ref 3.5–5.2)
SODIUM: 143 mmol/L (ref 134–144)
TOTAL PROTEIN: 7.1 g/dL (ref 6.0–8.5)

## 2017-04-02 LAB — TSH: TSH: 3.05 u[IU]/mL (ref 0.450–4.500)

## 2017-04-02 LAB — VITAMIN D 25 HYDROXY (VIT D DEFICIENCY, FRACTURES): VIT D 25 HYDROXY: 37.4 ng/mL (ref 30.0–100.0)

## 2017-07-03 DIAGNOSIS — M8580 Other specified disorders of bone density and structure, unspecified site: Secondary | ICD-10-CM | POA: Diagnosis not present

## 2017-10-09 DIAGNOSIS — N760 Acute vaginitis: Secondary | ICD-10-CM | POA: Diagnosis not present

## 2018-01-21 ENCOUNTER — Encounter: Payer: Self-pay | Admitting: Family Medicine

## 2018-01-21 ENCOUNTER — Ambulatory Visit: Payer: BLUE CROSS/BLUE SHIELD | Admitting: Family Medicine

## 2018-01-21 VITALS — BP 139/86 | HR 59 | Temp 97.2°F | Ht 64.0 in | Wt 177.0 lb

## 2018-01-21 DIAGNOSIS — M7122 Synovial cyst of popliteal space [Baker], left knee: Secondary | ICD-10-CM

## 2018-01-21 NOTE — Progress Notes (Signed)
Subjective: CC: Knee swelling PCP: Timmothy Euler, MD OVF:IEPPIR INAS AVENA is a 62 y.o. female presenting to clinic today for:  1.  Knee swelling Patient reports that she noticed a slight fullness in the back of her left knee today.  Reports that may be there was mild discomfort but she denies any significant pain.  She is ambulating independently and without difficulty.  No locking or popping.  No discoloration, calf pain, recent travel.  She has not used anything for symptoms because she did not know what it was.  She has a history of degenerative changes in the right knee, for which she uses a knee brace.  She notes occasional numbness and tingling in the feet but nothing that is constant.  No lower extremity edema.  No increased warmth of the knee.   ROS: Per HPI  No Known Allergies Past Medical History:  Diagnosis Date  . Anxiety    h/o, recently has not had to use xanax much.  . 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 Depression   . Fatty liver   . History of EKG    first presented /w L shoulder pain, 2007, seen by [place]. , had EKG then & it was wnl.    . Mediastinal mass   . Obesity (BMI 30-39.9)   . RUQ pain   . 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 Arlyce Dice & then a Angelina Sheriff MD fr. [facility]    . 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 2007, seen by Dr. Arlyce Dice, but now followed by [facility] MD in [place]  . Vitamin D deficiency     Current Outpatient Medications:  .  ALPRAZolam (XANAX) 0.5 MG tablet, Take 1 tablet (0.5 mg total) by mouth at bedtime as needed (panic attack)., Disp: 30 tablet, Rfl: 0 .  buPROPion (WELLBUTRIN XL) 150 MG 24 hr tablet, Take 1 tablet (150 mg total) by mouth every morning., Disp: 90 tablet, Rfl: 3 .  UNABLE TO FIND, [facility], Disp: , Rfl:  .  fluticasone (FLONASE) 50 MCG/ACT nasal spray, Place 2 sprays into both nostrils daily. (Patient not taking: Reported on 01/21/2018), Disp: 16 g,  Rfl: 6 .  Polyethyl Glycol-Propyl Glycol (SYSTANE ULTRA OP), Apply to eye., Disp: , Rfl:  Social History   Socioeconomic History  . Marital status: Married    Spouse name: Not on file  . Number of children: Not on file  . Years of education: Not on file  . Highest education level: Not on file  Occupational History  . Not on file  Social Needs  . Financial resource strain: Not on file  . Food insecurity:    Worry: Not on file    Inability: Not on file  . Transportation needs:    Medical: Not on file    Non-medical: Not on file  Tobacco Use  . Smoking status: Former Smoker    Types: Cigarettes    Last attempt to quit: 05/31/2015    Years since quitting: 2.6  . Smokeless tobacco: Never Used  . Tobacco comment: quit 2 weeks ago completely. had quit in 2012 but restarted  Substance and Sexual Activity  . Alcohol use: No  . Drug use: No  . Sexual activity: Not on file  Lifestyle  . Physical activity:    Days per week: Not on file    Minutes per session: Not on file  .  Stress: Not on file  Relationships  . Social connections:    Talks on phone: Not on file    Gets together: Not on file    Attends religious service: Not on file    Active member of club or organization: Not on file    Attends meetings of clubs or organizations: Not on file    Relationship status: Not on file  . Intimate partner violence:    Fear of current or ex partner: Not on file    Emotionally abused: Not on file    Physically abused: Not on file    Forced sexual activity: Not on file  Other Topics Concern  . Not on file  Social History Narrative  . Not on file   Family History  Problem Relation Age of Onset  . Heart disease Mother   . Cirrhosis Mother   . Kidney disease Mother   . Cancer Mother        ovarian  . Aneurysm Father     Objective: Office vital signs reviewed. BP 139/86   Pulse (!) 59   Temp (!) 97.2 F (36.2 C)   Ht 5\' 4"  (1.626 m)   Wt 177 lb (80.3 kg)   LMP 11/26/2010    BMI 30.38 kg/m   Physical Examination:  General: Awake, alert, well nourished, No acute distress Extremities: warm, well perfused, No edema, cyanosis or clubbing; +2 pulses bilaterally MSK: normal gait and normal station  Left knee: No tenderness to palpation to the patella, patellar tendon or quad tendon.  Mild tenderness to palpation along the medial joint line.  No significant discoloration, swelling or joint effusion.  No significant fullness appreciated within the posterior left knee.  No palpable posterior popliteal masses.  No ligamentous laxity.  Negative Thessaly.  No tenderness to palpation along the calf.  No palpable cords.  Negative Homans.  Patient has full active range of motion of left lower extremity.  Right knee: No tenderness to palpation to the patella, patellar tendon or quad tendon.  No tenderness to palpation to the joint line.  No ligament is laxity.  No appreciable knee swelling, discoloration or effusion. Skin: dry; intact; no rashes or lesions Neuro: 5/5 LE Strength and light touch sensation grossly intact  Assessment/ Plan: 62 y.o. female   1. Baker cyst, left Patient is well-appearing.  Her physical exam was fairly unremarkable except for mild tenderness along the medial aspect of the left knee.  I suspect that she may have a small Baker's cyst.  She is essentially asymptomatic aside from subjective sensation of fullness behind the left knee.  We discussed conservative measures including oral NSAIDs, rest and potentially using a compression knee sleeve if needed.  We did discuss that should symptoms worsen or become painful that week can send her to orthopedics for further evaluation versus obtain an ultrasound to further evaluate.  There is certainly nothing on exam to suggest an acute DVT.  Wells score of 0.  Home care instructions were reviewed.  AVS was provided.  Follow-up as needed.   Janora Norlander, DO [facility] [phone]

## 2018-01-21 NOTE — Patient Instructions (Signed)
I think what you are experiencing is a Baker's Cyst.  There is no evidence of clot on your exam.  Since you are not having pain, we are holding off on ultrasound/ orthopedic referral.  If you change your mind, let me know.  Use Motrin/ Aleve if needed for pain /swelling.  Baker Cyst A Baker cyst, also called a popliteal cyst, is a sac-like growth that forms at the back of the knee. The cyst forms when the fluid-filled sac (bursa) that cushions the knee joint becomes enlarged. The bursa that becomes a Baker cyst is located at the back of the knee joint. What are the causes? In most cases, a Baker cyst results from another knee problem that causes swelling inside the knee. This makes the fluid inside the knee joint (synovial fluid) flow into the bursa behind the knee, causing the bursa to enlarge. What increases the risk? You may be more likely to develop a Baker cyst if you already have a knee problem, such as:  A tear in cartilage that cushions the knee joint (meniscal tear).  A tear in the tissues that connect the bones of the knee joint (ligament tear).  Knee swelling from osteoarthritis, rheumatoid arthritis, or gout.  What are the signs or symptoms? A Baker cyst does not always cause symptoms. A lump behind the knee may be the only sign of the condition. The lump may be painful, especially when the knee is straightened. If the lump is painful, the pain may come and go. The knee may also be stiff. Symptoms may quickly get more severe if the cyst breaks open (ruptures). If your cyst ruptures, signs and symptoms may affect the knee and the back of the lower leg (calf) and may include:  Sudden or worsening pain.  Swelling.  Bruising.  How is this diagnosed? This condition may be diagnosed based on your symptoms and medical history. Your health care provider will also do a physical exam. This may include:  Feeling the cyst to check whether it is tender.  Checking your knee for signs of  another knee condition that causes swelling.  You may have imaging tests, such as:  X-rays.  MRI.  Ultrasound.  How is this treated? A Baker cyst that is not painful may go away without treatment. If the cyst gets large or painful, it will likely get better if the underlying knee problem is treated. Treatment for a Baker cyst may include:  Resting.  Keeping weight off of the knee. This means not leaning on the knee to support your body weight.  NSAIDs to reduce pain and swelling.  A procedure to drain the fluid from the cyst with a needle (aspiration). You may also get an injection of a medicine that reduces swelling (steroid).  Surgery. This may be needed if other treatments do not work. This usually involves correcting knee damage and removing the cyst.  Follow these instructions at home:  Take over-the-counter and prescription medicines only as told by your health care provider.  Rest and return to your normal activities as told by your health care provider. Avoid activities that make pain or swelling worse. Ask your health care provider what activities are safe for you.  Keep all follow-up visits as told by your health care provider. This is important. Contact a health care provider if:  You have knee pain, stiffness, or swelling that does not get better. Get help right away if:  You have sudden or worsening pain and swelling in  your calf area. This information is not intended to replace advice given to you by your health care provider. Make sure you discuss any questions you have with your health care provider. Document Released: 06/16/2005 Document Revised: 03/06/2016 Document Reviewed: 03/06/2016 Elsevier Interactive Patient Education  2018 [employer].

## 2018-01-22 ENCOUNTER — Ambulatory Visit (INDEPENDENT_AMBULATORY_CARE_PROVIDER_SITE_OTHER): Payer: BLUE CROSS/BLUE SHIELD | Admitting: *Deleted

## 2018-01-22 DIAGNOSIS — Z23 Encounter for immunization: Secondary | ICD-10-CM

## 2018-01-22 NOTE — Progress Notes (Signed)
Pt given 3rd Hep B vaccine Tolerated well

## 2018-01-29 ENCOUNTER — Other Ambulatory Visit: Payer: Self-pay | Admitting: Family

## 2018-01-29 DIAGNOSIS — J069 Acute upper respiratory infection, unspecified: Secondary | ICD-10-CM

## 2018-02-04 ENCOUNTER — Ambulatory Visit: Payer: BLUE CROSS/BLUE SHIELD | Admitting: Family Medicine

## 2018-02-04 ENCOUNTER — Encounter: Payer: Self-pay | Admitting: Family Medicine

## 2018-02-04 VITALS — BP 138/79 | HR 64 | Temp 97.1°F | Ht 64.0 in | Wt 176.0 lb

## 2018-02-04 DIAGNOSIS — F419 Anxiety disorder, unspecified: Secondary | ICD-10-CM | POA: Diagnosis not present

## 2018-02-04 DIAGNOSIS — E669 Obesity, unspecified: Secondary | ICD-10-CM

## 2018-02-04 DIAGNOSIS — Z131 Encounter for screening for diabetes mellitus: Secondary | ICD-10-CM

## 2018-02-04 DIAGNOSIS — F3341 Major depressive disorder, recurrent, in partial remission: Secondary | ICD-10-CM | POA: Diagnosis not present

## 2018-02-04 DIAGNOSIS — Z13220 Encounter for screening for lipoid disorders: Secondary | ICD-10-CM | POA: Diagnosis not present

## 2018-02-04 LAB — CBC WITH DIFFERENTIAL/PLATELET
BASOS ABS: 0 10*3/uL (ref 0.0–0.2)
Basos: 1 %
EOS (ABSOLUTE): 0.1 10*3/uL (ref 0.0–0.4)
EOS: 1 %
Hematocrit: 39.2 % (ref 34.0–46.6)
Hemoglobin: 13.2 g/dL (ref 11.1–15.9)
IMMATURE GRANULOCYTES: 0 %
Immature Grans (Abs): 0 10*3/uL (ref 0.0–0.1)
LYMPHS ABS: 2.5 10*3/uL (ref 0.7–3.1)
Lymphs: 40 %
MCH: 30.3 pg (ref 26.6–33.0)
MCHC: 33.7 g/dL (ref 31.5–35.7)
MCV: 90 fL (ref 79–97)
MONOS ABS: 0.4 10*3/uL (ref 0.1–0.9)
Monocytes: 7 %
NEUTROS PCT: 51 %
Neutrophils Absolute: 3.1 10*3/uL (ref 1.4–7.0)
PLATELETS: 317 10*3/uL (ref 150–450)
RBC: 4.36 x10E6/uL (ref 3.77–5.28)
RDW: 12.9 % (ref 12.3–15.4)
WBC: 6.1 10*3/uL (ref 3.4–10.8)

## 2018-02-04 LAB — CMP14+EGFR
ALK PHOS: 119 IU/L — AB (ref 39–117)
ALT: 26 IU/L (ref 0–32)
AST: 19 IU/L (ref 0–40)
Albumin/Globulin Ratio: 1.8 (ref 1.2–2.2)
Albumin: 4.6 g/dL (ref 3.6–4.8)
BUN/Creatinine Ratio: 16 (ref 12–28)
BUN: 13 mg/dL (ref 8–27)
Bilirubin Total: 0.4 mg/dL (ref 0.0–1.2)
CALCIUM: 9.4 mg/dL (ref 8.7–10.3)
CO2: 24 mmol/L (ref 20–29)
CREATININE: 0.8 mg/dL (ref 0.57–1.00)
Chloride: 104 mmol/L (ref 96–106)
GFR calc Af Amer: 92 mL/min/{1.73_m2} (ref >59)
GFR, EST NON AFRICAN AMERICAN: 80 mL/min/{1.73_m2} (ref >59)
GLUCOSE: 89 mg/dL (ref 65–99)
Globulin, Total: 2.6 g/dL (ref 1.5–4.5)
Potassium: 4.4 mmol/L (ref 3.5–5.2)
Sodium: 142 mmol/L (ref 134–144)
Total Protein: 7.2 g/dL (ref 6.0–8.5)

## 2018-02-04 LAB — LIPID PANEL
CHOL/HDL RATIO: 3.1 ratio (ref 0.0–4.4)
CHOLESTEROL TOTAL: 185 mg/dL (ref 100–199)
HDL: 59 mg/dL (ref >39)
LDL CALC: 108 mg/dL — AB (ref 0–99)
TRIGLYCERIDES: 90 mg/dL (ref 0–149)
VLDL CHOLESTEROL CAL: 18 mg/dL (ref 5–40)

## 2018-02-04 MED ORDER — BUPROPION HCL ER (XL) 150 MG PO TB24: 150.0000 mg | ORAL_TABLET | Freq: Every morning | ORAL | 3 refills | Status: DC

## 2018-02-04 MED ORDER — ALPRAZOLAM 0.5 MG PO TABS: 0.5000 mg | ORAL_TABLET | Freq: Every evening | ORAL | 2 refills | Status: DC | PRN

## 2018-02-04 NOTE — Progress Notes (Signed)
BP 138/79   Pulse 64   Temp (!) 97.1 F (36.2 C) (Oral)   Ht '5\' 4"'  (1.626 m)   Wt 176 lb (79.8 kg)   LMP 11/26/2010   BMI 30.21 kg/m    Subjective:    Patient ID: Kayla Romero, female    DOB: 21-Jul-1955, 62 y.o.   MRN: [id number]  HPI: Kayla Romero is a 62 y.o. female presenting on 02/04/2018 for bakers cyst (Left-patient was seen 7/25 and would like it re checked) and Anxiety (xanax refill)   HPI Anxiety and depression, medication refill Patient is coming in today for anxiety and depression medication refill.  She is currently on Wellbutrin and says that is doing very well for her.  She says she has the alprazolam which she uses as needed but she may be used 1 or 2 a month and so she just keeps it handy only when her anxiety builds up to the point that she really needs it.  She denies any suicidal ideations or thoughts of hurting herself. Depression screen [facility] 2/9 02/04/2018 01/21/2018 04/01/2017 09/16/2016 07/01/2016  Decreased Interest 0 0 0 0 0  Down, Depressed, Hopeless 0 0 0 0 0  PHQ - 2 Score 0 0 0 0 0  Altered sleeping - - - - -  Tired, decreased energy - - - - -  Change in appetite - - - - -  Feeling bad or failure about yourself  - - - - -  Trouble concentrating - - - - -  Moving slowly or fidgety/restless - - - - -  Suicidal thoughts - - - - -  PHQ-9 Score - - - - -     Relevant past medical, surgical, family and social history reviewed and updated as indicated. Interim medical history since our last visit reviewed. Allergies and medications reviewed and updated.  Review of Systems  Constitutional: Negative for chills and fever.  Eyes: Negative for visual disturbance.  Respiratory: Negative for chest tightness and shortness of breath.   Cardiovascular: Negative for chest pain and leg swelling.  Musculoskeletal: Negative for back pain and gait problem.  Skin: Negative for rash.  Neurological: Negative for light-headedness and headaches.  Psychiatric/Behavioral:  Negative for agitation, behavioral problems, decreased concentration, dysphoric mood, self-injury, sleep disturbance and suicidal ideas. The patient is nervous/anxious.   All other systems reviewed and are negative.   Per HPI unless specifically indicated above   Allergies as of 02/04/2018   No Known Allergies     Medication List        Accurate as of 02/04/18 10:24 AM. Always use your most recent med list.          ALPRAZolam 0.5 MG tablet Commonly known as:  XANAX Take 1 tablet (0.5 mg total) by mouth at bedtime as needed (panic attack).   buPROPion 150 MG 24 hr tablet Commonly known as:  WELLBUTRIN XL Take 1 tablet (150 mg total) by mouth every morning.   fluticasone 50 MCG/ACT nasal spray Commonly known as:  FLONASE USE 2 SPRAYS IN EACH NOSTRIL DAILY   SYSTANE ULTRA OP Apply to eye.   UNABLE TO FIND [facility]          Objective:    BP 138/79   Pulse 64   Temp (!) 97.1 F (36.2 C) (Oral)   Ht '5\' 4"'  (1.626 m)   Wt 176 lb (79.8 kg)   LMP 11/26/2010   BMI 30.21 kg/m   Wt Readings from  Last 3 Encounters:  02/04/18 176 lb (79.8 kg)  01/21/18 177 lb (80.3 kg)  04/01/17 186 lb 6.4 oz (84.6 kg)    Physical Exam  Constitutional: She is oriented to person, place, and time. She appears well-developed and well-nourished. No distress.  Eyes: Conjunctivae are normal.  Neck: Neck supple. No thyromegaly present.  Cardiovascular: Normal rate, regular rhythm, normal heart sounds and intact distal pulses.  No murmur heard. Pulmonary/Chest: Effort normal and breath sounds normal. No respiratory distress. She has no wheezes.  Musculoskeletal: Normal range of motion. She exhibits no edema.  Lymphadenopathy:    She has no cervical adenopathy.  Neurological: She is alert and oriented to person, place, and time. Coordination normal.  Skin: Skin is warm and dry. No rash noted. She is not diaphoretic.  Psychiatric: Her behavior is normal. Her mood appears anxious. She does  not exhibit a depressed mood. She expresses no suicidal ideation. She expresses no suicidal plans.  Nursing note and vitals reviewed.     Assessment & Plan:   Problem List Items Addressed This Visit      Other   Anxiety   Relevant Medications   ALPRAZolam (XANAX) 0.5 MG tablet   buPROPion (WELLBUTRIN XL) 150 MG 24 hr tablet   Other Relevant Orders   CBC with Differential/Platelet   Depression - Primary   Relevant Medications   ALPRAZolam (XANAX) 0.5 MG tablet   buPROPion (WELLBUTRIN XL) 150 MG 24 hr tablet   Other Relevant Orders   CBC with Differential/Platelet   Obesity (BMI 30.0-34.9)   Relevant Orders   Lipid panel    Other Visit Diagnoses    Diabetes mellitus screening       Relevant Orders   CMP14+EGFR   Lipid screening       Relevant Orders   Lipid panel       Follow up plan: Return in about 6 months (around 08/07/2018), or if symptoms worsen or fail to improve, for Recheck anxiety depression.  Counseling provided for all of the vaccine components Orders Placed This Encounter  Procedures  . CBC with Differential/Platelet  . CMP14+EGFR  . Lipid panel    Caryl Pina, MD [facility] 02/04/2018, 10:24 AM

## 2018-02-16 DIAGNOSIS — H353132 Nonexudative age-related macular degeneration, bilateral, intermediate dry stage: Secondary | ICD-10-CM | POA: Diagnosis not present

## 2018-02-16 DIAGNOSIS — Z961 Presence of intraocular lens: Secondary | ICD-10-CM | POA: Diagnosis not present

## 2018-02-16 DIAGNOSIS — H04123 Dry eye syndrome of bilateral lacrimal glands: Secondary | ICD-10-CM | POA: Diagnosis not present

## 2018-04-07 ENCOUNTER — Ambulatory Visit (INDEPENDENT_AMBULATORY_CARE_PROVIDER_SITE_OTHER): Payer: BLUE CROSS/BLUE SHIELD

## 2018-04-07 DIAGNOSIS — Z6831 Body mass index (BMI) 31.0-31.9, adult: Secondary | ICD-10-CM | POA: Diagnosis not present

## 2018-04-07 DIAGNOSIS — Z801 Family history of malignant neoplasm of trachea, bronchus and lung: Secondary | ICD-10-CM | POA: Diagnosis not present

## 2018-04-07 DIAGNOSIS — Z808 Family history of malignant neoplasm of other organs or systems: Secondary | ICD-10-CM | POA: Diagnosis not present

## 2018-04-07 DIAGNOSIS — Z01419 Encounter for gynecological examination (general) (routine) without abnormal findings: Secondary | ICD-10-CM | POA: Diagnosis not present

## 2018-04-07 DIAGNOSIS — Z23 Encounter for immunization: Secondary | ICD-10-CM

## 2018-04-07 DIAGNOSIS — Z1231 Encounter for screening mammogram for malignant neoplasm of breast: Secondary | ICD-10-CM | POA: Diagnosis not present

## 2018-04-07 DIAGNOSIS — Z800 Family history of malignant neoplasm of digestive organs: Secondary | ICD-10-CM | POA: Diagnosis not present

## 2018-04-07 DIAGNOSIS — Z8041 Family history of malignant neoplasm of ovary: Secondary | ICD-10-CM | POA: Diagnosis not present

## 2018-05-26 ENCOUNTER — Telehealth: Payer: Self-pay | Admitting: *Deleted

## 2018-05-26 DIAGNOSIS — F419 Anxiety disorder, unspecified: Secondary | ICD-10-CM

## 2018-05-26 MED ORDER — ALPRAZOLAM 0.25 MG PO TABS: 0.5000 mg | ORAL_TABLET | Freq: Every evening | ORAL | 0 refills | Status: DC | PRN

## 2018-05-26 NOTE — Telephone Encounter (Signed)
Pt aware.

## 2018-05-26 NOTE — Telephone Encounter (Signed)
Fax from [facility] written 02/04/18 Alprazolam 0.5 mg #30 with 2 RFs 0.5 mg tab on back order If appropriate new Rx for 0.25 2 tab QHS prn (panic attacks)

## 2018-05-26 NOTE — Addendum Note (Signed)
Addended by: Caryl Pina on: 05/26/2018 01:14 PM   Modules accepted: Orders

## 2018-05-26 NOTE — Telephone Encounter (Signed)
I sent in replacement dose for 1 month

## 2018-06-28 ENCOUNTER — Ambulatory Visit: Payer: BLUE CROSS/BLUE SHIELD | Admitting: Pediatrics

## 2018-06-28 ENCOUNTER — Encounter: Payer: Self-pay | Admitting: Pediatrics

## 2018-06-28 VITALS — BP 118/73 | HR 81 | Temp 98.8°F | Ht 64.0 in | Wt 177.8 lb

## 2018-06-28 DIAGNOSIS — R6889 Other general symptoms and signs: Secondary | ICD-10-CM | POA: Diagnosis not present

## 2018-06-28 DIAGNOSIS — J069 Acute upper respiratory infection, unspecified: Secondary | ICD-10-CM | POA: Diagnosis not present

## 2018-06-28 LAB — VERITOR FLU A/B WAIVED
INFLUENZA A: NEGATIVE
INFLUENZA B: NEGATIVE

## 2018-06-28 NOTE — Patient Instructions (Signed)
Fever reducer and headache: tylenol and ibuprofen, can take together or alternating   Sinus pressure:  Nasal steroid such as flonase/fluticaone or nasocort daily Can also take daily antihistamine such as loratadine/claritin or cetirizine/zyrtec Decongestant such as phenylephrine, but take in the morning or may keep you awake at night  Sinus rinses/irritation: Netipot or similar with distilled water 2-3 times a day to clear out sinuses or Normal saline nasal spray  Sore throat:  Throat lozenges chloroseptic spray  Stick with bland foods Drink lots of fluids

## 2018-06-28 NOTE — Progress Notes (Signed)
  Subjective:   Patient ID: Kayla Romero, female    DOB: 08/21/1955, 62 y.o.   MRN: [id number] CC: Nasal Congestion and Cough  HPI: Kayla Romero is a 62 y.o. female   Nasal congestion, chills, fatigue, sneezing and general malaise started 2 days ago.  Having sweats, chills regularly.  No fevers that she knows of.  Not taking anything for symptoms at home yet.  Having some cough.  No shortness of breath, trouble breathing.  Relevant past medical, surgical, family and social history reviewed. Allergies and medications reviewed and updated. Social History   Tobacco Use  Smoking Status Former Smoker  . Types: Cigarettes  . Last attempt to quit: 05/31/2015  . Years since quitting: 3.0  Smokeless Tobacco Never Used  Tobacco Comment   quit 2 weeks ago completely. had quit in 2012 but restarted   ROS: Per HPI   Objective:    BP 118/73   Pulse 81   Temp 98.8 F (37.1 C) (Oral)   Ht 5\' 4"  (1.626 m)   Wt 177 lb 12.8 oz (80.6 kg)   LMP 11/26/2010   BMI 30.52 kg/m   Wt Readings from Last 3 Encounters:  06/28/18 177 lb 12.8 oz (80.6 kg)  02/04/18 176 lb (79.8 kg)  01/21/18 177 lb (80.3 kg)    Gen: NAD, alert, cooperative with exam, NCAT EYES: EOMI, no conjunctival injection, or no icterus ENT: Right TM pearly gray with layering white effusion, normal left TM, OP without erythema LYMPH: no cervical LAD CV: NRRR, normal S1/S2, no murmur, distal pulses 2+ b/l Resp: CTABL, no wheezes, normal WOB Ext: No edema, warm Neuro: Alert and oriented, strength equal b/l UE and LE, coordination grossly normal MSK: normal muscle bulk  Assessment & Plan:  Kayla Romero was seen today for nasal congestion and cough.  Diagnoses and all orders for this visit:  Acute URI Symptom care return precautions discussed.  Rapid flu negative  Flu-like symptoms -     Veritor Flu A/B Waived   Follow up plan: Return if symptoms worsen or fail to improve. [name], MD [facility]

## 2018-08-09 ENCOUNTER — Encounter: Payer: Self-pay | Admitting: Family Medicine

## 2018-08-09 ENCOUNTER — Ambulatory Visit (INDEPENDENT_AMBULATORY_CARE_PROVIDER_SITE_OTHER): Payer: BLUE CROSS/BLUE SHIELD | Admitting: Family Medicine

## 2018-08-09 VITALS — BP 117/71 | HR 54 | Temp 97.0°F | Ht 64.0 in | Wt 169.8 lb

## 2018-08-09 DIAGNOSIS — E559 Vitamin D deficiency, unspecified: Secondary | ICD-10-CM

## 2018-08-09 DIAGNOSIS — F419 Anxiety disorder, unspecified: Secondary | ICD-10-CM | POA: Diagnosis not present

## 2018-08-09 DIAGNOSIS — K760 Fatty (change of) liver, not elsewhere classified: Secondary | ICD-10-CM

## 2018-08-09 DIAGNOSIS — M7122 Synovial cyst of popliteal space [Baker], left knee: Secondary | ICD-10-CM | POA: Diagnosis not present

## 2018-08-09 DIAGNOSIS — E669 Obesity, unspecified: Secondary | ICD-10-CM | POA: Diagnosis not present

## 2018-08-09 DIAGNOSIS — F3341 Major depressive disorder, recurrent, in partial remission: Secondary | ICD-10-CM

## 2018-08-09 NOTE — Progress Notes (Signed)
BP 117/71   Pulse (!) 54   Temp (!) 97 F (36.1 C) (Oral)   Ht '5\' 4"'  (1.626 m)   Wt 169 lb 12.8 oz (77 kg)   LMP 11/26/2010   BMI 29.15 kg/m    Subjective:    Patient ID: Kayla Romero, female    DOB: 15-Feb-1956, 63 y.o.   MRN: [id number]  HPI: Kayla Romero is a 63 y.o. female presenting on 08/09/2018 for Anxiety; Depression; and Cyst (Back of left knee- patient states it has been there 3-4 months and has gotten bigger)   HPI Major depression and anxiety Patient is coming in for depression and anxiety.  Patient has been taking alprazolam and Wellbutrin.  Patient denies any suicidal ideations and says the medication is working well for and says her mood and anxiety has been controlled. Depression screen [facility] 2/9 08/09/2018 06/28/2018 02/04/2018 01/21/2018 04/01/2017  Decreased Interest 0 0 0 0 0  Down, Depressed, Hopeless 0 0 0 0 0  PHQ - 2 Score 0 0 0 0 0  Altered sleeping - - - - -  Tired, decreased energy - - - - -  Change in appetite - - - - -  Feeling bad or failure about yourself  - - - - -  Trouble concentrating - - - - -  Moving slowly or fidgety/restless - - - - -  Suicidal thoughts - - - - -  PHQ-9 Score - - - - -    Patient is still complaining of Baker cysts on her left knee and says it has got slightly increased.  She has been having arthritis in that knee but denies any significant pain just says it gets tight in that knee.  Patient denies any popping or giving way  We discussed obesity and fatty liver disease as it showed on the patient's last scan, we also discussed weight loss.  Patient will make lifestyle changes  Patient is also coming in for vitamin D recheck  Relevant past medical, surgical, family and social history reviewed and updated as indicated. Interim medical history since our last visit reviewed. Allergies and medications reviewed and updated.  Review of Systems  Constitutional: Negative for chills and fever.  HENT: Negative for congestion,  ear discharge and ear pain.   Eyes: Negative for redness and visual disturbance.  Respiratory: Negative for chest tightness and shortness of breath.   Cardiovascular: Negative for chest pain and leg swelling.  Genitourinary: Negative for difficulty urinating and dysuria.  Musculoskeletal: Positive for joint swelling. Negative for back pain and gait problem.  Skin: Negative for rash.  Neurological: Negative for light-headedness and headaches.  Psychiatric/Behavioral: Negative for agitation, behavioral problems, dysphoric mood, self-injury, sleep disturbance and suicidal ideas. The patient is not nervous/anxious.   All other systems reviewed and are negative.   Per HPI unless specifically indicated above   Allergies as of 08/09/2018   No Known Allergies     Medication List       Accurate as of August 09, 2018  8:42 AM. Always use your most recent med list.        ALPRAZolam 0.25 MG tablet Commonly known as:  XANAX Take 2 tablets (0.5 mg total) by mouth at bedtime as needed (panic attack).   buPROPion 150 MG 24 hr tablet Commonly known as:  WELLBUTRIN XL Take 1 tablet (150 mg total) by mouth every morning.   fluticasone 50 MCG/ACT nasal spray Commonly known as:  FLONASE USE 2 SPRAYS  IN EACH NOSTRIL DAILY   SYSTANE ULTRA OP Apply to eye.   UNABLE TO FIND [facility]          Objective:    BP 117/71   Pulse (!) 54   Temp (!) 97 F (36.1 C) (Oral)   Ht '5\' 4"'  (1.626 m)   Wt 169 lb 12.8 oz (77 kg)   LMP 11/26/2010   BMI 29.15 kg/m   Wt Readings from Last 3 Encounters:  08/09/18 169 lb 12.8 oz (77 kg)  06/28/18 177 lb 12.8 oz (80.6 kg)  02/04/18 176 lb (79.8 kg)    Physical Exam Vitals signs and nursing note reviewed.  Constitutional:      General: She is not in acute distress.    Appearance: She is well-developed. She is not diaphoretic.  Eyes:     Conjunctiva/sclera: Conjunctivae normal.  Cardiovascular:     Rate and Rhythm: Normal rate and regular  rhythm.     Heart sounds: Normal heart sounds. No murmur.  Pulmonary:     Effort: Pulmonary effort is normal. No respiratory distress.     Breath sounds: Normal breath sounds. No wheezing.  Musculoskeletal: Normal range of motion.        General: No tenderness.     Left knee: She exhibits swelling. She exhibits normal range of motion, no erythema and no LCL laxity. No medial joint line and no lateral joint line tenderness noted.  Skin:    General: Skin is warm and dry.     Findings: No rash.  Neurological:     Mental Status: She is alert and oriented to person, place, and time.     Coordination: Coordination normal.  Psychiatric:        Behavior: Behavior normal.         Assessment & Plan:   Problem List Items Addressed This Visit      Digestive   Fatty liver   Relevant Orders   CMP14+EGFR (Completed)   Lipid panel (Completed)     Other   Anxiety   Depression - Primary   Vitamin D deficiency   Relevant Orders   VITAMIN D 25 Hydroxy (Vit-D Deficiency, Fractures) (Completed)   Obesity (BMI 30.0-34.9)    Other Visit Diagnoses    Baker's cyst of knee, left          Continue current medication for depression and anxiety, will do refills for the patient.  For her knee and Baker's cyst we will continue to monitor and observe, if worsens may do drainage or injection.  Patient does not want currently will use anti-inflammatories over-the-counter for now. Follow up plan: Return in about 6 months (around 02/07/2019), or if symptoms worsen or fail to improve, for Recheck depression anxiety and fatty liver.  Counseling provided for all of the vaccine components Orders Placed This Encounter  Procedures  . CMP14+EGFR  . VITAMIN D 25 Hydroxy (Vit-D Deficiency, Fractures)  . Lipid panel    Caryl Pina, MD [facility] 08/09/2018, 8:42 AM

## 2018-08-10 LAB — VITAMIN D 25 HYDROXY (VIT D DEFICIENCY, FRACTURES): Vit D, 25-Hydroxy: 49.6 ng/mL (ref 30.0–100.0)

## 2018-08-10 LAB — CMP14+EGFR
ALT: 22 IU/L (ref 0–32)
AST: 23 IU/L (ref 0–40)
Albumin/Globulin Ratio: 1.7 (ref 1.2–2.2)
Albumin: 4.3 g/dL (ref 3.8–4.8)
Alkaline Phosphatase: 90 IU/L (ref 39–117)
BUN/Creatinine Ratio: 12 (ref 12–28)
BUN: 10 mg/dL (ref 8–27)
Bilirubin Total: 0.5 mg/dL (ref 0.0–1.2)
CO2: 23 mmol/L (ref 20–29)
Calcium: 9.5 mg/dL (ref 8.7–10.3)
Chloride: 101 mmol/L (ref 96–106)
Creatinine, Ser: 0.81 mg/dL (ref 0.57–1.00)
GFR calc Af Amer: 91 mL/min/{1.73_m2} (ref >59)
GFR calc non Af Amer: 79 mL/min/{1.73_m2} (ref >59)
GLUCOSE: 88 mg/dL (ref 65–99)
Globulin, Total: 2.6 g/dL (ref 1.5–4.5)
Potassium: 4.3 mmol/L (ref 3.5–5.2)
Sodium: 142 mmol/L (ref 134–144)
Total Protein: 6.9 g/dL (ref 6.0–8.5)

## 2018-08-10 LAB — LIPID PANEL
Chol/HDL Ratio: 2.7 ratio (ref 0.0–4.4)
Cholesterol, Total: 137 mg/dL (ref 100–199)
HDL: 51 mg/dL (ref >39)
LDL Calculated: 70 mg/dL (ref 0–99)
Triglycerides: 80 mg/dL (ref 0–149)
VLDL Cholesterol Cal: 16 mg/dL (ref 5–40)

## 2018-11-04 DIAGNOSIS — Z03818 Encounter for observation for suspected exposure to other biological agents ruled out: Secondary | ICD-10-CM | POA: Diagnosis not present

## 2019-01-11 ENCOUNTER — Other Ambulatory Visit: Payer: Self-pay | Admitting: Family

## 2019-01-11 DIAGNOSIS — J069 Acute upper respiratory infection, unspecified: Secondary | ICD-10-CM

## 2019-02-08 ENCOUNTER — Other Ambulatory Visit: Payer: Self-pay

## 2019-02-09 ENCOUNTER — Ambulatory Visit (INDEPENDENT_AMBULATORY_CARE_PROVIDER_SITE_OTHER): Payer: BC Managed Care – PPO | Admitting: Family Medicine

## 2019-02-09 ENCOUNTER — Encounter: Payer: Self-pay | Admitting: Family Medicine

## 2019-02-09 VITALS — BP 142/79 | HR 55 | Temp 98.0°F | Ht 64.0 in | Wt 171.0 lb

## 2019-02-09 DIAGNOSIS — Z1159 Encounter for screening for other viral diseases: Secondary | ICD-10-CM

## 2019-02-09 DIAGNOSIS — K760 Fatty (change of) liver, not elsewhere classified: Secondary | ICD-10-CM

## 2019-02-09 DIAGNOSIS — F419 Anxiety disorder, unspecified: Secondary | ICD-10-CM

## 2019-02-09 DIAGNOSIS — E669 Obesity, unspecified: Secondary | ICD-10-CM

## 2019-02-09 MED ORDER — ALPRAZOLAM 0.25 MG PO TABS: 0.5000 mg | ORAL_TABLET | Freq: Every evening | ORAL | 1 refills | Status: DC | PRN

## 2019-02-09 NOTE — Progress Notes (Signed)
BP (!) 142/79   Pulse (!) 55   Temp 98 F (36.7 C) (Temporal)   Ht _0  (1.626 m)   Wt 171 lb (77.6 kg)   LMP 11/26/2010   BMI 29.35 kg/m    Subjective:   Patient ID: Kayla Romero, female    DOB: August 29, 1955, 63 y.o.   MRN: [id number]  HPI: Kayla Romero is a 63 y.o. female presenting on 02/09/2019 for Depression (6 month follow up) and bakers cyst (behind left knee- patient states it has been there x 1 year but has started being painful )   HPI Anxiety Patient is coming in for anxiety and depression recheck.  She currently takes Wellbutrin and then uses the alprazolam as needed.  She does not use it every day and spaces it out how often she needs and lasts for at least a few months for 1 prescription. Current rx-Xanax 0.25 mg twice daily as needed # meds rx-60 Effectiveness of current meds-working very well and is very happy even with the coronavirus about how things are gone. Adverse reactions from meds-none  Pill count performed-No Last drug screen - n/a ( high risk q53m moderate risk q619mlow risk yearly ) Urine drug screen today- Yes [place]  If yes were their any concerning findings? -None  No flowsheet data found.  Controlled substance contract signed on: 02/09/2019  Patient has a Baker's cyst behind her left knee that is been there for over a year and she feels like it will swell up on her some time and has been bothering her more recently over the past couple months.  She is not currently taking anything for it over-the-counter or prescription.  She says she will get the swelling up and down sometimes and it has been there longer this time.  She says the pain is not severe but she just wanted to have [place] aware of it.  Relevant past medical, surgical, family and social history reviewed and updated as indicated. Interim medical history since our last visit reviewed. Allergies and medications reviewed and updated.  Review of Systems   Constitutional: Negative for chills and fever.  Eyes: Negative for visual disturbance.  Respiratory: Negative for chest tightness and shortness of breath.   Cardiovascular: Negative for chest pain and leg swelling.  Musculoskeletal: Positive for arthralgias and joint swelling. Negative for back pain and gait problem.  Skin: Negative for rash.  Neurological: Negative for light-headedness and headaches.  Psychiatric/Behavioral: Negative for agitation and behavioral problems.  All other systems reviewed and are negative.   Per HPI unless specifically indicated above   Allergies as of 02/09/2019   No Known Allergies     Medication List       Accurate as of February 09, 2019 11:59 PM. If you have any questions, ask your nurse or doctor.        ALPRAZolam 0.25 MG tablet Commonly known as: XANAX Take 2 tablets (0.5 mg total) by mouth at bedtime as needed (panic attack).   buPROPion 150 MG 24 hr tablet Commonly known as: WELLBUTRIN XL Take 1 tablet (150 mg total) by mouth every morning.   fluticasone 50 MCG/ACT nasal spray Commonly known as: FLONASE SPRAY 2 SPRAYS INTO EACH NOSTRIL EVERY DAY   SYSTANE ULTRA OP Apply to eye.   UNABLE TO FIND [facility]        Objective:   BP (!) 142/79   Pulse (!) 55   Temp 98 F (36.7 C) (  Temporal)   Ht _0  (1.626 m)   Wt 171 lb (77.6 kg)   LMP 11/26/2010   BMI 29.35 kg/m   Wt Readings from Last 3 Encounters:  02/09/19 171 lb (77.6 kg)  08/09/18 169 lb 12.8 oz (77 kg)  06/28/18 177 lb 12.8 oz (80.6 kg)    Physical Exam Vitals signs and nursing note reviewed.  Constitutional:      General: She is not in acute distress.    Appearance: She is well-developed. She is not diaphoretic.  Eyes:     Conjunctiva/sclera: Conjunctivae normal.     Pupils: Pupils are equal, round, and reactive to light.  Cardiovascular:     Rate and Rhythm: Normal rate and regular rhythm.     Heart sounds: Normal heart sounds. No murmur.  Pulmonary:      Effort: Pulmonary effort is normal. No respiratory distress.     Breath sounds: Normal breath sounds. No wheezing.  Musculoskeletal: Normal range of motion.        General: No tenderness.  Skin:    General: Skin is warm and dry.     Findings: No rash.  Neurological:     Mental Status: She is alert and oriented to person, place, and time.     Coordination: Coordination normal.  Psychiatric:        Mood and Affect: Mood is anxious.        Behavior: Behavior normal.       Assessment & Plan:   Problem List Items Addressed This Visit      Digestive   Fatty liver - Primary   Relevant Orders   CBC with Differential/Platelet (Completed)   CMP14+EGFR (Completed)     Other   Anxiety   Relevant Medications   ALPRAZolam (XANAX) 0.25 MG tablet   Other Relevant Orders   ToxASSURE Select 13 (MW), Urine (Completed)   Obesity (BMI 30.0-34.9)    Other Visit Diagnoses    Need for hepatitis C screening test       Relevant Orders   Hepatitis C antibody (Completed)      Patient rarely uses alprazolam and we will refill it.  Her last refill was more than 6 months ago.  We discussed the Baker's cyst in her knee and she did not want to do any fluid withdrawal or injection today and she will try oral anti-inflammatories for couple weeks and then give [place] a call back. Follow up plan: Return in about 6 months (around 08/12/2019), or if symptoms worsen or fail to improve, for Anxiety and depression recheck.  Counseling provided for all of the vaccine components Orders Placed This Encounter  Procedures  . CBC with Differential/Platelet  . CMP14+EGFR  . ToxASSURE Select 13 (MW), Urine  . Hepatitis C antibody    Caryl Pina, MD [facility] 02/14/2019, 9:19 PM

## 2019-02-10 ENCOUNTER — Other Ambulatory Visit: Payer: BC Managed Care – PPO

## 2019-02-10 ENCOUNTER — Other Ambulatory Visit: Payer: Self-pay

## 2019-02-10 DIAGNOSIS — K760 Fatty (change of) liver, not elsewhere classified: Secondary | ICD-10-CM | POA: Diagnosis not present

## 2019-02-10 DIAGNOSIS — Z1159 Encounter for screening for other viral diseases: Secondary | ICD-10-CM

## 2019-02-11 LAB — CBC WITH DIFFERENTIAL/PLATELET
Basophils Absolute: 0.1 10*3/uL (ref 0.0–0.2)
Basos: 1 %
EOS (ABSOLUTE): 0.1 10*3/uL (ref 0.0–0.4)
Eos: 2 %
Hematocrit: 38 % (ref 34.0–46.6)
Hemoglobin: 12.7 g/dL (ref 11.1–15.9)
Immature Grans (Abs): 0 10*3/uL (ref 0.0–0.1)
Immature Granulocytes: 0 %
Lymphocytes Absolute: 2.1 10*3/uL (ref 0.7–3.1)
Lymphs: 39 %
MCH: 30.3 pg (ref 26.6–33.0)
MCHC: 33.4 g/dL (ref 31.5–35.7)
MCV: 91 fL (ref 79–97)
Monocytes Absolute: 0.4 10*3/uL (ref 0.1–0.9)
Monocytes: 7 %
Neutrophils Absolute: 2.8 10*3/uL (ref 1.4–7.0)
Neutrophils: 51 %
Platelets: 285 10*3/uL (ref 150–450)
RBC: 4.19 x10E6/uL (ref 3.77–5.28)
RDW: 12 % (ref 11.7–15.4)
WBC: 5.5 10*3/uL (ref 3.4–10.8)

## 2019-02-11 LAB — CMP14+EGFR
ALT: 24 IU/L (ref 0–32)
AST: 23 IU/L (ref 0–40)
Albumin/Globulin Ratio: 1.8 (ref 1.2–2.2)
Albumin: 4.1 g/dL (ref 3.8–4.8)
Alkaline Phosphatase: 104 IU/L (ref 39–117)
BUN/Creatinine Ratio: 12 (ref 12–28)
BUN: 10 mg/dL (ref 8–27)
Bilirubin Total: 0.5 mg/dL (ref 0.0–1.2)
CO2: 24 mmol/L (ref 20–29)
Calcium: 9.3 mg/dL (ref 8.7–10.3)
Chloride: 104 mmol/L (ref 96–106)
Creatinine, Ser: 0.84 mg/dL (ref 0.57–1.00)
GFR calc Af Amer: 86 mL/min/{1.73_m2} (ref >59)
GFR calc non Af Amer: 75 mL/min/{1.73_m2} (ref >59)
Globulin, Total: 2.3 g/dL (ref 1.5–4.5)
Glucose: 85 mg/dL (ref 65–99)
Potassium: 4.3 mmol/L (ref 3.5–5.2)
Sodium: 142 mmol/L (ref 134–144)
Total Protein: 6.4 g/dL (ref 6.0–8.5)

## 2019-02-11 LAB — HEPATITIS C ANTIBODY: Hep C Virus Ab: 0.1 s/co ratio (ref 0.0–0.9)

## 2019-02-12 LAB — TOXASSURE SELECT 13 (MW), URINE

## 2019-02-15 DIAGNOSIS — H35042 Retinal micro-aneurysms, unspecified, left eye: Secondary | ICD-10-CM | POA: Diagnosis not present

## 2019-02-15 DIAGNOSIS — H35372 Puckering of macula, left eye: Secondary | ICD-10-CM | POA: Diagnosis not present

## 2019-02-15 DIAGNOSIS — H353132 Nonexudative age-related macular degeneration, bilateral, intermediate dry stage: Secondary | ICD-10-CM | POA: Diagnosis not present

## 2019-02-15 DIAGNOSIS — H35032 Hypertensive retinopathy, left eye: Secondary | ICD-10-CM | POA: Diagnosis not present

## 2019-02-24 DIAGNOSIS — Z1211 Encounter for screening for malignant neoplasm of colon: Secondary | ICD-10-CM | POA: Diagnosis not present

## 2019-02-24 DIAGNOSIS — E669 Obesity, unspecified: Secondary | ICD-10-CM | POA: Diagnosis not present

## 2019-03-09 ENCOUNTER — Encounter: Payer: Self-pay | Admitting: Nurse Practitioner

## 2019-03-09 ENCOUNTER — Other Ambulatory Visit: Payer: Self-pay

## 2019-03-09 ENCOUNTER — Ambulatory Visit: Payer: BC Managed Care – PPO | Admitting: Nurse Practitioner

## 2019-03-09 VITALS — BP 137/79 | HR 57 | Temp 96.9°F | Resp 22 | Ht 64.0 in | Wt 171.0 lb

## 2019-03-09 DIAGNOSIS — R002 Palpitations: Secondary | ICD-10-CM

## 2019-03-09 NOTE — Progress Notes (Signed)
   Subjective:    Patient ID: Kayla Romero, female    DOB: 14-Mar-1956, 63 y.o.   MRN: [id number]   Chief Complaint: heart fluttering   HPI Patient comes in today c/o heart feeling like it is skipping. She describes it as a fluttering feeling. Occurs daily only lasting a few seconds, but occurs several times a day. She checked her pulse this morning during and episode and it felt like she was skipping beats. During episodes she feels like she needs to cough. She said it has been  going on for 3 weeks or more. But is occurring more frequently. She has stopped all caffeine and it has made no difference in frequency of occurrence.   Review of Systems  Constitutional: Negative for activity change and appetite change.  HENT: Negative.   Eyes: Negative for pain.  Respiratory: Negative for shortness of breath.   Cardiovascular: Positive for palpitations. Negative for chest pain and leg swelling.  Gastrointestinal: Negative for abdominal pain.  Endocrine: Negative for polydipsia.  Genitourinary: Negative.   Skin: Negative for rash.  Neurological: Negative for dizziness, weakness and headaches.  Hematological: Does not bruise/bleed easily.  Psychiatric/Behavioral: Negative.   All other systems reviewed and are negative.      Objective:   Physical Exam Vitals signs and nursing note reviewed.  Constitutional:      Appearance: Normal appearance.  Cardiovascular:     Rate and Rhythm: Normal rate.     Heart sounds: Normal heart sounds.  Pulmonary:     Breath sounds: Normal breath sounds.  Skin:    General: Skin is warm and dry.  Neurological:     General: No focal deficit present.     Mental Status: She is alert and oriented to person, place, and time.  Psychiatric:        Mood and Affect: Mood normal.        Behavior: Behavior normal.    BP 137/79   Pulse (!) 57   Temp (!) 96.9 F (36.1 C) (Oral)   Resp (!) 22   Ht 5\' 4"  (1.626 m)   Wt 171 lb (77.6 kg)   LMP 11/26/2010    SpO2 99%   BMI 29.35 kg/m   EKG- sinus Verne Grain, FNP       Assessment & Plan:  Kayla Romero in today with chief complaint of heart fluttering   1. Heart palpitations 24 hour holter monitor- will call once results are back Continue to avoid caffeine - EKG 12-Lead  Mary-Margaret Hassell Done, FNP

## 2019-03-09 NOTE — Patient Instructions (Signed)
Palpitations Palpitations are feelings that your heartbeat is not normal. Your heartbeat may feel like it is:  Uneven.  Faster than normal.  Fluttering.  Skipping a beat. This is usually not a serious problem. In some cases, you may need tests to rule out any serious problems. Follow these instructions at home: Pay attention to any changes in your condition. Take these actions to help manage your symptoms: Eating and drinking  Avoid: ? Coffee, tea, soft drinks, and energy drinks. ? Chocolate. ? Alcohol. ? Diet pills. Lifestyle   Try to lower your stress. These things can help you relax: ? Yoga. ? Deep breathing and meditation. ? Exercise. ? Using words and images to create positive thoughts (guided imagery). ? Using your mind to control things in your body (biofeedback).  Do not use drugs.  Get plenty of rest and sleep. Keep a regular bed time. General instructions   Take over-the-counter and prescription medicines only as told by your doctor.  Do not use any products that contain nicotine or tobacco, such as cigarettes and e-cigarettes. If you need help quitting, ask your doctor.  Keep all follow-up visits as told by your doctor. This is important. You may need more tests if palpitations do not go away or get worse. Contact a doctor if:  Your symptoms last more than 24 hours.  Your symptoms occur more often. Get help right away if you:  Have chest pain.  Feel short of breath.  Have a very bad headache.  Feel dizzy.  Pass out (faint). Summary  Palpitations are feelings that your heartbeat is uneven or faster than normal. It may feel like your heart is fluttering or skipping a beat.  Avoid food and drinks that may cause palpitations. These include caffeine, chocolate, and alcohol.  Try to lower your stress. Do not smoke or use drugs.  Get help right away if you faint or have chest pain, shortness of breath, a severe headache, or dizziness. This  information is not intended to replace advice given to you by your health care provider. Make sure you discuss any questions you have with your health care provider. Document Released: 03/25/2008 Document Revised: 07/29/2017 Document Reviewed: 07/29/2017 Elsevier Patient Education  2020 Elsevier Inc.  

## 2019-03-10 DIAGNOSIS — R002 Palpitations: Secondary | ICD-10-CM | POA: Diagnosis not present

## 2019-03-15 ENCOUNTER — Other Ambulatory Visit: Payer: Self-pay

## 2019-03-15 ENCOUNTER — Telehealth: Payer: Self-pay | Admitting: Family Medicine

## 2019-03-15 DIAGNOSIS — R002 Palpitations: Secondary | ICD-10-CM

## 2019-03-18 ENCOUNTER — Telehealth: Payer: Self-pay

## 2019-03-18 NOTE — Telephone Encounter (Signed)
Patient called and is worried because palpitations are happening more frequently and feel stronger than they were before.  She would like to know what she should do and if you think she could get in to see the cardiologist quicker.  Her referral to the cardiologist is in there but has not been done yet.

## 2019-03-20 DIAGNOSIS — R002 Palpitations: Secondary | ICD-10-CM | POA: Diagnosis not present

## 2019-03-20 DIAGNOSIS — Z6829 Body mass index (BMI) 29.0-29.9, adult: Secondary | ICD-10-CM | POA: Diagnosis not present

## 2019-03-20 DIAGNOSIS — I493 Ventricular premature depolarization: Secondary | ICD-10-CM | POA: Diagnosis not present

## 2019-03-22 NOTE — Telephone Encounter (Signed)
Please check on referral to cardiology- needs to change to urgent since symptoms are worsening

## 2019-03-25 ENCOUNTER — Encounter: Payer: Self-pay | Admitting: Cardiovascular Disease

## 2019-03-25 ENCOUNTER — Ambulatory Visit: Payer: BC Managed Care – PPO | Admitting: Cardiovascular Disease

## 2019-03-25 ENCOUNTER — Other Ambulatory Visit: Payer: Self-pay

## 2019-03-25 DIAGNOSIS — R002 Palpitations: Secondary | ICD-10-CM | POA: Diagnosis not present

## 2019-03-25 NOTE — Assessment & Plan Note (Signed)
Kayla Romero was referred to me by Mary-Margaret Hassell Done, FNP for evaluation of palpitations.  She has had them for approximately a month.  They occur daily throughout the day more so in the morning than in the evening.  She is cut back her caffeine to decaf.  There are no other instigating factors.  She did have a 24-hour Holter monitor that apparently was unrevealing and a recent twelve-lead EKG performed at an urgent care center that showed occasional PVCs.  I am going to get a 2-week ZIO patch and 2D echocardiogram to further evaluate.  Her TSH was at the upper limit of normal.

## 2019-03-25 NOTE — Patient Instructions (Signed)
Medication Instructions:  Your physician recommends that you continue on your current medications as directed. Please refer to the Current Medication list given to you today.  If you need a refill on your cardiac medications before your next appointment, please call your pharmacy.   Lab work: none If you have labs (blood work) drawn today and your tests are completely normal, you will receive your results only by: Marland Kitchen MyChart Message (if you have MyChart) OR . A paper copy in the mail If you have any lab test that is abnormal or we need to change your treatment, we will call you to review the results.  Testing/Procedures: Your physician has requested that you have an echocardiogram. Echocardiography is a painless test that uses sound waves to create images of your heart. It provides your doctor with information about the size and shape of your heart and how well your heart's chambers and valves are working. This procedure takes approximately one hour. There are no restrictions for this procedure. LOCATION: [facility]: [place], [place], [place] [address] TO BE SCHEDULED   Your physician has recommended that you wear a 14 DAY ZIO-PATCH monitor. The Zio patch cardiac monitor continuously records heart rhythm data for up to 14 days, this is for patients being evaluated for multiple types heart rhythms. For the first 24 hours post application, please avoid getting the Zio monitor wet in the shower or by excessive sweating during exercise. After that, feel free to carry on with regular activities. Keep soaps and lotions away from the ZIO XT Patch.  This will be placed at our [facility] location - [address] [place].  TO B SCHEDULED        Follow-Up: At [facility], you and your health needs are our priority.  As part of our continuing mission to provide you with exceptional heart care, we have created designated Provider Care Teams.  These Care  Teams include your primary Cardiologist (physician) and Advanced Practice Providers (APPs -  Physician Assistants and Nurse Practitioners) who all work together to provide you with the care you need, when you need it. You will need a follow up appointment in 4-6 weeks with Dr. Quay Burow.

## 2019-03-25 NOTE — Progress Notes (Signed)
03/25/2019 Kayla Romero   14-May-1956  [id number]  Primary Physician Dettinger, Fransisca Kaufmann, MD Primary Cardiologist: Lorretta Harp MD FACP, [place], [place], [place]  HPI:  Kayla Romero is a 63 y.o. mild to moderately overweight married Caucasian female mother of 11, grandmother of 7 grandchildren who takes care of her grandchildren at home and has not worked for the last 5 years.  She was referred by Chevis Pretty, FNP for evaluation of palpitations.  Risk factors include 40 to 60 pack years of tobacco abuse having quit 3 years ago.  She does not have diabetes or hyperlipidemia.  There is no family history for heart disease.  She is never had a heart attack or stroke.  She denies chest pain or shortness of breath.  She had new onset palpitations approxi-1 month ago that occur on a daily basis more so in the morning in the evening.  She has reduced her caffeine intake voluntarily not drinking decaf coffee.  24-hour Holter monitor was unrevealing, lab work showed TSH at the upper limit of normal and recent EKG performed in urgent care center showed unifocal PVCs.   Current Meds  Medication Sig  . ALPRAZolam (XANAX) 0.25 MG tablet Take 2 tablets (0.5 mg total) by mouth at bedtime as needed (panic attack).  Marland Kitchen buPROPion (WELLBUTRIN XL) 150 MG 24 hr tablet Take 1 tablet (150 mg total) by mouth every morning.  . fluticasone (FLONASE) 50 MCG/ACT nasal spray SPRAY 2 SPRAYS INTO EACH NOSTRIL EVERY DAY  . Polyethyl Glycol-Propyl Glycol (SYSTANE ULTRA OP) Apply to eye.  Marland Kitchen UNABLE TO FIND [employer]     No Known Allergies  Social History   Socioeconomic History  . Marital status: Married    Spouse name: Not on file  . Number of children: Not on file  . Years of education: Not on file  . Highest education level: Not on file  Occupational History  . Not on file  Social Needs  . Financial resource strain: Not on file  . Food insecurity    Worry: Not on file    Inability: Not on file  .  Transportation needs    Medical: Not on file    Non-medical: Not on file  Tobacco Use  . Smoking status: Former Smoker    Types: Cigarettes    Quit date: 05/31/2015    Years since quitting: 3.8  . Smokeless tobacco: Never Used  . Tobacco comment: quit 2 weeks ago completely. had quit in 2012 but restarted  Substance and Sexual Activity  . Alcohol use: No  . Drug use: No  . Sexual activity: Not on file  Lifestyle  . Physical activity    Days per week: Not on file    Minutes per session: Not on file  . Stress: Not on file  Relationships  . Social Herbalist on phone: Not on file    Gets together: Not on file    Attends religious service: Not on file    Active member of club or organization: Not on file    Attends meetings of clubs or organizations: Not on file    Relationship status: Not on file  . Intimate partner violence    Fear of current or ex partner: Not on file    Emotionally abused: Not on file    Physically abused: Not on file    Forced sexual activity: Not on file  Other Topics Concern  . Not on  file  Social History Narrative  . Not on file     Review of Systems: General: negative for chills, fever, night sweats or weight changes.  Cardiovascular: negative for chest pain, dyspnea on exertion, edema, orthopnea, palpitations, paroxysmal nocturnal dyspnea or shortness of breath Dermatological: negative for rash Respiratory: negative for cough or wheezing Urologic: negative for hematuria Abdominal: negative for nausea, vomiting, diarrhea, bright red blood per rectum, melena, or hematemesis Neurologic: negative for visual changes, syncope, or dizziness All other systems reviewed and are otherwise negative except as noted above.    Blood pressure 139/85, pulse 66, height 5\' 4"  (1.626 m), weight 170 lb 9.6 oz (77.4 kg), last menstrual period 11/26/2010.  General appearance: alert and no distress Neck: no adenopathy, no carotid bruit, no JVD, supple,  symmetrical, trachea midline and thyroid not enlarged, symmetric, no tenderness/mass/nodules Lungs: clear to auscultation bilaterally Heart: regular rate and rhythm, S1, S2 normal, no murmur, click, rub or gallop Extremities: extremities normal, atraumatic, no cyanosis or edema Pulses: 2+ and symmetric Skin: Skin color, texture, turgor normal. No rashes or lesions Neurologic: Alert and oriented X 3, normal strength and tone. Normal symmetric reflexes. Normal coordination and gait  EKG not performed today  ASSESSMENT AND PLAN:   Palpitations Ms. Caplin was referred to me by Chevis Pretty, FNP for evaluation of palpitations.  She has had them for approximately a month.  They occur daily throughout the day more so in the morning than in the evening.  She is cut back her caffeine to decaf.  There are no other instigating factors.  She did have a 24-hour Holter monitor that apparently was unrevealing and a recent twelve-lead EKG performed at an urgent care center that showed occasional PVCs.  I am going to get a 2-week ZIO patch and 2D echocardiogram to further evaluate.  Her TSH was at the upper limit of normal.      Lorretta Harp MD [facility], [facility] 03/25/2019 11:02 AM

## 2019-03-28 ENCOUNTER — Other Ambulatory Visit: Payer: Self-pay

## 2019-03-28 ENCOUNTER — Telehealth: Payer: Self-pay | Admitting: *Deleted

## 2019-03-28 ENCOUNTER — Ambulatory Visit (HOSPITAL_COMMUNITY): Payer: BC Managed Care – PPO | Attending: Internal Medicine

## 2019-03-28 DIAGNOSIS — R002 Palpitations: Secondary | ICD-10-CM | POA: Diagnosis not present

## 2019-03-28 NOTE — Telephone Encounter (Signed)
14 day ZIO XT long term holter monitor to be mailed to the patients home.  Instructions reviewed briefly as they are included in the monitorl kit.

## 2019-04-03 ENCOUNTER — Other Ambulatory Visit: Payer: Self-pay | Admitting: Family

## 2019-04-03 ENCOUNTER — Ambulatory Visit (INDEPENDENT_AMBULATORY_CARE_PROVIDER_SITE_OTHER): Payer: BC Managed Care – PPO

## 2019-04-03 ENCOUNTER — Other Ambulatory Visit: Payer: Self-pay | Admitting: Family Medicine

## 2019-04-03 DIAGNOSIS — R002 Palpitations: Secondary | ICD-10-CM | POA: Diagnosis not present

## 2019-04-03 DIAGNOSIS — J069 Acute upper respiratory infection, unspecified: Secondary | ICD-10-CM

## 2019-04-05 ENCOUNTER — Telehealth: Payer: Self-pay | Admitting: *Deleted

## 2019-04-05 NOTE — Telephone Encounter (Signed)
   [facility] Pre-operative Risk Assessment    Request for surgical clearance:  1. What type of surgery is being performed? colonoscopy  2. When is this surgery scheduled? 05/30/2019  3. What type of clearance is required (medical clearance vs. Pharmacy clearance to hold med vs. Both)? medical  4. Are there any medications that need to be held prior to surgery and how long? none  5. Practice name and name of physician performing surgery[place] [facility] [place]  6. What is your office phone number [phone]   7.   What is your office fax number [phone]  8.   Anesthesia type (None, local, MAC, general) ? propofol   Fredia Beets 04/05/2019, 1:00 PM  _________________________________________________________________   (provider comments below)

## 2019-04-06 ENCOUNTER — Ambulatory Visit: Payer: BC Managed Care – PPO

## 2019-04-11 NOTE — Telephone Encounter (Signed)
Cleared for colonoscopy at low risk

## 2019-04-11 NOTE — Telephone Encounter (Signed)
Dr. Gwenlyn Found, recent 24 hour holter and echo were normal, a 2 week Zio Monitor is currently pending. But given the low risk nature of colonoscopy, would you be ok to complete the 2 week monitor after colonoscopy?

## 2019-04-12 NOTE — Telephone Encounter (Signed)
Left a detailed message for the patient about being cleared for upcoming procedure and if she has any questions to give our office a call.

## 2019-04-12 NOTE — Telephone Encounter (Signed)
   Primary Cardiologist: Quay Burow, MD  Chart reviewed as part of pre-operative protocol coverage. Given past medical history and time since last visit, based on ACC/AHA guidelines, NOOMI SCALA would be at acceptable risk for the planned procedure without further cardiovascular testing.   I will route this recommendation to the requesting party via Epic fax function and remove from pre-op pool.  Please call with questions.  Almyra Deforest, [place] 04/12/2019, 8:50 AM

## 2019-04-27 DIAGNOSIS — Z6831 Body mass index (BMI) 31.0-31.9, adult: Secondary | ICD-10-CM | POA: Diagnosis not present

## 2019-04-27 DIAGNOSIS — Z1231 Encounter for screening mammogram for malignant neoplasm of breast: Secondary | ICD-10-CM | POA: Diagnosis not present

## 2019-04-27 DIAGNOSIS — Z01419 Encounter for gynecological examination (general) (routine) without abnormal findings: Secondary | ICD-10-CM | POA: Diagnosis not present

## 2019-04-28 DIAGNOSIS — R002 Palpitations: Secondary | ICD-10-CM | POA: Diagnosis not present

## 2019-05-03 ENCOUNTER — Ambulatory Visit: Payer: BC Managed Care – PPO

## 2019-05-13 ENCOUNTER — Other Ambulatory Visit: Payer: Self-pay

## 2019-05-13 ENCOUNTER — Ambulatory Visit: Payer: BC Managed Care – PPO | Admitting: Cardiovascular Disease

## 2019-05-13 ENCOUNTER — Encounter: Payer: Self-pay | Admitting: Cardiovascular Disease

## 2019-05-13 DIAGNOSIS — R002 Palpitations: Secondary | ICD-10-CM | POA: Diagnosis not present

## 2019-05-13 MED ORDER — METOPROLOL SUCCINATE ER 25 MG PO TB24: 25.0000 mg | ORAL_TABLET | Freq: Every day | ORAL | 3 refills | Status: DC

## 2019-05-13 NOTE — Assessment & Plan Note (Signed)
Kayla Romero was initially referred to me for symptomatic palpitations.  She has cut back her caffeine intake and now drinks decaf coffee.  I obtain a 2D echocardiogram on her which was entirely normal and an event monitor that showed PVCs occasionally in a trigeminal pattern.  I am going to begin her on Toprol-XL 25 mg a day empirically I will see her back in 3 months for follow-up.

## 2019-05-13 NOTE — Progress Notes (Signed)
05/13/2019 Kayla Romero   April 24, 1956  [id number]  Primary Physician Dettinger, Fransisca Kaufmann, MD Primary Cardiologist: Lorretta Harp MD FACP, [place], [place], [place]  HPI:  Kayla Romero is a 63 y.o.  mild to moderately overweight married Caucasian female mother of 53, grandmother of 7 grandchildren who takes care of her grandchildren at home and has not worked for the last 5 years.  She was referred by Chevis Pretty, FNP for evaluation of palpitations.  I last saw her in the office 03/25/2019.   Her risk factors include 40 to 60 pack years of tobacco abuse having quit 3 years ago.  She does not have diabetes or hyperlipidemia.  There is no family history for heart disease.  She is never had a heart attack or stroke.  She denies chest pain or shortness of breath.  She had new onset palpitations approxi-1 month ago that occur on a daily basis more so in the morning in the evening.  She has reduced her caffeine intake voluntarily not drinking decaf coffee.  24-hour Holter monitor was unrevealing, lab work showed TSH at the upper limit of normal and recent EKG performed in urgent care center showed unifocal PVCs.  A 2D echocardiogram performed 03/20/2019 was normal and an event monitor resulting on 04/03/2019 showed frequent PVCs occasionally in a trigeminal pattern.  She has palpitations on a daily basis.   Current Meds  Medication Sig  . ALPRAZolam (XANAX) 0.25 MG tablet Take 2 tablets (0.5 mg total) by mouth at bedtime as needed (panic attack).  Marland Kitchen buPROPion (WELLBUTRIN XL) 150 MG 24 hr tablet TAKE 1 TABLET BY MOUTH EVERY DAY IN THE MORNING  . fluticasone (FLONASE) 50 MCG/ACT nasal spray SPRAY 2 SPRAYS INTO EACH NOSTRIL EVERY DAY  . Polyethyl Glycol-Propyl Glycol (SYSTANE ULTRA OP) Apply to eye.  Marland Kitchen UNABLE TO FIND [employer]     No Known Allergies  Social History   Socioeconomic History  . Marital status: Married    Spouse name: Not on file  . Number of children: Not on file  .  Years of education: Not on file  . Highest education level: Not on file  Occupational History  . Not on file  Social Needs  . Financial resource strain: Not on file  . Food insecurity    Worry: Not on file    Inability: Not on file  . Transportation needs    Medical: Not on file    Non-medical: Not on file  Tobacco Use  . Smoking status: Former Smoker    Types: Cigarettes    Quit date: 05/31/2015    Years since quitting: 3.9  . Smokeless tobacco: Never Used  . Tobacco comment: quit 2 weeks ago completely. had quit in 2012 but restarted  Substance and Sexual Activity  . Alcohol use: No  . Drug use: No  . Sexual activity: Not on file  Lifestyle  . Physical activity    Days per week: Not on file    Minutes per session: Not on file  . Stress: Not on file  Relationships  . Social Herbalist on phone: Not on file    Gets together: Not on file    Attends religious service: Not on file    Active member of club or organization: Not on file    Attends meetings of clubs or organizations: Not on file    Relationship status: Not on file  . Intimate partner violence  Fear of current or ex partner: Not on file    Emotionally abused: Not on file    Physically abused: Not on file    Forced sexual activity: Not on file  Other Topics Concern  . Not on file  Social History Narrative  . Not on file     Review of Systems: General: negative for chills, fever, night sweats or weight changes.  Cardiovascular: negative for chest pain, dyspnea on exertion, edema, orthopnea, palpitations, paroxysmal nocturnal dyspnea or shortness of breath Dermatological: negative for rash Respiratory: negative for cough or wheezing Urologic: negative for hematuria Abdominal: negative for nausea, vomiting, diarrhea, bright red blood per rectum, melena, or hematemesis Neurologic: negative for visual changes, syncope, or dizziness All other systems reviewed and are otherwise negative except as  noted above.    Blood pressure 112/80, pulse 74, height 5\' 4"  (1.626 m), weight 177 lb 6.4 oz (80.5 kg), last menstrual period 11/26/2010, SpO2 98 %.  General appearance: alert and no distress Neck: no adenopathy, no carotid bruit, no JVD, supple, symmetrical, trachea midline and thyroid not enlarged, symmetric, no tenderness/mass/nodules Lungs: clear to auscultation bilaterally Heart: regular rate and rhythm, S1, S2 normal, no murmur, click, rub or gallop Extremities: extremities normal, atraumatic, no cyanosis or edema Pulses: 2+ and symmetric Skin: Skin color, texture, turgor normal. No rashes or lesions Neurologic: Alert and oriented X 3, normal strength and tone. Normal symmetric reflexes. Normal coordination and gait  EKG not performed today  ASSESSMENT AND PLAN:   Palpitations [name] was initially referred to me for symptomatic palpitations.  She has cut back her caffeine intake and now drinks decaf coffee.  I obtain a 2D echocardiogram on her which was entirely normal and an event monitor that showed PVCs occasionally in a trigeminal pattern.  I am going to begin her on Toprol-XL 25 mg a day empirically I will see her back in 3 months for follow-up.      Lorretta Harp MD FACP,FACC,FAHA, [facility] 05/13/2019 9:52 AM

## 2019-05-13 NOTE — Patient Instructions (Signed)
Medication Instructions:  Start taking 25mg  Toprol XL daily.   If you need a refill on your cardiac medications before your next appointment, please call your pharmacy.   Lab work: NONE  Testing/Procedures: NONE  Follow-Up: At [facility], you and your health needs are our priority.  As part of our continuing mission to provide you with exceptional heart care, we have created designated Provider Care Teams.  These Care Teams include your primary Cardiologist (physician) and Advanced Practice Providers (APPs -  Physician Assistants and Nurse Practitioners) who all work together to provide you with the care you need, when you need it. You may see Quay Burow, MD or one of the following Advanced Practice Providers on your designated Care Team:    Kerin Ransom, PA-C  [place], [place]  Coletta Memos, [place]  Your physician wants you to follow-up in: 3 months

## 2019-05-16 DIAGNOSIS — E612 Magnesium deficiency: Secondary | ICD-10-CM | POA: Diagnosis not present

## 2019-05-16 DIAGNOSIS — R946 Abnormal results of thyroid function studies: Secondary | ICD-10-CM | POA: Diagnosis not present

## 2019-05-16 DIAGNOSIS — E559 Vitamin D deficiency, unspecified: Secondary | ICD-10-CM | POA: Diagnosis not present

## 2019-05-18 DIAGNOSIS — R946 Abnormal results of thyroid function studies: Secondary | ICD-10-CM | POA: Diagnosis not present

## 2019-05-30 ENCOUNTER — Encounter: Payer: Self-pay | Admitting: Family Medicine

## 2019-05-30 DIAGNOSIS — K635 Polyp of colon: Secondary | ICD-10-CM | POA: Diagnosis not present

## 2019-05-30 DIAGNOSIS — D125 Benign neoplasm of sigmoid colon: Secondary | ICD-10-CM | POA: Diagnosis not present

## 2019-05-30 DIAGNOSIS — D124 Benign neoplasm of descending colon: Secondary | ICD-10-CM | POA: Diagnosis not present

## 2019-05-30 DIAGNOSIS — Z1211 Encounter for screening for malignant neoplasm of colon: Secondary | ICD-10-CM | POA: Diagnosis not present

## 2019-06-02 ENCOUNTER — Telehealth: Payer: Self-pay | Admitting: Family Medicine

## 2019-06-02 NOTE — Telephone Encounter (Signed)
Pt scheduled with Dr D 08/26/19 at 10:55, offered earlier virtual appt but pt declined.

## 2019-06-16 ENCOUNTER — Other Ambulatory Visit: Payer: Self-pay | Admitting: Family Medicine

## 2019-07-01 DIAGNOSIS — I499 Cardiac arrhythmia, unspecified: Secondary | ICD-10-CM

## 2019-07-01 HISTORY — DX: Cardiac arrhythmia, unspecified: I49.9

## 2019-07-29 ENCOUNTER — Other Ambulatory Visit: Payer: Self-pay | Admitting: Family Medicine

## 2019-07-29 DIAGNOSIS — F419 Anxiety disorder, unspecified: Secondary | ICD-10-CM

## 2019-08-16 ENCOUNTER — Other Ambulatory Visit: Payer: Self-pay

## 2019-08-16 ENCOUNTER — Encounter: Payer: Self-pay | Admitting: Cardiovascular Disease

## 2019-08-16 ENCOUNTER — Ambulatory Visit: Payer: BC Managed Care – PPO | Admitting: Cardiovascular Disease

## 2019-08-16 DIAGNOSIS — R002 Palpitations: Secondary | ICD-10-CM

## 2019-08-16 NOTE — Assessment & Plan Note (Signed)
Ms. Kayla Romero returns today for follow-up of palpitations.  A 2D echo was entirely normal.  Event monitor did show PVCs.  I did prescribe to her beta-blocker her however in lieu of this she decided to make lifestyle changes including cutting out caffeine, getting more sleep and taking her anxiety medicine.  Her PVCs have become less frequent and noticeable.  Her TSH was 4.6 and a repeat in the 5 range making mild hyperthyroidism a potential contributor.  She is going to see her PCP to address this.

## 2019-08-16 NOTE — Patient Instructions (Signed)
Medication Instructions:  Your physician recommends that you continue on your current medications as directed. Please refer to the Current Medication list given to you today.  If you need a refill on your cardiac medications before your next appointment, please call your pharmacy.   Lab work: NONE  Testing/Procedures: NONE  Follow-Up: At [facility], you and your health needs are our priority.  As part of our continuing mission to provide you with exceptional heart care, we have created designated Provider Care Teams.  These Care Teams include your primary Cardiologist (physician) and Advanced Practice Providers (APPs -  Physician Assistants and Nurse Practitioners) who all work together to provide you with the care you need, when you need it. You may see Jonathan Berry, MD or one of the following Advanced Practice Providers on your designated Care Team:    Luke Kilroy, PA-C  Callie Goodrich, PA-C  Jesse Cleaver, FNP  Your physician wants you to follow-up in: 6 months with a physicians assistant and 1 year with Dr. Berry. You will receive a reminder letter in the mail two months in advance. If you don't receive a letter, please call our office to schedule the follow-up appointment.      

## 2019-08-16 NOTE — Progress Notes (Signed)
08/16/2019 Kayla Romero   12-Jun-1956  [id number]  Primary Physician Dettinger, Fransisca Kaufmann, MD Primary Cardiologist: Lorretta Harp MD FACP, [place], [place], [place]  HPI:  Kayla Romero is a 64 y.o.  mild to moderately overweight married Caucasian female mother of 26, grandmother of 7 grandchildren who takes care of her grandchildren at home and has not worked for the last 5 years. She was referred by Chevis Pretty, FNP for evaluation of palpitations.  I last saw her in the office 05/13/2019.  Her risk factors include 40 to 60 pack years of tobacco abuse having quit 3 years ago. She does not have diabetes or hyperlipidemia. There is no family history for heart disease. She is never had a heart attack or stroke. She denies chest pain or shortness of breath. She had new onset palpitations approxi-1 month ago that occur on a daily basis more so in the morning in the evening. She has reduced her caffeine intake voluntarily not drinking decaf coffee. 24-hour Holter monitor was unrevealing, lab work showed TSH at the upper limit of normal and recent EKG performed in urgent care center showed unifocal PVCs.  A 2D echocardiogram performed 03/20/2019 was normal and an event monitor resulting on 04/03/2019 showed frequent PVCs occasionally in a trigeminal pattern.  She has palpitations on a daily basis.  Since I saw her 3 months ago I had prescribed a low-dose beta-blocker which she did not take but in lieu of that cut out caffeine, got more sleep and has been taking her alprazolam on a as needed basis for anxiety.  The frequency and no stability of her palpitations have significantly improved.   Current Meds  Medication Sig  . ALPRAZolam (XANAX) 0.25 MG tablet Take 2 tablets (0.5 mg total) by mouth at bedtime as needed (panic attack).  Marland Kitchen buPROPion (WELLBUTRIN XL) 150 MG 24 hr tablet TAKE 1 TABLET BY MOUTH EVERY DAY IN THE MORNING  . fluticasone (FLONASE) 50 MCG/ACT nasal spray SPRAY 2  SPRAYS INTO EACH NOSTRIL EVERY DAY  . Polyethyl Glycol-Propyl Glycol (SYSTANE ULTRA OP) Apply to eye.  Marland Kitchen UNABLE TO FIND [employer]     No Known Allergies  Social History   Socioeconomic History  . Marital status: Married    Spouse name: Not on file  . Number of children: Not on file  . Years of education: Not on file  . Highest education level: Not on file  Occupational History  . Not on file  Tobacco Use  . Smoking status: Former Smoker    Types: Cigarettes    Quit date: 05/31/2015    Years since quitting: 4.2  . Smokeless tobacco: Never Used  . Tobacco comment: quit 2 weeks ago completely. had quit in 2012 but restarted  Substance and Sexual Activity  . Alcohol use: No  . Drug use: No  . Sexual activity: Not on file  Other Topics Concern  . Not on file  Social History Narrative  . Not on file   Social Determinants of Health   Financial Resource Strain:   . Difficulty of Paying Living Expenses: Not on file  Food Insecurity:   . Worried About Charity fundraiser in the Last Year: Not on file  . Ran Out of Food in the Last Year: Not on file  Transportation Needs:   . Lack of Transportation (Medical): Not on file  . Lack of Transportation (Non-Medical): Not on file  Physical Activity:   . Days of Exercise  per Week: Not on file  . Minutes of Exercise per Session: Not on file  Stress:   . Feeling of Stress : Not on file  Social Connections:   . Frequency of Communication with Friends and Family: Not on file  . Frequency of Social Gatherings with Friends and Family: Not on file  . Attends Religious Services: Not on file  . Active Member of Clubs or Organizations: Not on file  . Attends Archivist Meetings: Not on file  . Marital Status: Not on file  Intimate Partner Violence:   . Fear of Current or Ex-Partner: Not on file  . Emotionally Abused: Not on file  . Physically Abused: Not on file  . Sexually Abused: Not on file     Review of  Systems: General: negative for chills, fever, night sweats or weight changes.  Cardiovascular: negative for chest pain, dyspnea on exertion, edema, orthopnea, palpitations, paroxysmal nocturnal dyspnea or shortness of breath Dermatological: negative for rash Respiratory: negative for cough or wheezing Urologic: negative for hematuria Abdominal: negative for nausea, vomiting, diarrhea, bright red blood per rectum, melena, or hematemesis Neurologic: negative for visual changes, syncope, or dizziness All other systems reviewed and are otherwise negative except as noted above.    Blood pressure 132/90, pulse 82, height 5\' 4"  (1.626 m), weight 177 lb 3.2 oz (80.4 kg), last menstrual period 11/26/2010.  General appearance: alert and no distress Neck: no adenopathy, no carotid bruit, no JVD, supple, symmetrical, trachea midline and thyroid not enlarged, symmetric, no tenderness/mass/nodules Lungs: clear to auscultation bilaterally Heart: regular rate and rhythm, S1, S2 normal, no murmur, click, rub or gallop Extremities: extremities normal, atraumatic, no cyanosis or edema Pulses: 2+ and symmetric Skin: Skin color, texture, turgor normal. No rashes or lesions Neurologic: Alert and oriented X 3, normal strength and tone. Normal symmetric reflexes. Normal coordination and gait  EKG not performed today  ASSESSMENT AND PLAN:   Palpitations Ms. Henrene Pastor returns today for follow-up of palpitations.  A 2D echo was entirely normal.  Event monitor did show PVCs.  I did prescribe to her beta-blocker her however in lieu of this she decided to make lifestyle changes including cutting out caffeine, getting more sleep and taking her anxiety medicine.  Her PVCs have become less frequent and noticeable.  Her TSH was 4.6 and a repeat in the 5 range making mild hyperthyroidism a potential contributor.  She is going to see her PCP to address this.      Lorretta Harp MD FACP,FACC,FAHA, [facility] 08/16/2019 8:50  AM

## 2019-08-25 ENCOUNTER — Other Ambulatory Visit: Payer: Self-pay

## 2019-08-26 ENCOUNTER — Ambulatory Visit: Payer: BC Managed Care – PPO | Admitting: Family Medicine

## 2019-08-26 ENCOUNTER — Encounter: Payer: Self-pay | Admitting: Family Medicine

## 2019-08-26 VITALS — BP 139/84 | HR 71 | Temp 97.8°F | Ht 64.0 in | Wt 177.0 lb

## 2019-08-26 DIAGNOSIS — Z13220 Encounter for screening for lipoid disorders: Secondary | ICD-10-CM | POA: Diagnosis not present

## 2019-08-26 DIAGNOSIS — R7989 Other specified abnormal findings of blood chemistry: Secondary | ICD-10-CM

## 2019-08-26 DIAGNOSIS — F419 Anxiety disorder, unspecified: Secondary | ICD-10-CM

## 2019-08-26 DIAGNOSIS — Z131 Encounter for screening for diabetes mellitus: Secondary | ICD-10-CM

## 2019-08-26 MED ORDER — BUPROPION HCL ER (XL) 150 MG PO TB24: ORAL_TABLET | ORAL | 1 refills | Status: DC

## 2019-08-26 MED ORDER — PAROXETINE HCL 10 MG PO TABS: 10.0000 mg | ORAL_TABLET | Freq: Every day | ORAL | 3 refills | Status: DC

## 2019-08-26 MED ORDER — ALPRAZOLAM 0.25 MG PO TABS: 0.5000 mg | ORAL_TABLET | Freq: Every evening | ORAL | 1 refills | Status: DC | PRN

## 2019-08-26 NOTE — Progress Notes (Signed)
BP 139/84   Pulse 71   Temp 97.8 F (36.6 C) (Temporal)   Ht '5\' 4"'  (1.626 m)   Wt 177 lb (80.3 kg)   LMP 11/26/2010   SpO2 97%   BMI 30.38 kg/m    Subjective:   Patient ID: Kayla Romero, female    DOB: [birth year], 64 y.o.   MRN: [id number]  HPI: Kayla Romero is a 64 y.o. female presenting on 08/26/2019 for Depression (check up of chronic medical medical conditions- Patient has seen Cardo since last seen )   HPI Depression Patient is coming in for recheck of anxiety and depression.  She says she has been having a lot more anxiety recently and with her PVCs that is not been helping the anxiety.  She did see cardiologist to evaluate her heart and that it was with PVCs.  She has been increasing her usage of Xanax and taking it almost daily.  She is still taking her Wellbutrin as well.  Patient denies any suicidal ideations or thoughts of hurting herself. Depression screen [facility] 2/9 08/26/2019 03/09/2019 02/09/2019 08/09/2018 06/28/2018  Decreased Interest 3 0 0 0 0  Down, Depressed, Hopeless 0 0 0 0 0  PHQ - 2 Score 3 0 0 0 0  Altered sleeping 1 - - - -  Tired, decreased energy 3 - - - -  Change in appetite 3 - - - -  Feeling bad or failure about yourself  0 - - - -  Trouble concentrating 3 - - - -  Moving slowly or fidgety/restless 0 - - - -  Suicidal thoughts 0 - - - -  PHQ-9 Score 13 - - - -  Difficult doing work/chores Not difficult at all - - - -   Current rx-Xanax 0.25 mg twice daily as needed # meds rx- 60 Effectiveness of current meds-works well but she has been having to use it more consistently rather than just every now and then recently. Adverse reactions form meds-none  Pill count performed-No Last drug screen -02/14/2019 ( high risk q7m moderate risk q647mlow risk yearly ) Urine drug screen today- Yes Was the N[place]viewed-yes  If yes were their any concerning findings? -None  No flowsheet data found.   Controlled substance contract signed on:  Today  Relevant past medical, surgical, family and social history reviewed and updated as indicated. Interim medical history since our last visit reviewed. Allergies and medications reviewed and updated.  Review of Systems  Constitutional: Negative for chills and fever.  Eyes: Negative for visual disturbance.  Respiratory: Negative for chest tightness and shortness of breath.   Cardiovascular: Negative for chest pain and leg swelling.  Musculoskeletal: Negative for back pain and gait problem.  Skin: Negative for rash.  Neurological: Negative for light-headedness and headaches.  Psychiatric/Behavioral: Positive for dysphoric mood and sleep disturbance. Negative for agitation, behavioral problems, self-injury and suicidal ideas. The patient is nervous/anxious.   All other systems reviewed and are negative.   Per HPI unless specifically indicated above   Allergies as of 08/26/2019      Reactions   No Known Allergies       Medication List       Accurate as of August 26, 2019 11:46 AM. If you have any questions, ask your nurse or doctor.        STOP taking these medications   metoprolol succinate 25 MG 24 hr tablet Commonly known as: TOPROL-XL Stopped by: JoWorthy RancherMD  TAKE these medications   ALPRAZolam 0.25 MG tablet Commonly known as: XANAX Take 2 tablets (0.5 mg total) by mouth at bedtime as needed (panic attack).   buPROPion 150 MG 24 hr tablet Commonly known as: WELLBUTRIN XL TAKE 1 TABLET BY MOUTH EVERY DAY IN THE MORNING   fluticasone 50 MCG/ACT nasal spray Commonly known as: FLONASE SPRAY 2 SPRAYS INTO EACH NOSTRIL EVERY DAY   PARoxetine 10 MG tablet Commonly known as: Paxil Take 1 tablet (10 mg total) by mouth daily. Started by: Worthy Rancher, MD   SYSTANE ULTRA OP Apply to eye.   UNABLE TO FIND [facility]        Objective:   BP 139/84   Pulse 71   Temp 97.8 F (36.6 C) (Temporal)   Ht '5\' 4"'  (1.626 m)   Wt 177 lb (80.3  kg)   LMP 11/26/2010   SpO2 97%   BMI 30.38 kg/m   Wt Readings from Last 3 Encounters:  08/26/19 177 lb (80.3 kg)  08/16/19 177 lb 3.2 oz (80.4 kg)  05/13/19 177 lb 6.4 oz (80.5 kg)    Physical Exam Vitals and nursing note reviewed.  Constitutional:      General: She is not in acute distress.    Appearance: She is well-developed. She is not diaphoretic.  Eyes:     Conjunctiva/sclera: Conjunctivae normal.  Cardiovascular:     Rate and Rhythm: Normal rate and regular rhythm.     Heart sounds: Normal heart sounds. No murmur.  Pulmonary:     Effort: Pulmonary effort is normal. No respiratory distress.     Breath sounds: Normal breath sounds. No wheezing.  Musculoskeletal:        General: No tenderness. Normal range of motion.  Skin:    General: Skin is warm and dry.     Findings: No rash.  Neurological:     Mental Status: She is alert and oriented to person, place, and time.     Coordination: Coordination normal.  Psychiatric:        Behavior: Behavior normal.       Assessment & Plan:   Problem List Items Addressed This Visit      Other   Anxiety   Relevant Medications   ALPRAZolam (XANAX) 0.25 MG tablet   buPROPion (WELLBUTRIN XL) 150 MG 24 hr tablet   PARoxetine (PAXIL) 10 MG tablet   Other Relevant Orders   CBC with Differential/Platelet   TSH    Other Visit Diagnoses    Elevated TSH    -  Primary   Relevant Orders   TSH   Diabetes mellitus screening       Relevant Orders   CMP14+EGFR   Lipid screening       Relevant Orders   Lipid panel      Patient hypothyroid previously we will recheck her thyroid Its been up and down borderline  We will start Paxil for the patient, continue Wellbutrin and Xanax but recommended that she not use the Xanax every day and back off on using it every day. Follow up plan: Return in about 4 weeks (around 09/23/2019), or if symptoms worsen or fail to improve, for anxiety recheck.  Counseling provided for all of the  vaccine components Orders Placed This Encounter  Procedures  . CBC with Differential/Platelet  . CMP14+EGFR  . Lipid panel  . TSH    Caryl Pina, MD [facility] 08/26/2019, 11:46 AM

## 2019-08-26 NOTE — Addendum Note (Signed)
Addended by: Karle Plumber on: 08/26/2019 02:14 PM   Modules accepted: Orders

## 2019-08-27 LAB — CMP14+EGFR
ALT: 30 IU/L (ref 0–32)
AST: 24 IU/L (ref 0–40)
Albumin/Globulin Ratio: 1.6 (ref 1.2–2.2)
Albumin: 4.4 g/dL (ref 3.8–4.8)
Alkaline Phosphatase: 130 IU/L — ABNORMAL HIGH (ref 39–117)
BUN/Creatinine Ratio: 12 (ref 12–28)
BUN: 10 mg/dL (ref 8–27)
Bilirubin Total: 0.5 mg/dL (ref 0.0–1.2)
CO2: 25 mmol/L (ref 20–29)
Calcium: 9.3 mg/dL (ref 8.7–10.3)
Chloride: 102 mmol/L (ref 96–106)
Creatinine, Ser: 0.82 mg/dL (ref 0.57–1.00)
GFR calc Af Amer: 89 mL/min/{1.73_m2} (ref >59)
GFR calc non Af Amer: 77 mL/min/{1.73_m2} (ref >59)
Globulin, Total: 2.7 g/dL (ref 1.5–4.5)
Glucose: 90 mg/dL (ref 65–99)
Potassium: 3.8 mmol/L (ref 3.5–5.2)
Sodium: 140 mmol/L (ref 134–144)
Total Protein: 7.1 g/dL (ref 6.0–8.5)

## 2019-08-27 LAB — LIPID PANEL
Chol/HDL Ratio: 2.9 ratio (ref 0.0–4.4)
Cholesterol, Total: 161 mg/dL (ref 100–199)
HDL: 56 mg/dL (ref >39)
LDL Chol Calc (NIH): 87 mg/dL (ref 0–99)
Triglycerides: 99 mg/dL (ref 0–149)
VLDL Cholesterol Cal: 18 mg/dL (ref 5–40)

## 2019-08-27 LAB — CBC WITH DIFFERENTIAL/PLATELET
Basophils Absolute: 0 10*3/uL (ref 0.0–0.2)
Basos: 1 %
EOS (ABSOLUTE): 0.1 10*3/uL (ref 0.0–0.4)
Eos: 2 %
Hematocrit: 40.2 % (ref 34.0–46.6)
Hemoglobin: 14.1 g/dL (ref 11.1–15.9)
Immature Grans (Abs): 0 10*3/uL (ref 0.0–0.1)
Immature Granulocytes: 0 %
Lymphocytes Absolute: 2.2 10*3/uL (ref 0.7–3.1)
Lymphs: 39 %
MCH: 31.1 pg (ref 26.6–33.0)
MCHC: 35.1 g/dL (ref 31.5–35.7)
MCV: 89 fL (ref 79–97)
Monocytes Absolute: 0.4 10*3/uL (ref 0.1–0.9)
Monocytes: 8 %
Neutrophils Absolute: 2.8 10*3/uL (ref 1.4–7.0)
Neutrophils: 50 %
Platelets: 302 10*3/uL (ref 150–450)
RBC: 4.53 x10E6/uL (ref 3.77–5.28)
RDW: 12.2 % (ref 11.7–15.4)
WBC: 5.6 10*3/uL (ref 3.4–10.8)

## 2019-08-27 LAB — TSH: TSH: 3.75 u[IU]/mL (ref 0.450–4.500)

## 2019-08-30 LAB — TOXASSURE SELECT 13 (MW), URINE

## 2019-09-08 DIAGNOSIS — Z23 Encounter for immunization: Secondary | ICD-10-CM | POA: Diagnosis not present

## 2019-09-13 DIAGNOSIS — H35372 Puckering of macula, left eye: Secondary | ICD-10-CM | POA: Diagnosis not present

## 2019-09-13 DIAGNOSIS — H35042 Retinal micro-aneurysms, unspecified, left eye: Secondary | ICD-10-CM | POA: Diagnosis not present

## 2019-09-13 DIAGNOSIS — H35032 Hypertensive retinopathy, left eye: Secondary | ICD-10-CM | POA: Diagnosis not present

## 2019-09-13 DIAGNOSIS — H353132 Nonexudative age-related macular degeneration, bilateral, intermediate dry stage: Secondary | ICD-10-CM | POA: Diagnosis not present

## 2019-09-23 ENCOUNTER — Encounter: Payer: Self-pay | Admitting: Family Medicine

## 2019-09-23 ENCOUNTER — Other Ambulatory Visit: Payer: Self-pay

## 2019-09-23 ENCOUNTER — Ambulatory Visit: Payer: BC Managed Care – PPO | Admitting: Family Medicine

## 2019-09-23 VITALS — BP 117/70 | HR 71 | Temp 98.0°F | Ht 64.0 in | Wt 179.0 lb

## 2019-09-23 DIAGNOSIS — F419 Anxiety disorder, unspecified: Secondary | ICD-10-CM | POA: Diagnosis not present

## 2019-09-23 DIAGNOSIS — F3341 Major depressive disorder, recurrent, in partial remission: Secondary | ICD-10-CM

## 2019-09-23 DIAGNOSIS — I493 Ventricular premature depolarization: Secondary | ICD-10-CM

## 2019-09-23 MED ORDER — LABETALOL HCL 100 MG PO TABS: 50.0000 mg | ORAL_TABLET | ORAL | 1 refills | Status: DC | PRN

## 2019-09-23 MED ORDER — FLUOXETINE HCL 20 MG PO CAPS: 20.0000 mg | ORAL_CAPSULE | Freq: Every day | ORAL | 1 refills | Status: DC

## 2019-09-23 NOTE — Progress Notes (Signed)
BP 117/70   Pulse 71   Temp 98 F (36.7 C)   Ht 5\' 4"  (1.626 m)   Wt 179 lb (81.2 kg)   LMP 11/26/2010   SpO2 97%   BMI 30.73 kg/m    Subjective:   Patient ID: Kayla Romero, female    DOB: 06-Mar-1956, 64 y.o.   MRN: [id number]  HPI: Kayla Romero is a 64 y.o. female presenting on 09/23/2019 for Medical Management of Chronic Issues and Anxiety   HPI Anxiety recheck Patient scores high on her gad score.  She says she mostly has anxiety that has gotten worse anxiety is built up and she started taking Xanax almost daily.  She does cut in half and takes half a tablet when she does take it.  She tried Wellbutrin and Paxil and felt like she has had it for stability.  We will try something else.  Patient denies any suicidal ideations or thoughts of hurting self.  Patient has occasional PVCs related to her anxiety.  Relevant past medical, surgical, family and social history reviewed and updated as indicated. Interim medical history since our last visit reviewed. Allergies and medications reviewed and updated.  Review of Systems  Constitutional: Negative for chills and fever.  Eyes: Negative for visual disturbance.  Respiratory: Negative for chest tightness and shortness of breath.   Cardiovascular: Negative for chest pain and leg swelling.  Musculoskeletal: Negative for back pain and gait problem.  Skin: Negative for rash.  Neurological: Negative for light-headedness and headaches.  Psychiatric/Behavioral: Negative for agitation and behavioral problems.  All other systems reviewed and are negative.   Per HPI unless specifically indicated above   Allergies as of 09/23/2019      Reactions   No Known Allergies       Medication List       Accurate as of September 23, 2019  9:11 AM. If you have any questions, ask your nurse or doctor.        STOP taking these medications   UNABLE TO FIND Stopped by: Fransisca Kaufmann Dettinger, MD     TAKE these medications   ALPRAZolam 0.25 MG  tablet Commonly known as: XANAX Take 2 tablets (0.5 mg total) by mouth at bedtime as needed (panic attack).   buPROPion 150 MG 24 hr tablet Commonly known as: WELLBUTRIN XL TAKE 1 TABLET BY MOUTH EVERY DAY IN THE MORNING   fluticasone 50 MCG/ACT nasal spray Commonly known as: FLONASE SPRAY 2 SPRAYS INTO EACH NOSTRIL EVERY DAY   PARoxetine 10 MG tablet Commonly known as: Paxil Take 1 tablet (10 mg total) by mouth daily.   SYSTANE ULTRA OP Apply to eye.        Objective:   BP 117/70   Pulse 71   Temp 98 F (36.7 C)   Ht 5\' 4"  (1.626 m)   Wt 179 lb (81.2 kg)   LMP 11/26/2010   SpO2 97%   BMI 30.73 kg/m   Wt Readings from Last 3 Encounters:  09/23/19 179 lb (81.2 kg)  08/26/19 177 lb (80.3 kg)  08/16/19 177 lb 3.2 oz (80.4 kg)    Physical Exam Vitals and nursing note reviewed.  Constitutional:      General: She is not in acute distress.    Appearance: She is well-developed. She is not diaphoretic.  Eyes:     Conjunctiva/sclera: Conjunctivae normal.  Cardiovascular:     Rate and Rhythm: Normal rate. Rhythm irregular.  Pulmonary:  Effort: Pulmonary effort is normal. No respiratory distress.     Breath sounds: Normal breath sounds. No wheezing.  Musculoskeletal:        General: No tenderness. Normal range of motion.  Skin:    General: Skin is warm and dry.     Findings: No rash.  Neurological:     Mental Status: She is alert and oriented to person, place, and time.     Coordination: Coordination normal.  Psychiatric:        Behavior: Behavior normal.       Assessment & Plan:   Problem List Items Addressed This Visit      Other   Anxiety   Relevant Medications   FLUoxetine (PROZAC) 20 MG capsule   Depression - Primary   Relevant Medications   FLUoxetine (PROZAC) 20 MG capsule    Other Visit Diagnoses    PVC's (premature ventricular contractions)       Relevant Medications   labetalol (NORMODYNE) 100 MG tablet      Will start Prozac  pills, Follow up plan: Return in about 4 weeks (around 10/21/2019), or if symptoms worsen or fail to improve, for Anxiety recheck.  Counseling provided for all of the vaccine components No orders of the defined types were placed in this encounter.   Caryl Pina, MD [facility] 09/23/2019, 9:11 AM

## 2019-10-08 DIAGNOSIS — Z23 Encounter for immunization: Secondary | ICD-10-CM | POA: Diagnosis not present

## 2019-10-10 ENCOUNTER — Telehealth: Payer: Self-pay | Admitting: Family Medicine

## 2019-10-10 NOTE — Telephone Encounter (Signed)
Please advise on decreasing dose with new script.

## 2019-10-10 NOTE — Telephone Encounter (Signed)
Pt called to see if Dr Dettinger could call her in a lower dose of the Fluoxetine Rx to try. Says currently she is taking 20 mg but says it is making her nauseous.

## 2019-10-11 ENCOUNTER — Other Ambulatory Visit: Payer: Self-pay | Admitting: Family Medicine

## 2019-10-11 DIAGNOSIS — J069 Acute upper respiratory infection, unspecified: Secondary | ICD-10-CM

## 2019-10-11 MED ORDER — FLUOXETINE HCL 10 MG PO CAPS: 10.0000 mg | ORAL_CAPSULE | Freq: Every day | ORAL | 2 refills | Status: DC

## 2019-10-11 NOTE — Telephone Encounter (Signed)
Sent fluoxetine 10 mg for the patient.

## 2019-10-11 NOTE — Telephone Encounter (Signed)
Patient aware and verbalized understanding. °

## 2019-11-02 ENCOUNTER — Other Ambulatory Visit: Payer: Self-pay | Admitting: Family Medicine

## 2019-11-04 ENCOUNTER — Encounter: Payer: Self-pay | Admitting: Family Medicine

## 2019-11-04 ENCOUNTER — Ambulatory Visit: Payer: BC Managed Care – PPO | Admitting: Family Medicine

## 2019-11-04 ENCOUNTER — Other Ambulatory Visit: Payer: Self-pay

## 2019-11-04 VITALS — BP 127/71 | HR 58 | Temp 97.2°F | Ht 64.0 in | Wt 177.2 lb

## 2019-11-04 DIAGNOSIS — F419 Anxiety disorder, unspecified: Secondary | ICD-10-CM | POA: Diagnosis not present

## 2019-11-04 NOTE — Progress Notes (Signed)
BP 127/71   Pulse (!) 58   Temp (!) 97.2 F (36.2 C) (Temporal)   Ht '5\' 4"'  (1.626 m)   Wt 80.4 kg   LMP 11/26/2010   BMI 30.42 kg/m    Subjective:   Patient ID: Kayla Romero, female    DOB: [birth year], 64 y.o.   MRN: [id number]  HPI: Kayla Romero is a 64 y.o. female presenting on 11/04/2019 for Follow Up Anxiety and Depression.   1. Anxiety/Depression:  Ms. Kayla Romero is currently taking Prozac 10 mg QD and Xanax as needed for her Anxiety/Depression. She reports that she is still having some nausea with the Prozac even though she takes it after eating some breakfast. Ms. Kayla Romero states that she is feeling somewhat better and has been more relaxed and less agitated. She still has PVCs in the mornings but she is not as worried about them. She has been trying to increase potassium and magnesium in her diet lately, so she would like to have potassium and magnesium levels checked today. Current rx-alprazolam 0.25 twice daily. # meds rx-60 Effectiveness of current meds-works well Adverse reactions form meds-none  Pill count performed-No Last drug screen -09/13/2019 ( high risk q41m moderate risk q626mlow risk yearly ) Urine drug screen today- No Was the NCLaonaeviewed-yes  If yes were their any concerning findings? -None  No flowsheet data found.   Controlled substance contract signed on: 09/13/2019  2. Left Shoulder Pain:  [name] that she has been having left shoulder pain for about 2 weeks that radiates down into her upper arm. She describes the pain as sharp when she performs certain motions, such as reaching backwards and reaching over her head. She has taken Tylenol which did help with the pain.  3. Bilateral Hand Tingling:  Ms. VePinckneyeports tingling and numbness in both hands. This has been going on for 3-4 weeks and it will sometimes wake her up at night. The tingling is isolated to the fourth and fifth fingers of each hands. She does sleep with her elbow  bent.  Relevant past medical, surgical, family and social history reviewed and updated as indicated. Interim medical history since our last visit reviewed.  Allergies and medications reviewed and updated.  Review of Systems  Cardiovascular: Positive for palpitations (PVCs).  Gastrointestinal: Positive for nausea.  Musculoskeletal: Positive for myalgias. Negative for joint swelling.  Neurological:       Positive for tingling in bilateral hands (4th and 5th fingers).  Psychiatric/Behavioral: The patient is nervous/anxious.    Per HPI unless specifically indicated above  Allergies as of 11/04/2019      Reactions   No Known Allergies       Medication List       Accurate as of Nov 04, 2019 11:02 AM. If you have any questions, ask your nurse or doctor.        ALPRAZolam 0.25 MG tablet Commonly known as: XANAX Take 2 tablets (0.5 mg total) by mouth at bedtime as needed (panic attack).   FLUoxetine 10 MG capsule Commonly known as: PROZAC TAKE 1 CAPSULE BY MOUTH EVERY DAY   fluticasone 50 MCG/ACT nasal spray Commonly known as: FLONASE SPRAY 2 SPRAYS INTO EACH NOSTRIL EVERY DAY   labetalol 100 MG tablet Commonly known as: NORMODYNE Take 0.5 tablets (50 mg total) by mouth as needed.   SYSTANE ULTRA OP Apply to eye.   Vitamin D3 25 MCG (1000 UT) Caps Take 2,000 Units by mouth daily.  Objective:   BP 127/71   Pulse (!) 58   Temp (!) 97.2 F (36.2 C) (Temporal)   Ht '5\' 4"'  (1.626 m)   Wt 80.4 kg   LMP 11/26/2010   BMI 30.42 kg/m   Wt Readings from Last 3 Encounters:  11/04/19 80.4 kg  09/23/19 81.2 kg  08/26/19 80.3 kg    Physical Exam Constitutional:      General: She is not in acute distress.    Appearance: Normal appearance.  Cardiovascular:     Rate and Rhythm: Normal rate and regular rhythm.     Comments: S1/S2 auscultated. Radial pulses palpated bilaterally. Pulmonary:     Effort: Pulmonary effort is normal.     Breath sounds: Normal breath  sounds. No wheezing, rhonchi or rales.  Musculoskeletal:     Right shoulder: Normal range of motion. Normal strength.     Left shoulder: Tenderness present. Decreased range of motion. Decreased strength.  Skin:    General: Skin is warm and dry.  Neurological:     Mental Status: She is alert and oriented to person, place, and time.     Coordination: Coordination normal.     Gait: Gait normal.  Psychiatric:        Mood and Affect: Mood normal.        Behavior: Behavior normal.        Thought Content: Thought content normal.        Judgment: Judgment normal.    Assessment & Plan:   Problem List Items Addressed This Visit      Other   Anxiety - Primary   Relevant Orders   BMP8+EGFR (Completed)   Magnesium (Completed)      1. Anxiety/Depression:  Continue on Prozac 10 mg QD. Will check K+ and Mg with CMP today. Follow up in 3 months for next recheck.  2. Left Shoulder Pain:  Likely a muscle sprain due to the patient's history, symptoms, and physical exam findings. [name] was advised to use OTC anti-inflammatories (ex. Ibuprofen) and gentle stretching.  3. Bilateral Hand Numbness (4th and 5th fingers):  Likely due to ulnar nerve compression because of Ms. Kayla Romero's regular sleeping position at night. [name] was advised to wear a wrist brace at night on her left wrist (since that is the hand that is more affected) and see if that helps her symptoms . Follow up plan:  Follow up in 3 months or earlier if symptoms worsen or fail to improve.  Gaynelle Arabian, PA-S2 [place] 11/04/2019, 11:02 AM  I was personally present for all components of the history, physical exam and/or medical decision making.  I agree with the documentation performed by the PA student and agree with assessment and plan above.  PA student was Gaynelle Arabian, [place] student. Caryl Pina, MD [facility] 11/11/2019, 8:01 AM

## 2019-11-05 LAB — MAGNESIUM: Magnesium: 2.3 mg/dL (ref 1.6–2.3)

## 2019-11-05 LAB — BMP8+EGFR
BUN/Creatinine Ratio: 18 (ref 12–28)
BUN: 13 mg/dL (ref 8–27)
CO2: 22 mmol/L (ref 20–29)
Calcium: 9.5 mg/dL (ref 8.7–10.3)
Chloride: 103 mmol/L (ref 96–106)
Creatinine, Ser: 0.72 mg/dL (ref 0.57–1.00)
GFR calc Af Amer: 104 mL/min/{1.73_m2} (ref >59)
GFR calc non Af Amer: 90 mL/min/{1.73_m2} (ref >59)
Glucose: 90 mg/dL (ref 65–99)
Potassium: 4.4 mmol/L (ref 3.5–5.2)
Sodium: 142 mmol/L (ref 134–144)

## 2019-12-15 ENCOUNTER — Other Ambulatory Visit: Payer: Self-pay | Admitting: Family Medicine

## 2019-12-15 DIAGNOSIS — I493 Ventricular premature depolarization: Secondary | ICD-10-CM

## 2020-01-25 ENCOUNTER — Other Ambulatory Visit: Payer: Self-pay | Admitting: Family Medicine

## 2020-01-30 ENCOUNTER — Other Ambulatory Visit: Payer: Self-pay

## 2020-01-30 ENCOUNTER — Ambulatory Visit (INDEPENDENT_AMBULATORY_CARE_PROVIDER_SITE_OTHER): Payer: BC Managed Care – PPO | Admitting: Cardiology

## 2020-01-30 ENCOUNTER — Encounter: Payer: Self-pay | Admitting: Cardiology

## 2020-01-30 VITALS — BP 138/84 | HR 57 | Ht 64.5 in | Wt 177.0 lb

## 2020-01-30 DIAGNOSIS — R002 Palpitations: Secondary | ICD-10-CM

## 2020-01-30 DIAGNOSIS — F419 Anxiety disorder, unspecified: Secondary | ICD-10-CM

## 2020-01-30 DIAGNOSIS — I493 Ventricular premature depolarization: Secondary | ICD-10-CM

## 2020-01-30 NOTE — Patient Instructions (Signed)
Medication Instructions:  Your physician recommends that you continue on your current medications as directed. Please refer to the Current Medication list given to you today.  *If you need a refill on your cardiac medications before your next appointment, please call your pharmacy*   Testing/Procedures: Your physician has requested that you have an exercise tolerance test. For further information please visit [email or web address]. Please also follow instruction sheet, as given.   Follow-Up: At [facility], you and your health needs are our priority.  As part of our continuing mission to provide you with exceptional heart care, we have created designated Provider Care Teams.  These Care Teams include your primary Cardiologist (physician) and Advanced Practice Providers (APPs -  Physician Assistants and Nurse Practitioners) who all work together to provide you with the care you need, when you need it.  We recommend signing up for the patient portal called "MyChart".  Sign up information is provided on this After Visit Summary.  MyChart is used to connect with patients for Virtual Visits (Telemedicine).  Patients are able to view lab/test results, encounter notes, upcoming appointments, etc.  Non-urgent messages can be sent to your provider as well.   To learn more about what you can do with MyChart, go to [email or web address].    Your next appointment:   6 month(s)  The format for your next appointment:   In Person  Provider:   You may see Quay Burow, MD or one of the following Advanced Practice Providers on your designated Care Team:    Kerin Ransom, PA-C  [place], [place]  Coletta Memos, [place]    Other Instructions Please call our office 2 months in advance to schedule your follow-up appointment.

## 2020-01-30 NOTE — Assessment & Plan Note (Signed)
Echo-normal Sept 2020 Monitor- PVCs-trigeminy

## 2020-01-30 NOTE — Progress Notes (Signed)
Cardiology Office Note:    Date:  01/30/2020   ID:  Kayla, Romero Jul 28, 1955, MRN [id number]  PCP:  Dettinger, Fransisca Kaufmann, MD  Cardiologist:  Quay Burow, MD  Electrophysiologist:  None   Referring MD: Dettinger, Fransisca Kaufmann, MD   CC: palpitations  History of Present Illness:    Kayla Romero is a pleasant 64 y.o. female with a hx of palpitations and anxiety and depression.  She was evaluated by Dr. Gwenlyn Found.  Echocardiogram in September 2020 showed normal LV function.  Monitor showed PVCs and periods of trigeminy with baseline bradycardia.  As needed metoprolol was prescribed but the patient was hesitant to take it after she read it may lower her blood pressure.  She notes that her blood pressure usually runs in the 54Y to 706 systolic.  Her heart rate runs in the 50-60 range.  She is in the office today for routine follow-up.  She continues to have palpitations.  She describes extra beats.  Her PCP prescribed Normodyne 50 mg 1/2 tablet PRN which she has not yet tried.  Past Medical History:  Diagnosis Date  . Anxiety    h/o, recently has not had to use xanax much.  . 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 Depression   . Fatty liver   . History of EKG    first presented /w L shoulder pain, 2007, seen by [facility]. , had EKG then & it was wnl.    . Macular degeneration   . Mediastinal mass   . Obesity (BMI 30-39.9)   . RUQ pain   . 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 Arlyce Dice & then a Angelina Sheriff MD fr. [facility]    . 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 2007, seen by Dr. Arlyce Dice, but now followed by [facility] in [place]  . Vitamin D deficiency     Past Surgical History:  Procedure Laterality Date  . CHOLECYSTECTOMY N/A 08/31/2012   Procedure: LAPAROSCOPIC CHOLECYSTECTOMY possible IOC ;  Surgeon: Rolm Bookbinder, MD;  Location: [facility] OR;  Service: General;  Laterality: N/A;  . EYE SURGERY  2008   bilateral removal of  cataracts, /w IOL  . TUBAL LIGATION  1988    Current Medications: Current Meds  Medication Sig  . ALPRAZolam (XANAX) 0.25 MG tablet Take 2 tablets (0.5 mg total) by mouth at bedtime as needed (panic attack).  . Cholecalciferol (VITAMIN D3) 25 MCG (1000 UT) CAPS Take 2,000 Units by mouth daily.  Marland Kitchen FLUoxetine (PROZAC) 10 MG capsule TAKE 1 CAPSULE BY MOUTH EVERY DAY  . fluticasone (FLONASE) 50 MCG/ACT nasal spray SPRAY 2 SPRAYS INTO EACH NOSTRIL EVERY DAY  . labetalol (NORMODYNE) 100 MG tablet TAKE 0.5 TABLETS (50 MG TOTAL) BY MOUTH AS NEEDED.  Marland Kitchen Polyethyl Glycol-Propyl Glycol (SYSTANE ULTRA OP) Apply to eye.     Allergies:   No known allergies   Social History   Socioeconomic History  . Marital status: Married    Spouse name: Not on file  . Number of children: Not on file  . Years of education: Not on file  . Highest education level: Not on file  Occupational History  . Not on file  Tobacco Use  . Smoking status: Former Smoker    Types: Cigarettes    Quit date: 05/31/2015    Years since quitting: 4.6  . Smokeless tobacco: Never Used  .  Tobacco comment: quit 2 weeks ago completely. had quit in 2012 but restarted  Substance and Sexual Activity  . Alcohol use: No  . Drug use: No  . Sexual activity: Not on file  Other Topics Concern  . Not on file  Social History Narrative  . Not on file   Social Determinants of Health   Financial Resource Strain:   . Difficulty of Paying Living Expenses:   Food Insecurity:   . Worried About Charity fundraiser in the Last Year:   . Arboriculturist in the Last Year:   Transportation Needs:   . Film/video editor (Medical):   Marland Kitchen Lack of Transportation (Non-Medical):   Physical Activity:   . Days of Exercise per Week:   . Minutes of Exercise per Session:   Stress:   . Feeling of Stress :   Social Connections:   . Frequency of Communication with Friends and Family:   . Frequency of Social Gatherings with Friends and Family:   .  Attends Religious Services:   . Active Member of Clubs or Organizations:   . Attends Archivist Meetings:   Marland Kitchen Marital Status:      Family History: The patient's family history includes Aneurysm in her father; Cancer in her mother; Cirrhosis in her mother; Heart disease in her mother; Kidney disease in her mother.  ROS:   Please see the history of present illness.     All other systems reviewed and are negative.  EKGs/Labs/Other Studies Reviewed:    The following studies were reviewed today:  Echo Sept 2020- IMPRESSIONS   1. Left ventricular ejection fraction, by visual estimation, is 60 to  65%. The left ventricle has normal function. There is no left ventricular  hypertrophy.  2. Left ventricular diastolic Doppler parameters are indeterminate  pattern of LV diastolic filling.  3. Global right ventricle has normal systolic function.The right  ventricular size is normal. No increase in right ventricular wall  Thickness.  14 day ZIO Nov 2020- 1: Sinus rhythm/sinus bradycardia/sinus tachycardia 2: Frequent PVCs occasional in a trigeminal pattern  EKG:  EKG is  ordered today.  The ekg ordered today demonstrates NSR SB-57  Recent Labs: 08/26/2019: ALT 30; Hemoglobin 14.1; Platelets 302; TSH 3.750 11/04/2019: BUN 13; Creatinine, Ser 0.72; Magnesium 2.3; Potassium 4.4; Sodium 142  Recent Lipid Panel    Component Value Date/Time   CHOL 161 08/26/2019 1153   TRIG 99 08/26/2019 1153   TRIG 120 07/18/2013 1451   HDL 56 08/26/2019 1153   HDL 41 07/18/2013 1451   CHOLHDL 2.9 08/26/2019 1153   LDLCALC 87 08/26/2019 1153   LDLCALC 76 07/18/2013 1451    Physical Exam:    VS:  BP (!) 138/84   Pulse 57   Ht 5' 4.5" (1.638 m)   Wt 177 lb (80.3 kg)   LMP 11/26/2010   SpO2 99%   BMI 29.91 kg/m     Wt Readings from Last 3 Encounters:  01/30/20 177 lb (80.3 kg)  11/04/19 177 lb 4 oz (80.4 kg)  09/23/19 179 lb (81.2 kg)     GEN: Overweight caucasian female, well  developed in no acute distress HEENT: Normal NECK: No JVD;  CARDIAC: RRR, MUSCULOSKELETAL:  No edema; No deformity  SKIN: Warm and dry NEUROLOGIC:  Alert and oriented x 3 PSYCHIATRIC:  Normal affect   ASSESSMENT:    Palpitations Echo-normal Sept 2020 Monitor- PVCs-trigeminy   Anxiety H/O anxiety and depression- on Prozac  PLAN:    She wants to start exercising- check GXT first.  F/U Dr Gwenlyn Found in 6 months.    Medication Adjustments/Labs and Tests Ordered: Current medicines are reviewed at length with the patient today.  Concerns regarding medicines are outlined above.  Orders Placed This Encounter  Procedures  . Exercise Tolerance Test  . EKG 12-Lead   No orders of the defined types were placed in this encounter.   Patient Instructions  Medication Instructions:  Your physician recommends that you continue on your current medications as directed. Please refer to the Current Medication list given to you today.  *If you need a refill on your cardiac medications before your next appointment, please call your pharmacy*   Testing/Procedures: Your physician has requested that you have an exercise tolerance test. For further information please visit [email or web address]. Please also follow instruction sheet, as given.   Follow-Up: At [facility], you and your health needs are our priority.  As part of our continuing mission to provide you with exceptional heart care, we have created designated Provider Care Teams.  These Care Teams include your primary Cardiologist (physician) and Advanced Practice Providers (APPs -  Physician Assistants and Nurse Practitioners) who all work together to provide you with the care you need, when you need it.  We recommend signing up for the patient portal called "MyChart".  Sign up information is provided on this After Visit Summary.  MyChart is used to connect with patients for Virtual Visits (Telemedicine).  Patients are able to view lab/test  results, encounter notes, upcoming appointments, etc.  Non-urgent messages can be sent to your provider as well.   To learn more about what you can do with MyChart, go to [email or web address].    Your next appointment:   6 month(s)  The format for your next appointment:   In Person  Provider:   You may see Quay Burow, MD or one of the following Advanced Practice Providers on your designated Care Team:    Kerin Ransom, PA-C  [place], [place]  Coletta Memos, [place]    Other Instructions Please call our office 2 months in advance to schedule your follow-up appointment.     Angelena Form, PA-C  01/30/2020 10:30 AM    [facility]

## 2020-01-30 NOTE — Assessment & Plan Note (Signed)
H/O anxiety and depression- on Prozac

## 2020-02-02 ENCOUNTER — Telehealth (HOSPITAL_COMMUNITY): Payer: Self-pay

## 2020-02-02 NOTE — Telephone Encounter (Signed)
Encounter complete. 

## 2020-02-03 ENCOUNTER — Encounter: Payer: Self-pay | Admitting: Family Medicine

## 2020-02-03 ENCOUNTER — Other Ambulatory Visit (HOSPITAL_COMMUNITY)
Admission: RE | Admit: 2020-02-03 | Discharge: 2020-02-03 | Disposition: A | Discharge status: Home / Self Care | Payer: BC Managed Care – PPO | Source: Ambulatory Visit | Attending: Cardiovascular Disease | Admitting: Cardiovascular Disease

## 2020-02-03 ENCOUNTER — Ambulatory Visit: Payer: BC Managed Care – PPO | Admitting: Family Medicine

## 2020-02-03 ENCOUNTER — Other Ambulatory Visit: Payer: Self-pay

## 2020-02-03 VITALS — BP 127/77 | HR 63 | Temp 98.2°F | Ht 64.5 in | Wt 178.5 lb

## 2020-02-03 DIAGNOSIS — Z01812 Encounter for preprocedural laboratory examination: Secondary | ICD-10-CM | POA: Insufficient documentation

## 2020-02-03 DIAGNOSIS — F419 Anxiety disorder, unspecified: Secondary | ICD-10-CM | POA: Diagnosis not present

## 2020-02-03 DIAGNOSIS — F3341 Major depressive disorder, recurrent, in partial remission: Secondary | ICD-10-CM | POA: Diagnosis not present

## 2020-02-03 DIAGNOSIS — Z20822 Contact with and (suspected) exposure to covid-19: Secondary | ICD-10-CM | POA: Insufficient documentation

## 2020-02-03 LAB — SARS CORONAVIRUS 2 (TAT 6-24 HRS): SARS Coronavirus 2: NEGATIVE

## 2020-02-03 MED ORDER — TRAZODONE HCL 50 MG PO TABS: 25.0000 mg | ORAL_TABLET | Freq: Every evening | ORAL | 3 refills | Status: DC | PRN

## 2020-02-03 MED ORDER — FLUOXETINE HCL 10 MG PO CAPS: 10.0000 mg | ORAL_CAPSULE | Freq: Every day | ORAL | 1 refills | Status: DC

## 2020-02-03 NOTE — Progress Notes (Signed)
BP 127/77   Pulse 63   Temp 98.2 F (36.8 C)   Ht 5' 4.5" (1.638 m)   Wt 178 lb 8 oz (81 kg)   LMP 11/26/2010   SpO2 95%   BMI 30.17 kg/m    Subjective:   Patient ID: Kayla Romero, female    DOB: 10-31-1955, 64 y.o.   MRN: [id number]  HPI: Kayla Romero is a 64 y.o. female presenting on 02/03/2020 for Medical Management of Chronic Issues and Anxiety   HPI Patient is coming in today for recheck of depression anxiety and sleep.  She says her sleep pattern has been shifted where she is waking up sometimes at night and then she will get her husband ready for the day and then she will sleep again in the morning which she has never done that before.  She says she is not able to fall asleep but as well but that she is also waking up and not able to refall asleep.  She has had a little bit anxiety although the Prozac is helping some with that.  She denies any suicidal ideations.  She does have some decreased energy as well with her sleep pattern changes.  Relevant past medical, surgical, family and social history reviewed and updated as indicated. Interim medical history since our last visit reviewed. Allergies and medications reviewed and updated.  Review of Systems  Constitutional: Negative for chills and fever.  Eyes: Negative for visual disturbance.  Respiratory: Negative for chest tightness and shortness of breath.   Cardiovascular: Negative for chest pain and leg swelling.  Skin: Negative for rash.  Neurological: Negative for light-headedness and headaches.  Psychiatric/Behavioral: Positive for dysphoric mood and sleep disturbance. Negative for agitation, behavioral problems, self-injury and suicidal ideas. The patient is nervous/anxious.   All other systems reviewed and are negative.   Per HPI unless specifically indicated above      Objective:   BP 127/77   Pulse 63   Temp 98.2 F (36.8 C)   Ht 5' 4.5" (1.638 m)   Wt 178 lb 8 oz (81 kg)   LMP 11/26/2010   SpO2  95%   BMI 30.17 kg/m   Wt Readings from Last 3 Encounters:  02/03/20 178 lb 8 oz (81 kg)  01/30/20 177 lb (80.3 kg)  11/04/19 177 lb 4 oz (80.4 kg)    Physical Exam Vitals and nursing note reviewed.  Constitutional:      General: She is not in acute distress.    Appearance: She is well-developed. She is not diaphoretic.  Eyes:     Conjunctiva/sclera: Conjunctivae normal.  Cardiovascular:     Rate and Rhythm: Normal rate and regular rhythm.     Heart sounds: Normal heart sounds. No murmur heard.   Pulmonary:     Effort: Pulmonary effort is normal. No respiratory distress.     Breath sounds: Normal breath sounds. No wheezing.  Musculoskeletal:        General: No tenderness.  Skin:    General: Skin is warm and dry.     Findings: No rash.  Neurological:     Mental Status: She is alert and oriented to person, place, and time.     Coordination: Coordination normal.  Psychiatric:        Mood and Affect: Mood is anxious and depressed.        Behavior: Behavior normal.        Thought Content: Thought content does not include suicidal ideation. Thought  content does not include suicidal plan.       Assessment & Plan:   Problem List Items Addressed This Visit      Other   Anxiety - Primary   Relevant Medications   FLUoxetine (PROZAC) 10 MG capsule   traZODone (DESYREL) 50 MG tablet   Depression   Relevant Medications   FLUoxetine (PROZAC) 10 MG capsule   traZODone (DESYREL) 50 MG tablet      Will try and add trazodone, keep the Prozac Follow up plan: Return if symptoms worsen or fail to improve, for 4 to 6-week recheck depression and anxiety and sleep.  Counseling provided for all of the vaccine components No orders of the defined types were placed in this encounter.   Caryl Pina, MD [facility] 02/03/2020, 2:27 PM

## 2020-02-07 ENCOUNTER — Other Ambulatory Visit: Payer: Self-pay

## 2020-02-07 ENCOUNTER — Ambulatory Visit (HOSPITAL_COMMUNITY)
Admission: RE | Admit: 2020-02-07 | Discharge: 2020-02-07 | Disposition: A | Discharge status: Home / Self Care | Payer: BC Managed Care – PPO | Source: Ambulatory Visit | Attending: Cardiovascular Disease | Admitting: Cardiovascular Disease

## 2020-02-07 DIAGNOSIS — R002 Palpitations: Secondary | ICD-10-CM

## 2020-02-07 DIAGNOSIS — I493 Ventricular premature depolarization: Secondary | ICD-10-CM | POA: Diagnosis not present

## 2020-02-07 LAB — EXERCISE TOLERANCE TEST
Estimated workload: 9.8 METS
Exercise duration (min): 7 min
Exercise duration (sec): 49 s
MPHR: 157 {beats}/min
Peak HR: 153 {beats}/min
Percent HR: 97 %
Rest HR: 63 {beats}/min

## 2020-02-14 ENCOUNTER — Other Ambulatory Visit: Payer: Self-pay | Admitting: *Deleted

## 2020-02-14 DIAGNOSIS — J069 Acute upper respiratory infection, unspecified: Secondary | ICD-10-CM

## 2020-02-14 MED ORDER — FLUTICASONE PROPIONATE 50 MCG/ACT NA SUSP: NASAL | 1 refills | Status: DC

## 2020-02-27 ENCOUNTER — Other Ambulatory Visit: Payer: Self-pay | Admitting: Family Medicine

## 2020-02-27 DIAGNOSIS — F3341 Major depressive disorder, recurrent, in partial remission: Secondary | ICD-10-CM

## 2020-02-27 DIAGNOSIS — F419 Anxiety disorder, unspecified: Secondary | ICD-10-CM

## 2020-03-13 DIAGNOSIS — H43821 Vitreomacular adhesion, right eye: Secondary | ICD-10-CM | POA: Diagnosis not present

## 2020-03-13 DIAGNOSIS — H353132 Nonexudative age-related macular degeneration, bilateral, intermediate dry stage: Secondary | ICD-10-CM | POA: Diagnosis not present

## 2020-03-13 DIAGNOSIS — H35042 Retinal micro-aneurysms, unspecified, left eye: Secondary | ICD-10-CM | POA: Diagnosis not present

## 2020-03-13 DIAGNOSIS — H35372 Puckering of macula, left eye: Secondary | ICD-10-CM | POA: Diagnosis not present

## 2020-03-19 ENCOUNTER — Ambulatory Visit: Payer: BC Managed Care – PPO | Admitting: Family Medicine

## 2020-04-18 ENCOUNTER — Other Ambulatory Visit: Payer: Self-pay

## 2020-04-18 ENCOUNTER — Ambulatory Visit (INDEPENDENT_AMBULATORY_CARE_PROVIDER_SITE_OTHER): Payer: BC Managed Care – PPO

## 2020-04-18 DIAGNOSIS — Z23 Encounter for immunization: Secondary | ICD-10-CM

## 2020-05-09 DIAGNOSIS — Z1231 Encounter for screening mammogram for malignant neoplasm of breast: Secondary | ICD-10-CM | POA: Diagnosis not present

## 2020-05-09 DIAGNOSIS — Z6831 Body mass index (BMI) 31.0-31.9, adult: Secondary | ICD-10-CM | POA: Diagnosis not present

## 2020-05-09 DIAGNOSIS — Z01419 Encounter for gynecological examination (general) (routine) without abnormal findings: Secondary | ICD-10-CM | POA: Diagnosis not present

## 2020-05-19 DIAGNOSIS — Z23 Encounter for immunization: Secondary | ICD-10-CM | POA: Diagnosis not present

## 2020-07-18 ENCOUNTER — Ambulatory Visit: Payer: BC Managed Care – PPO | Admitting: Family Medicine

## 2020-07-20 ENCOUNTER — Ambulatory Visit: Payer: Self-pay | Admitting: Family Medicine

## 2020-07-31 ENCOUNTER — Ambulatory Visit (INDEPENDENT_AMBULATORY_CARE_PROVIDER_SITE_OTHER): Payer: 59 | Admitting: Cardiovascular Disease

## 2020-07-31 ENCOUNTER — Encounter: Payer: Self-pay | Admitting: Cardiovascular Disease

## 2020-07-31 ENCOUNTER — Other Ambulatory Visit: Payer: Self-pay

## 2020-07-31 VITALS — BP 130/78 | HR 63 | Ht 64.0 in | Wt 179.8 lb

## 2020-07-31 DIAGNOSIS — R002 Palpitations: Secondary | ICD-10-CM | POA: Diagnosis not present

## 2020-07-31 DIAGNOSIS — I493 Ventricular premature depolarization: Secondary | ICD-10-CM

## 2020-07-31 NOTE — Progress Notes (Signed)
07/31/2020 Kayla Romero   09-Jun-1956  [id number]  Primary Physician Dettinger, Fransisca Kaufmann, MD Primary Cardiologist: Lorretta Harp MD FACP, [place], [place], [place]  HPI:  Kayla Romero is a 65 y.o.   mild to moderately overweight married Caucasian female mother of 4, grandmother of 7 grandchildren who takes care of her grandchildren at home and has not worked for the last 5 years. She was referred by Chevis Pretty, FNP for evaluation of palpitations.I last saw her in the office  08/16/2019.Her riskfactors include 40 to 60 pack years of tobacco abuse having quit 3 years ago. She does not have diabetes or hyperlipidemia. There is no family history for heart disease. She is never had a heart attack or stroke. She denies chest pain or shortness of breath. She had new onset palpitations approxi-1 month ago that occur on a daily basis more so in the morning in the evening. She has reduced her caffeine intake voluntarily not drinking decaf coffee. 24-hour Holter monitor was unrevealing, lab work showed TSH at the upper limit of normal and recent EKG performed in urgent care center showed unifocal PVCs.  A 2D echocardiogram performed 03/20/2019 was normal and an event monitor resulting on 04/03/2019 showed frequent PVCs occasionally in a trigeminal pattern. She has palpitations on a daily basis.   I had prescribed a low-dose beta-blocker which she did not take but in lieu of that cut out caffeine, got more sleep and has been taking her alprazolam on a as needed basis for anxiety.  The frequency and no stability of her palpitations have significantly improved.    Since I saw her a year ago her antianxiety medications were changed which seems to have improved her symptoms as well.  She is also taking over-the-counter magnesium which she attributes improvement in her palpitations 2.  She denies chest pain or shortness of breath.  She did have a negative GXT performed  02/07/2020.   Current Meds  Medication Sig  . magnesium oxide (MAG-OX) 400 MG tablet Take 100 mg by mouth 2 (two) times daily.     Allergies  Allergen Reactions  . No Known Allergies     Social History   Socioeconomic History  . Marital status: Married    Spouse name: Not on file  . Number of children: Not on file  . Years of education: Not on file  . Highest education level: Not on file  Occupational History  . Not on file  Tobacco Use  . Smoking status: Former Smoker    Types: Cigarettes    Quit date: 05/31/2015    Years since quitting: 5.1  . Smokeless tobacco: Never Used  . Tobacco comment: quit 2 weeks ago completely. had quit in 2012 but restarted  Substance and Sexual Activity  . Alcohol use: No  . Drug use: No  . Sexual activity: Not on file  Other Topics Concern  . Not on file  Social History Narrative  . Not on file   Social Determinants of Health   Financial Resource Strain: Not on file  Food Insecurity: Not on file  Transportation Needs: Not on file  Physical Activity: Not on file  Stress: Not on file  Social Connections: Not on file  Intimate Partner Violence: Not on file     Review of Systems: General: negative for chills, fever, night sweats or weight changes.  Cardiovascular: negative for chest pain, dyspnea on exertion, edema, orthopnea, palpitations, paroxysmal nocturnal dyspnea or shortness of breath  Dermatological: negative for rash Respiratory: negative for cough or wheezing Urologic: negative for hematuria Abdominal: negative for nausea, vomiting, diarrhea, bright red blood per rectum, melena, or hematemesis Neurologic: negative for visual changes, syncope, or dizziness All other systems reviewed and are otherwise negative except as noted above.    Blood pressure 130/78, pulse 63, height 5\' 4"  (1.626 m), weight 179 lb 12.8 oz (81.6 kg), last menstrual period 11/26/2010.  General appearance: alert and no distress Neck: no adenopathy,  no carotid bruit, no JVD, supple, symmetrical, trachea midline and thyroid not enlarged, symmetric, no tenderness/mass/nodules Lungs: clear to auscultation bilaterally Heart: regular rate and rhythm, S1, S2 normal, no murmur, click, rub or gallop Extremities: extremities normal, atraumatic, no cyanosis or edema Pulses: 2+ and symmetric Skin: Skin color, texture, turgor normal. No rashes or lesions Neurologic: Alert and oriented X 3, normal strength and tone. Normal symmetric reflexes. Normal coordination and gait  EKG sinus rhythm at 63 without ST or T wave changes.  I personally reviewed this EKG.  ASSESSMENT AND PLAN:   Palpitations History of palpitations found to be PVCs on monitoring improved on low-dose magnesium repletion.  She no longer feels palpitations.  She did have a normal GXT and a normal 2D echo.      Lorretta Harp MD FACP,FACC,FAHA, [facility] 07/31/2020 9:51 AM

## 2020-07-31 NOTE — Assessment & Plan Note (Signed)
History of palpitations found to be PVCs on monitoring improved on low-dose magnesium repletion.  She no longer feels palpitations.  She did have a normal GXT and a normal 2D echo.

## 2020-07-31 NOTE — Patient Instructions (Signed)

## 2020-08-15 ENCOUNTER — Encounter: Payer: Self-pay | Admitting: Family Medicine

## 2020-08-15 ENCOUNTER — Other Ambulatory Visit: Payer: Self-pay

## 2020-08-15 ENCOUNTER — Ambulatory Visit (INDEPENDENT_AMBULATORY_CARE_PROVIDER_SITE_OTHER): Payer: 59 | Admitting: Family Medicine

## 2020-08-15 VITALS — BP 137/76 | HR 58 | Temp 97.8°F | Resp 20 | Ht 64.0 in | Wt 178.0 lb

## 2020-08-15 DIAGNOSIS — F3341 Major depressive disorder, recurrent, in partial remission: Secondary | ICD-10-CM | POA: Diagnosis not present

## 2020-08-15 DIAGNOSIS — F419 Anxiety disorder, unspecified: Secondary | ICD-10-CM | POA: Diagnosis not present

## 2020-08-15 DIAGNOSIS — Z13220 Encounter for screening for lipoid disorders: Secondary | ICD-10-CM | POA: Diagnosis not present

## 2020-08-15 DIAGNOSIS — I493 Ventricular premature depolarization: Secondary | ICD-10-CM

## 2020-08-15 MED ORDER — LABETALOL HCL 100 MG PO TABS: 50.0000 mg | ORAL_TABLET | ORAL | 3 refills | Status: DC | PRN

## 2020-08-15 MED ORDER — FLUOXETINE HCL 10 MG PO CAPS: 10.0000 mg | ORAL_CAPSULE | Freq: Every day | ORAL | 3 refills | Status: DC

## 2020-08-15 MED ORDER — ALPRAZOLAM 0.25 MG PO TABS: 0.5000 mg | ORAL_TABLET | Freq: Every evening | ORAL | 1 refills | Status: DC | PRN

## 2020-08-15 NOTE — Progress Notes (Signed)
BP 137/76   Pulse (!) 58   Temp 97.8 F (36.6 C) (Temporal)   Resp 20   Ht _0  (1.626 m)   Wt 178 lb (80.7 kg)   LMP 11/26/2010   SpO2 98%   BMI 30.55 kg/m    Subjective:   Patient ID: Kayla Romero, female    DOB: [birth year], 65 y.o.   MRN: [id number]  HPI: Kayla Romero is a 66 y.o. female presenting on 08/15/2020 for Medical Management of Chronic Issues   HPI Depression and anxiety recheck Current rx-Prozac and the occasional alprazolam as needed # meds rx-60 Effectiveness of current meds-works at [facility] well Adverse reactions form meds-none  Pill count performed-No Last drug screen - 09/13/2019 ( high risk q16m moderate risk q680mlow risk yearly ) Urine drug screen today- Yes [place]  If yes were their any concerning findings? -None  No flowsheet data found.   Controlled substance contract signed on: 09/13/2019 [facility] 08/15/2020 02/03/2020 11/04/2019 09/23/2019 08/26/2019  Decreased Interest 0 0 0 0 3  Down, Depressed, Hopeless 0 0 0 0 0  PHQ - 2 Score 0 0 0 0 3  Altered sleeping 0 - 0 1 1  Tired, decreased energy 3 - _1 Change in appetite 0 - 0 1 3  Feeling bad or failure about yourself  0 - 0 0 0  Trouble concentrating 0 - 0 0 3  Moving slowly or fidgety/restless 0 - 0 3 0  Suicidal thoughts 0 - 0 0 0  PHQ-9 Score 3 - _2 Difficult doing work/chores Not difficult at all - Not difficult at all - Not difficult at all  Some recent data might be hidden    Will do blood work and screening  Relevant past medical, surgical, family and social history reviewed and updated as indicated. Interim medical history since our last visit reviewed. Allergies and medications reviewed and updated.  Review of Systems  Constitutional: Negative for chills and fever.  Eyes: Negative for visual disturbance.  Respiratory: Negative for chest tightness and shortness of breath.   Cardiovascular: Negative for chest pain and leg  swelling.  Genitourinary: Negative for difficulty urinating and dysuria.  Musculoskeletal: Negative for back pain and gait problem.  Skin: Negative for rash.  Neurological: Negative for light-headedness and headaches.  Psychiatric/Behavioral: Negative for agitation and behavioral problems.  All other systems reviewed and are negative.   Per HPI unless specifically indicated above   Allergies as of 08/15/2020      Reactions   No Known Allergies       Medication List       Accurate as of August 15, 2020 12:10 PM. If you have any questions, ask your nurse or doctor.        STOP taking these medications   traZODone 50 MG tablet Commonly known as: DESYREL Stopped by: JoFransisca Kaufmannettinger, MD     TAKE these medications   ALPRAZolam 0.25 MG tablet Commonly known as: XANAX Take 2 tablets (0.5 mg total) by mouth at bedtime as needed (panic attack).   FLUoxetine 10 MG capsule Commonly known as: PROZAC Take 1 capsule (10 mg total) by mouth daily.   fluticasone 50 MCG/ACT nasal spray Commonly known as: FLONASE SPRAY 2 SPRAYS INTO EACH NOSTRIL EVERY DAY   labetalol 100 MG tablet Commonly known as: NORMODYNE Take 0.5 tablets (50 mg total) by mouth as needed.   magnesium oxide  400 MG tablet Commonly known as: MAG-OX Take 100 mg by mouth 2 (two) times daily.   SYSTANE ULTRA OP Apply to eye.   Vitamin D3 25 MCG (1000 UT) Caps Take 2,000 Units by mouth daily.        Objective:   BP 137/76   Pulse (!) 58   Temp 97.8 F (36.6 C) (Temporal)   Resp 20   Ht _0  (1.626 m)   Wt 178 lb (80.7 kg)   LMP 11/26/2010   SpO2 98%   BMI 30.55 kg/m   Wt Readings from Last 3 Encounters:  08/15/20 178 lb (80.7 kg)  07/31/20 179 lb 12.8 oz (81.6 kg)  02/03/20 178 lb 8 oz (81 kg)    Physical Exam Vitals and nursing note reviewed.  Constitutional:      General: She is not in acute distress.    Appearance: She is well-developed and well-nourished. She is not diaphoretic.   Eyes:     Extraocular Movements: EOM normal.     Conjunctiva/sclera: Conjunctivae normal.  Cardiovascular:     Rate and Rhythm: Normal rate and regular rhythm.     Pulses: Intact distal pulses.     Heart sounds: Normal heart sounds. No murmur heard.   Pulmonary:     Effort: Pulmonary effort is normal. No respiratory distress.     Breath sounds: Normal breath sounds. No wheezing.  Musculoskeletal:        General: No tenderness or edema. Normal range of motion.  Skin:    General: Skin is warm and dry.     Findings: No rash.  Neurological:     Mental Status: She is alert and oriented to person, place, and time.     Coordination: Coordination normal.  Psychiatric:        Mood and Affect: Mood and affect normal.        Behavior: Behavior normal.       Assessment & Plan:   Problem List Items Addressed This Visit      Other   Anxiety   Relevant Medications   FLUoxetine (PROZAC) 10 MG capsule   ALPRAZolam (XANAX) 0.25 MG tablet   Other Relevant Orders   CBC with Differential/Platelet   CMP14+EGFR   ToxASSURE Select 13 (MW), Urine   Depression   Relevant Medications   FLUoxetine (PROZAC) 10 MG capsule   ALPRAZolam (XANAX) 0.25 MG tablet    Other Visit Diagnoses    Lipid screening    -  Primary   Relevant Orders   Lipid panel   PVC's (premature ventricular contractions)       Relevant Medications   labetalol (NORMODYNE) 100 MG tablet      Current medication Follow up plan: Return in about 6 months (around 02/12/2021), or if symptoms worsen or fail to improve, for depression and anxiety.  Counseling provided for all of the vaccine components Orders Placed This Encounter  Procedures  . CBC with Differential/Platelet  . CMP14+EGFR  . Lipid panel  . ToxASSURE Select 13 (MW), Urine    Caryl Pina, MD [facility] 08/15/2020, 12:10 PM

## 2020-08-16 LAB — CMP14+EGFR
ALT: 20 IU/L (ref 0–32)
AST: 20 IU/L (ref 0–40)
Albumin/Globulin Ratio: 1.6 (ref 1.2–2.2)
Albumin: 4.7 g/dL (ref 3.8–4.8)
Alkaline Phosphatase: 116 IU/L (ref 44–121)
BUN/Creatinine Ratio: 14 (ref 12–28)
BUN: 11 mg/dL (ref 8–27)
Bilirubin Total: 0.5 mg/dL (ref 0.0–1.2)
CO2: 24 mmol/L (ref 20–29)
Calcium: 9.4 mg/dL (ref 8.7–10.3)
Chloride: 102 mmol/L (ref 96–106)
Creatinine, Ser: 0.78 mg/dL (ref 0.57–1.00)
GFR calc Af Amer: 94 mL/min/{1.73_m2} (ref >59)
GFR calc non Af Amer: 81 mL/min/{1.73_m2} (ref >59)
Globulin, Total: 2.9 g/dL (ref 1.5–4.5)
Glucose: 102 mg/dL — ABNORMAL HIGH (ref 65–99)
Potassium: 4.5 mmol/L (ref 3.5–5.2)
Sodium: 142 mmol/L (ref 134–144)
Total Protein: 7.6 g/dL (ref 6.0–8.5)

## 2020-08-16 LAB — CBC WITH DIFFERENTIAL/PLATELET
Basophils Absolute: 0.1 10*3/uL (ref 0.0–0.2)
Basos: 1 %
EOS (ABSOLUTE): 0.1 10*3/uL (ref 0.0–0.4)
Eos: 1 %
Hematocrit: 42 % (ref 34.0–46.6)
Hemoglobin: 14 g/dL (ref 11.1–15.9)
Immature Grans (Abs): 0 10*3/uL (ref 0.0–0.1)
Immature Granulocytes: 0 %
Lymphocytes Absolute: 2.3 10*3/uL (ref 0.7–3.1)
Lymphs: 34 %
MCH: 30.5 pg (ref 26.6–33.0)
MCHC: 33.3 g/dL (ref 31.5–35.7)
MCV: 92 fL (ref 79–97)
Monocytes Absolute: 0.5 10*3/uL (ref 0.1–0.9)
Monocytes: 8 %
Neutrophils Absolute: 3.8 10*3/uL (ref 1.4–7.0)
Neutrophils: 56 %
Platelets: 309 10*3/uL (ref 150–450)
RBC: 4.59 x10E6/uL (ref 3.77–5.28)
RDW: 12.3 % (ref 11.7–15.4)
WBC: 6.8 10*3/uL (ref 3.4–10.8)

## 2020-08-16 LAB — LIPID PANEL
Chol/HDL Ratio: 3.8 ratio (ref 0.0–4.4)
Cholesterol, Total: 186 mg/dL (ref 100–199)
HDL: 49 mg/dL (ref >39)
LDL Chol Calc (NIH): 113 mg/dL — ABNORMAL HIGH (ref 0–99)
Triglycerides: 138 mg/dL (ref 0–149)
VLDL Cholesterol Cal: 24 mg/dL (ref 5–40)

## 2020-08-22 LAB — TOXASSURE SELECT 13 (MW), URINE

## 2021-02-12 ENCOUNTER — Other Ambulatory Visit: Payer: Self-pay

## 2021-02-12 ENCOUNTER — Ambulatory Visit (INDEPENDENT_AMBULATORY_CARE_PROVIDER_SITE_OTHER): Payer: 59 | Admitting: Family Medicine

## 2021-02-12 ENCOUNTER — Other Ambulatory Visit: Payer: Self-pay | Admitting: Family Medicine

## 2021-02-12 ENCOUNTER — Encounter: Payer: Self-pay | Admitting: Family Medicine

## 2021-02-12 VITALS — BP 121/78 | HR 70 | Ht 64.0 in | Wt 178.0 lb

## 2021-02-12 DIAGNOSIS — F419 Anxiety disorder, unspecified: Secondary | ICD-10-CM | POA: Diagnosis not present

## 2021-02-12 DIAGNOSIS — F3341 Major depressive disorder, recurrent, in partial remission: Secondary | ICD-10-CM

## 2021-02-12 DIAGNOSIS — Z23 Encounter for immunization: Secondary | ICD-10-CM

## 2021-02-12 DIAGNOSIS — E669 Obesity, unspecified: Secondary | ICD-10-CM

## 2021-02-12 DIAGNOSIS — R790 Abnormal level of blood mineral: Secondary | ICD-10-CM

## 2021-02-12 MED ORDER — FLUTICASONE PROPIONATE 50 MCG/ACT NA SUSP: NASAL | 1 refills | Status: DC

## 2021-02-12 NOTE — Progress Notes (Signed)
BP 121/78   Pulse 70   Ht '5\' 4"'  (1.626 m)   Wt 178 lb (80.7 kg)   LMP 11/26/2010   SpO2 95%   BMI 30.55 kg/m    Subjective:   Patient ID: Kayla Romero, female    DOB: 1956/03/16, 65 y.o.   MRN: [id number]  HPI: Kayla Romero is a 65 y.o. female presenting on 02/12/2021 for Medical Management of Chronic Issues, Hyperlipidemia, and Anxiety   HPI Anxiety and depression recheck. Patient is coming in for anxiety and depression recheck.  She feels like she doing very well on Prozac.  She did fill the Xanax once and February but she has not used that much on it and still has a bunch left and does not need a refill.  Patient denies any suicidal ideations or thoughts of hurting self. Depression screen [facility] 2/9 02/12/2021 08/15/2020 02/03/2020 11/04/2019 09/23/2019  Decreased Interest 0 0 0 0 0  Down, Depressed, Hopeless 0 0 0 0 0  PHQ - 2 Score 0 0 0 0 0  Altered sleeping 0 0 - 0 1  Tired, decreased energy 3 3 - 1 1  Change in appetite 0 0 - 0 1  Feeling bad or failure about yourself  0 0 - 0 0  Trouble concentrating 0 0 - 0 0  Moving slowly or fidgety/restless 0 0 - 0 3  Suicidal thoughts 0 0 - 0 0  PHQ-9 Score 3 3 - 1 6  Difficult doing work/chores - Not difficult at all - Not difficult at all -  Some recent data might be hidden    Discussed weight exercise and being more active, she is struggling right now because of her knee issues.  She possibly has a meniscus tear and is working with orthopedic illness.  Recommended try swimming  Patient has low magnesium and wants to recheck this today.  Relevant past medical, surgical, family and social history reviewed and updated as indicated. Interim medical history since our last visit reviewed. Allergies and medications reviewed and updated.  Review of Systems  Constitutional:  Negative for chills and fever.  Eyes:  Negative for visual disturbance.  Respiratory:  Negative for chest tightness and shortness of breath.   Cardiovascular:   Negative for chest pain and leg swelling.  Genitourinary:  Negative for difficulty urinating and dysuria.  Musculoskeletal:  Negative for back pain and gait problem.  Skin:  Negative for rash.  Neurological:  Negative for dizziness, light-headedness and headaches.  Psychiatric/Behavioral:  Negative for agitation and behavioral problems.   All other systems reviewed and are negative.  Per HPI unless specifically indicated above   Allergies as of 02/12/2021       Reactions   No Known Allergies         Medication List        Accurate as of February 12, 2021  9:39 AM. If you have any questions, ask your nurse or doctor.          ALPRAZolam 0.25 MG tablet Commonly known as: XANAX Take 2 tablets (0.5 mg total) by mouth at bedtime as needed (panic attack).   FLUoxetine 10 MG capsule Commonly known as: PROZAC Take 1 capsule (10 mg total) by mouth daily.   fluticasone 50 MCG/ACT nasal spray Commonly known as: FLONASE SPRAY 2 SPRAYS INTO EACH NOSTRIL EVERY DAY   labetalol 100 MG tablet Commonly known as: NORMODYNE Take 0.5 tablets (50 mg total) by mouth as needed.   magnesium  oxide 400 MG tablet Commonly known as: MAG-OX Take 100 mg by mouth 2 (two) times daily.   SYSTANE ULTRA OP Apply to eye.   Vitamin D3 25 MCG (1000 UT) Caps Take 2,000 Units by mouth daily.         Objective:   BP 121/78   Pulse 70   Ht '5\' 4"'  (1.626 m)   Wt 178 lb (80.7 kg)   LMP 11/26/2010   SpO2 95%   BMI 30.55 kg/m   Wt Readings from Last 3 Encounters:  02/12/21 178 lb (80.7 kg)  08/15/20 178 lb (80.7 kg)  07/31/20 179 lb 12.8 oz (81.6 kg)    Physical Exam Vitals and nursing note reviewed.  Constitutional:      General: She is not in acute distress.    Appearance: She is well-developed. She is not diaphoretic.  Eyes:     Conjunctiva/sclera: Conjunctivae normal.  Cardiovascular:     Rate and Rhythm: Normal rate and regular rhythm.     Heart sounds: Normal heart sounds. No  murmur heard. Pulmonary:     Effort: Pulmonary effort is normal. No respiratory distress.     Breath sounds: Normal breath sounds. No wheezing.  Musculoskeletal:        General: No swelling or tenderness. Normal range of motion.  Skin:    General: Skin is warm and dry.     Findings: No rash.  Neurological:     Mental Status: She is alert and oriented to person, place, and time.     Coordination: Coordination normal.  Psychiatric:        Behavior: Behavior normal.      Assessment & Plan:   Problem List Items Addressed This Visit       Other   Anxiety   Relevant Orders   CMP14+EGFR   CBC with Differential/Platelet   TSH   Depression - Primary   Relevant Orders   CMP14+EGFR   CBC with Differential/Platelet   TSH   Obesity (BMI 30.0-34.9)   Relevant Orders   Lipid panel   Other Visit Diagnoses     Need for shingles vaccine       Relevant Orders   Varicella-zoster vaccine IM (Shingrix)   Low magnesium level       Relevant Orders   Magnesium       Patient should still have 1 refill left, she refilled the first 1 in February and has not refilled another one since. Anxiety doing well, continue current medicines. Follow up plan: Return in about 6 months (around 08/15/2021) for Physical exam and recheck anxiety and depression.  Counseling provided for all of the vaccine components Orders Placed This Encounter  Procedures   Varicella-zoster vaccine IM (Shingrix)   CMP14+EGFR   Lipid panel   CBC with Differential/Platelet   TSH   Magnesium    Caryl Pina, MD [facility] 02/12/2021, 9:39 AM

## 2021-02-13 LAB — CMP14+EGFR
ALT: 21 IU/L (ref 0–32)
AST: 17 IU/L (ref 0–40)
Albumin/Globulin Ratio: 1.9 (ref 1.2–2.2)
Albumin: 4.5 g/dL (ref 3.8–4.8)
Alkaline Phosphatase: 111 IU/L (ref 44–121)
BUN/Creatinine Ratio: 22 (ref 12–28)
BUN: 15 mg/dL (ref 8–27)
Bilirubin Total: 0.4 mg/dL (ref 0.0–1.2)
CO2: 25 mmol/L (ref 20–29)
Calcium: 9.7 mg/dL (ref 8.7–10.3)
Chloride: 102 mmol/L (ref 96–106)
Creatinine, Ser: 0.69 mg/dL (ref 0.57–1.00)
Globulin, Total: 2.4 g/dL (ref 1.5–4.5)
Glucose: 61 mg/dL — ABNORMAL LOW (ref 65–99)
Potassium: 4.5 mmol/L (ref 3.5–5.2)
Sodium: 140 mmol/L (ref 134–144)
Total Protein: 6.9 g/dL (ref 6.0–8.5)
eGFR: 97 mL/min/{1.73_m2} (ref >59)

## 2021-02-13 LAB — LIPID PANEL
Chol/HDL Ratio: 3.8 ratio (ref 0.0–4.4)
Cholesterol, Total: 185 mg/dL (ref 100–199)
HDL: 49 mg/dL (ref >39)
LDL Chol Calc (NIH): 100 mg/dL — ABNORMAL HIGH (ref 0–99)
Triglycerides: 210 mg/dL — ABNORMAL HIGH (ref 0–149)
VLDL Cholesterol Cal: 36 mg/dL (ref 5–40)

## 2021-02-13 LAB — CBC WITH DIFFERENTIAL/PLATELET
Basophils Absolute: 0.1 10*3/uL (ref 0.0–0.2)
Basos: 1 %
EOS (ABSOLUTE): 0.1 10*3/uL (ref 0.0–0.4)
Eos: 2 %
Hematocrit: 39 % (ref 34.0–46.6)
Hemoglobin: 13.1 g/dL (ref 11.1–15.9)
Immature Grans (Abs): 0 10*3/uL (ref 0.0–0.1)
Immature Granulocytes: 0 %
Lymphocytes Absolute: 2.3 10*3/uL (ref 0.7–3.1)
Lymphs: 35 %
MCH: 30.7 pg (ref 26.6–33.0)
MCHC: 33.6 g/dL (ref 31.5–35.7)
MCV: 91 fL (ref 79–97)
Monocytes Absolute: 0.6 10*3/uL (ref 0.1–0.9)
Monocytes: 10 %
Neutrophils Absolute: 3.5 10*3/uL (ref 1.4–7.0)
Neutrophils: 52 %
Platelets: 295 10*3/uL (ref 150–450)
RBC: 4.27 x10E6/uL (ref 3.77–5.28)
RDW: 12.4 % (ref 11.7–15.4)
WBC: 6.6 10*3/uL (ref 3.4–10.8)

## 2021-02-13 LAB — MAGNESIUM: Magnesium: 2.2 mg/dL (ref 1.6–2.3)

## 2021-02-13 LAB — TSH: TSH: 6.1 u[IU]/mL — ABNORMAL HIGH (ref 0.450–4.500)

## 2021-02-14 ENCOUNTER — Other Ambulatory Visit: Payer: Self-pay

## 2021-02-14 DIAGNOSIS — E039 Hypothyroidism, unspecified: Secondary | ICD-10-CM

## 2021-02-25 ENCOUNTER — Other Ambulatory Visit: Payer: Self-pay

## 2021-02-25 ENCOUNTER — Ambulatory Visit (INDEPENDENT_AMBULATORY_CARE_PROVIDER_SITE_OTHER): Payer: 59 | Admitting: Family

## 2021-02-25 ENCOUNTER — Telehealth: Payer: Self-pay

## 2021-02-25 ENCOUNTER — Encounter: Payer: Self-pay | Admitting: Family

## 2021-02-25 VITALS — BP 110/69 | HR 67 | Temp 97.6°F | Ht 64.0 in | Wt 176.4 lb

## 2021-02-25 DIAGNOSIS — K115 Sialolithiasis: Secondary | ICD-10-CM

## 2021-02-25 MED ORDER — PREDNISONE 20 MG PO TABS: 40.0000 mg | ORAL_TABLET | Freq: Every day | ORAL | 0 refills | Status: AC

## 2021-02-25 NOTE — Progress Notes (Signed)
   Subjective:    Patient ID: Kayla Romero, female    DOB: 1955-09-10, 65 y.o.   MRN: [id number]  Chief Complaint  Patient presents with   Mass    On right side of neck came of Saturday. Runny nose and sneezing.    HPI PT presents to the office today with right sided lump on her neck that she noticed Saturday. It has slightly improved. She reports the area is tender and is about 7-8 out 10. Denies fever, sore throat, cough, or SOB.  Pain increases when eating or chewing.   She has has sneezing and nasal congestion.   No know exposures to strep or mono.   Review of Systems  All other systems reviewed and are negative.     Objective:   Physical Exam Vitals reviewed.  Constitutional:      General: She is not in acute distress.    Appearance: She is well-developed.  HENT:     Head: Normocephalic and atraumatic.      Comments: Tender, swollen area on right jaw Eyes:     Pupils: Pupils are equal, round, and reactive to light.  Neck:     Thyroid: No thyromegaly.  Cardiovascular:     Rate and Rhythm: Normal rate and regular rhythm.     Heart sounds: Normal heart sounds. No murmur heard. Pulmonary:     Effort: Pulmonary effort is normal. No respiratory distress.     Breath sounds: Normal breath sounds. No wheezing.  Abdominal:     General: Bowel sounds are normal. There is no distension.     Palpations: Abdomen is soft.     Tenderness: There is no abdominal tenderness.  Musculoskeletal:        General: No tenderness. Normal range of motion.     Cervical back: Normal range of motion and neck supple.  Skin:    General: Skin is warm and dry.  Neurological:     Mental Status: She is alert and oriented to person, place, and time.     Cranial Nerves: No cranial nerve deficit.     Deep Tendon Reflexes: Reflexes are normal and symmetric.  Psychiatric:        Behavior: Behavior normal.        Thought Content: Thought content normal.        Judgment: Judgment normal.       BP 110/69   Pulse 67   Temp 97.6 F (36.4 C) (Temporal)   Ht '5\' 4"'$  (1.626 m)   Wt 176 lb 6.4 oz (80 kg)   LMP 11/26/2010   SpO2 92%   BMI 30.28 kg/m       Assessment & Plan:  Kayla Romero comes in today with chief complaint of Mass (On right side of neck came of Saturday. Runny nose and sneezing.)   Diagnosis and orders addressed:  1. Sialolithiasis Suck on lemon hard candy Warm compresses  Motrin as needed  - predniSONE (DELTASONE) 20 MG tablet; Take 2 tablets (40 mg total) by mouth daily with breakfast for 5 days.  Dispense: 10 tablet; Refill: 0   Evelina Dun, FNP

## 2021-02-25 NOTE — Patient Instructions (Signed)
Salivary Stone  A salivary stone is a mineral deposit that builds up in the ducts that drain your salivary glands. Most salivary stones are made of calcium. When a stoneforms, saliva can back up into the gland and cause painful swelling. Your salivary glands are the glands that produce saliva. You have six major salivary glands. Each gland has a duct that carries saliva into your mouth. Saliva keeps your mouth moist and breaks down the food that you eat. It alsohelps prevent tooth decay. Two salivary glands are located just in front of your ears (parotid). The ducts for these glands open up inside your cheeks, near your back teeth. You also have two glands under your tongue (sublingual) and two glands under your jaw (submandibular). The ducts for these glands open under your tongue. A stone can form in any salivary gland. The most common place for a salivary stone to develop is in asubmandibular salivary gland. What are the causes? Salivary stones may be caused by any condition that reduces the flow of saliva.It is not known why some people form stones. What increases the risk? You are more likely to develop this condition if: You are female. You do not drink enough water. You smoke. You have any of the following: High blood pressure. Gout. Diabetes. What are the signs or symptoms? The main sign of a salivary gland stone is sudden swelling of a salivary gland when eating. This usually happens under the jaw on one side. Other signs and symptoms may include: Swelling of the cheek or under the tongue when eating. Pain in the swollen area. Trouble chewing or swallowing. Swelling that goes down after eating. How is this diagnosed? This condition may be diagnosed based on: Your signs and symptoms. A physical exam. In many cases, your health care provider will be able to feel the stone in a duct inside your mouth. Imaging studies, such as: X-rays. Ultrasound. CT scan. MRI. You may need to see  an ear, nose, and throat specialist (ENT or otolaryngologist) for diagnosis and treatment. How is this treated? Treatment for this condition depends on the size of the stone. A small stone that is not causing symptoms may be treated with home care. For a stone that is large enough to cause symptoms, the treatment options may include: Probing and widening of the duct to allow the stone to pass. Inserting a thin, flexible scope (endoscope) into the duct to locate and remove the stone. Breaking up the stone with sound waves. Removing the entire salivary gland. Follow these instructions at home:  To relieve discomfort Follow these instructions every few hours: Suck on a lemon candy to stimulate the flow of saliva. Put a warm compress over the gland. Gently massage the gland. General instructions Drink enough fluid to keep your urine pale yellow. Do not use any products that contain nicotine or tobacco, such as cigarettes and e-cigarettes. If you need help quitting, ask your health care provider. Keep all follow-up visits as told by your health care provider. This is important. Contact a health care provider if: You have pain and swelling in your face, jaw, or mouth after eating. You have persistent swelling in any of these places: In front of your ear. Under your jaw. Inside your mouth. Get help right away if: You have pain and swelling in your face, jaw, or mouth, and this is getting worse. Your pain and swelling make it hard to swallow or breathe. Summary A salivary stone is a mineral deposit that  builds up in the ducts that drain your salivary glands. When a stone forms, saliva can back up into the gland and cause painful swelling. Salivary stones may be caused by any condition that reduces the flow of saliva. Treatment for this condition depends on the size of the stone. This information is not intended to replace advice given to you by your health care provider. Make sure you  discuss any questions you have with your healthcare provider. Document Revised: 07/27/2017 Document Reviewed: 07/27/2017 Elsevier Patient Education  2022 [employer].

## 2021-02-25 NOTE — Telephone Encounter (Signed)
    Patient Name: Kayla Romero  DOB: Aug 16, 1955 MRN: [id number]  Primary Cardiologist: Quay Burow, MD  Chart reviewed as part of pre-operative protocol coverage. Patient was last seen by Dr. Gwenlyn Found in 07/2020 at which time she was doing well from a cardiac standpoint. Patient was contacted today for further pre-op evaluation and reported doing well since last visit. She notes very rare brief palpitations with her known PVC but no significant or sustained palpitations. No chest pain, shortness of breath, lightheadedness, dizziness, or syncope. Activity is limited some by knee pain but still able to complete >4.0 METS without any anginal symptoms. Per Revised, Cardiac Risk Index, considered very low risk with 0.4% chance of adverse cardiac events perioperatively. Given past medical history and time since last visit, based on ACC/AHA guidelines, Kayla Romero would be at acceptable risk for the planned procedure without further cardiovascular testing.   The patient was advised that if she develops new symptoms prior to surgery to contact our office to arrange for a follow-up visit, and she verbalized understanding.  I will route this recommendation to the requesting party via Epic fax function and remove from pre-op pool.  Please call with questions.  Darreld Mclean, PA-C 02/25/2021, 2:14 PM

## 2021-02-25 NOTE — Telephone Encounter (Signed)
   [facility] Pre-operative Risk Assessment    Patient Name: Kayla Romero  DOB: October 02, 1955 MRN: [id number]  HEARTCARE STAFF:  - IMPORTANT!!!!!! Under Visit Info/Reason for Call, type in Other and utilize the format Clearance MM/DD/YY or Clearance TBD. Do not use dashes or single digits. - Please review there is not already an duplicate clearance open for this procedure. - If request is for dental extraction, please clarify the # of teeth to be extracted. - If the patient is currently at the dentist's office, call Pre-Op Callback Staff (MA/nurse) to input urgent request.  - If the patient is not currently in the dentist office, please route to the Pre-Op pool.  Request for surgical clearance:  What type of surgery is being performed? Left knee Arthroscopic partial medial   When is this surgery scheduled? 04/26/2021  What type of clearance is required (medical clearance vs. Pharmacy clearance to hold med vs. Both)? Medical   Are there any medications that need to be held prior to surgery and how long? None   Practice name and name of physician performing surgery? [facility]  What is the office phone number? [phone]   7.   What is the office fax number? [phone]  8.   Anesthesia type (None, local, MAC, general) ? Unknown    Kayla Romero 02/25/2021, 1:24 PM  _________________________________________________________________   (provider comments below)

## 2021-04-03 ENCOUNTER — Other Ambulatory Visit: Payer: Self-pay

## 2021-04-03 ENCOUNTER — Ambulatory Visit (INDEPENDENT_AMBULATORY_CARE_PROVIDER_SITE_OTHER): Payer: 59

## 2021-04-03 DIAGNOSIS — Z23 Encounter for immunization: Secondary | ICD-10-CM | POA: Diagnosis not present

## 2021-04-05 NOTE — Patient Instructions (Signed)
DUE TO COVID-19 ONLY ONE VISITOR IS ALLOWED TO COME WITH YOU AND STAY IN THE WAITING ROOM ONLY DURING PRE OP AND PROCEDURE DAY OF SURGERY IF YOU ARE GOING HOME AFTER SURGERY. IF YOU ARE SPENDING THE NIGHT 2 PEOPLE MAY VISIT WITH YOU IN YOUR PRIVATE ROOM AFTER SURGERY UNTIL VISITING  HOURS ARE OVER AT 800 PM AND THE 2 VISITORS CANNOT SPEND THE NIGHT.                  Kayla Romero     Your procedure is scheduled on: 04/26/21   Report to [facility] Main  Entrance   Report to short stay at 5:15 AM     Call this number if you have problems the morning of surgery [phone]    No food after midnight.    You may have clear liquid until 4:30 AM.    At 4:00 AM drink pre surgery drink.   Nothing by mouth after 4:30 AM.   CLEAR LIQUID DIET   Foods Allowed                                                                     Foods Excluded                                                                                                   liquids that you cannot  Plain Jell-O any favor except red or purple                                           see through such as: Fruit ices (not with fruit pulp)                                     milk, soups, orange juice  Iced Popsicles                                    All solid food Carbonated beverages, regular and diet                                    Cranberry, grape and apple juices Sports drinks like Gatorade Lightly seasoned clear broth or consume(fat free) Sugar     BRUSH YOUR TEETH MORNING OF SURGERY AND RINSE YOUR MOUTH OUT, NO CHEWING GUM CANDY OR MINTS.     Take these medicines the morning of surgery with A SIP OF WATER: Prozac, Flonase  You may not have any metal on your body including hair pins and              piercings  Do not wear jewelry, make-up, lotions, powders or perfumes, deodorant             Do not wear nail polish on your fingernails.  Do not shave  48 hours prior to  surgery.                 Do not bring valuables to the hospital. [place].  Contacts, dentures or bridgework may not be worn into surgery.     Patients discharged the day of surgery will not be allowed to drive home.  IF YOU ARE HAVING SURGERY AND GOING HOME THE SAME DAY, YOU MUST HAVE AN ADULT TO DRIVE YOU HOME AND BE WITH YOU FOR 24 HOURS. YOU MAY GO HOME BY TAXI OR UBER OR ORTHERWISE, BUT AN ADULT MUST ACCOMPANY YOU HOME AND STAY WITH YOU FOR 24 HOURS.  Name and phone number of your driver:  Special Instructions: N/A              Please read over the following fact sheets you were given: _____________________________________________________________________             [facility] - Preparing for Surgery Before surgery, you can play an important role.  Because skin is not sterile, your skin needs to be as free of germs as possible.  You can reduce the number of germs on your skin by washing with CHG (chlorahexidine gluconate) soap before surgery.  CHG is an antiseptic cleaner which kills germs and bonds with the skin to continue killing germs even after washing. Please DO NOT use if you have an allergy to CHG or antibacterial soaps.  If your skin becomes reddened/irritated stop using the CHG and inform your nurse when you arrive at Short Stay. Do not shave (including legs and underarms) for at least 48 hours prior to the first CHG shower.    Please follow these instructions carefully:  1.  Shower with CHG Soap the night before surgery and the  morning of Surgery.  2.  If you choose to wash your hair, wash your hair first as usual with your  normal  shampoo.  3.  After you shampoo, rinse your hair and body thoroughly to remove the  shampoo.                            4.  Use CHG as you would any other liquid soap.  You can apply chg directly  to the skin and wash                       Gently with a scrungie or clean washcloth.  5.   Apply the CHG Soap to your body ONLY FROM THE NECK DOWN.   Do not use on face/ open                           Wound or open sores. Avoid contact with eyes, ears mouth and genitals (private parts).                       Wash face,  Development worker, international aid (private parts)  with your normal soap.             6.  Wash thoroughly, paying special attention to the area where your surgery  will be performed.  7.  Thoroughly rinse your body with warm water from the neck down.  8.  DO NOT shower/wash with your normal soap after using and rinsing off  the CHG Soap.                9.  Pat yourself dry with a clean towel.            10.  Wear clean pajamas.            11.  Place clean sheets on your bed the night of your first shower and do not  sleep with pets. Day of Surgery : Do not apply any lotions/deodorants the morning of surgery.  Please wear clean clothes to the hospital/surgery center.  FAILURE TO FOLLOW THESE INSTRUCTIONS MAY RESULT IN THE CANCELLATION OF YOUR SURGERY PATIENT SIGNATURE_________________________________  NURSE SIGNATURE__________________________________  ________________________________________________________________________   Kayla Romero  An incentive spirometer is a tool that can help keep your lungs clear and active. This tool measures how well you are filling your lungs with each breath. Taking long deep breaths may help reverse or decrease the chance of developing breathing (pulmonary) problems (especially infection) following: A long period of time when you are unable to move or be active. BEFORE THE PROCEDURE  If the spirometer includes an indicator to show your best effort, your nurse or respiratory therapist will set it to a desired goal. If possible, sit up straight or lean slightly forward. Try not to slouch. Hold the incentive spirometer in an upright position. INSTRUCTIONS FOR USE  Sit on the edge of your bed if possible, or sit up as far as you can in bed or on a  chair. Hold the incentive spirometer in an upright position. Breathe out normally. Place the mouthpiece in your mouth and seal your lips tightly around it. Breathe in slowly and as deeply as possible, raising the piston or the ball toward the top of the column. Hold your breath for 3-5 seconds or for as long as possible. Allow the piston or ball to fall to the bottom of the column. Remove the mouthpiece from your mouth and breathe out normally. Rest for a few seconds and repeat Steps 1 through 7 at least 10 times every 1-2 hours when you are awake. Take your time and take a few normal breaths between deep breaths. The spirometer may include an indicator to show your best effort. Use the indicator as a goal to work toward during each repetition. After each set of 10 deep breaths, practice coughing to be sure your lungs are clear. If you have an incision (the cut made at the time of surgery), support your incision when coughing by placing a pillow or rolled up towels firmly against it. Once you are able to get out of bed, walk around indoors and cough well. You may stop using the incentive spirometer when instructed by your caregiver.  RISKS AND COMPLICATIONS Take your time so you do not get dizzy or light-headed. If you are in pain, you may need to take or ask for pain medication before doing incentive spirometry. It is harder to take a deep breath if you are having pain. AFTER USE Rest and breathe slowly and easily. It can be helpful to keep track of a log of your progress. Your  caregiver can provide you with a simple table to help with this. If you are using the spirometer at home, follow these instructions: [place] IF:  You are having difficultly using the spirometer. You have trouble using the spirometer as often as instructed. Your pain medication is not giving enough relief while using the spirometer. You develop fever of 100.5 F (38.1 C) or higher. SEEK IMMEDIATE MEDICAL CARE  IF:  You cough up bloody sputum that had not been present before. You develop fever of 102 F (38.9 C) or greater. You develop worsening pain at or near the incision site. MAKE SURE YOU:  Understand these instructions. Will watch your condition. Will get help right away if you are not doing well or get worse. Document Released: 10/27/2006 Document Revised: 09/08/2011 Document Reviewed: 12/28/2006 [facility] Patient Information 2014 [place], [place].   ________________________________________________________________________

## 2021-04-08 ENCOUNTER — Other Ambulatory Visit: Payer: Self-pay

## 2021-04-08 ENCOUNTER — Encounter (HOSPITAL_COMMUNITY): Payer: Self-pay

## 2021-04-08 ENCOUNTER — Encounter (HOSPITAL_COMMUNITY)
Admission: RE | Admit: 2021-04-08 | Discharge: 2021-04-08 | Disposition: A | Discharge status: Home / Self Care | Payer: 59 | Source: Ambulatory Visit | Attending: Orthopedic Surgery | Admitting: Orthopedic Surgery

## 2021-04-08 DIAGNOSIS — Z01812 Encounter for preprocedural laboratory examination: Secondary | ICD-10-CM | POA: Insufficient documentation

## 2021-04-08 LAB — BASIC METABOLIC PANEL
Anion gap: 6 (ref 5–15)
BUN: 20 mg/dL (ref 8–23)
CO2: 26 mmol/L (ref 22–32)
Calcium: 9.3 mg/dL (ref 8.9–10.3)
Chloride: 106 mmol/L (ref 98–111)
Creatinine, Ser: 0.64 mg/dL (ref 0.44–1.00)
GFR, Estimated: >60 mL/min (ref >60)
Glucose, Bld: 68 mg/dL — ABNORMAL LOW (ref 70–99)
Potassium: 3.9 mmol/L (ref 3.5–5.1)
Sodium: 138 mmol/L (ref 135–145)

## 2021-04-08 LAB — CBC
HCT: 40.6 % (ref 36.0–46.0)
Hemoglobin: 13.5 g/dL (ref 12.0–15.0)
MCH: 30.8 pg (ref 26.0–34.0)
MCHC: 33.3 g/dL (ref 30.0–36.0)
MCV: 92.5 fL (ref 80.0–100.0)
Platelets: 301 10*3/uL (ref 150–400)
RBC: 4.39 MIL/uL (ref 3.87–5.11)
RDW: 12.2 % (ref 11.5–15.5)
WBC: 6.3 10*3/uL (ref 4.0–10.5)
nRBC: 0 % (ref 0.0–0.2)

## 2021-04-08 NOTE — Progress Notes (Signed)
COVID test- NA  PCP - Dr. Lenna Sciara. [place] Cardiologist - Dr. Adora Fridge  Chest x-ray - no EKG - 07/31/20-epic Stress Test - 02/07/20-epic ECHO - 03/28/19-epic Cardiac Cath - no Pacemaker/ICD device last checked:NA  Sleep Study - no CPAP -   Fasting Blood Sugar - NA Checks Blood Sugar _____ times a day  Blood Thinner Instructions:NA Aspirin Instructions: Last Dose:  Anesthesia review: yes  Patient denies shortness of breath, fever, cough and chest pain at PAT appointment Pt reports no SOB with any activities. She has lost weight. She was having PVCs  but now takes magnesium and hardly ever has a PVC. She sees Dr. Gwenlyn Found clearance in epic.  Patient verbalized understanding of instructions that were given to them at the PAT appointment. Patient was also instructed that they will need to review over the PAT instructions again at home before surgery. yes

## 2021-04-15 ENCOUNTER — Other Ambulatory Visit (HOSPITAL_COMMUNITY): Payer: 59

## 2021-04-23 ENCOUNTER — Other Ambulatory Visit: Payer: 59

## 2021-04-23 ENCOUNTER — Other Ambulatory Visit: Payer: Self-pay

## 2021-04-23 DIAGNOSIS — E039 Hypothyroidism, unspecified: Secondary | ICD-10-CM

## 2021-04-24 LAB — TSH: TSH: 5.68 u[IU]/mL — ABNORMAL HIGH (ref 0.450–4.500)

## 2021-04-25 ENCOUNTER — Other Ambulatory Visit: Payer: Self-pay

## 2021-04-25 DIAGNOSIS — R7989 Other specified abnormal findings of blood chemistry: Secondary | ICD-10-CM

## 2021-04-25 MED ORDER — LEVOTHYROXINE SODIUM 25 MCG PO TABS: 25.0000 ug | ORAL_TABLET | Freq: Every day | ORAL | 1 refills | Status: DC

## 2021-05-03 ENCOUNTER — Ambulatory Visit: Payer: 59 | Admitting: Physical Therapy

## 2021-05-27 ENCOUNTER — Ambulatory Visit (HOSPITAL_BASED_OUTPATIENT_CLINIC_OR_DEPARTMENT_OTHER): Admission: RE | Admit: 2021-05-27 | Payer: 59 | Source: Ambulatory Visit | Admitting: Orthopedic Surgery

## 2021-05-27 ENCOUNTER — Encounter (HOSPITAL_BASED_OUTPATIENT_CLINIC_OR_DEPARTMENT_OTHER): Admission: RE | Payer: Self-pay | Source: Ambulatory Visit

## 2021-05-27 SURGERY — ARTHROSCOPY, KNEE, WITH MEDIAL MENISCECTOMY
Anesthesia: Choice | Site: Knee | Laterality: Left

## 2021-07-11 ENCOUNTER — Ambulatory Visit (INDEPENDENT_AMBULATORY_CARE_PROVIDER_SITE_OTHER): Payer: 59

## 2021-07-11 ENCOUNTER — Other Ambulatory Visit: Payer: 59

## 2021-07-11 DIAGNOSIS — Z23 Encounter for immunization: Secondary | ICD-10-CM | POA: Diagnosis not present

## 2021-07-11 DIAGNOSIS — R7989 Other specified abnormal findings of blood chemistry: Secondary | ICD-10-CM

## 2021-07-12 LAB — TSH: TSH: 3.96 u[IU]/mL (ref 0.450–4.500)

## 2021-07-22 ENCOUNTER — Other Ambulatory Visit: Payer: Self-pay | Admitting: Family Medicine

## 2021-07-22 DIAGNOSIS — F3341 Major depressive disorder, recurrent, in partial remission: Secondary | ICD-10-CM

## 2021-07-22 DIAGNOSIS — F419 Anxiety disorder, unspecified: Secondary | ICD-10-CM

## 2021-08-07 ENCOUNTER — Other Ambulatory Visit: Payer: Self-pay | Admitting: Family Medicine

## 2021-08-15 ENCOUNTER — Encounter: Payer: Self-pay | Admitting: Family Medicine

## 2021-08-15 ENCOUNTER — Telehealth: Payer: Self-pay | Admitting: Family Medicine

## 2021-08-15 ENCOUNTER — Ambulatory Visit (INDEPENDENT_AMBULATORY_CARE_PROVIDER_SITE_OTHER): Payer: 59 | Admitting: Family Medicine

## 2021-08-15 VITALS — BP 142/83 | HR 67 | Temp 97.7°F | Ht 64.0 in | Wt 181.4 lb

## 2021-08-15 DIAGNOSIS — F419 Anxiety disorder, unspecified: Secondary | ICD-10-CM | POA: Diagnosis not present

## 2021-08-15 DIAGNOSIS — E039 Hypothyroidism, unspecified: Secondary | ICD-10-CM | POA: Diagnosis not present

## 2021-08-15 DIAGNOSIS — Z0001 Encounter for general adult medical examination with abnormal findings: Secondary | ICD-10-CM

## 2021-08-15 DIAGNOSIS — R69 Illness, unspecified: Secondary | ICD-10-CM | POA: Diagnosis not present

## 2021-08-15 DIAGNOSIS — Z79899 Other long term (current) drug therapy: Secondary | ICD-10-CM | POA: Diagnosis not present

## 2021-08-15 DIAGNOSIS — E559 Vitamin D deficiency, unspecified: Secondary | ICD-10-CM

## 2021-08-15 DIAGNOSIS — Z0000 Encounter for general adult medical examination without abnormal findings: Secondary | ICD-10-CM | POA: Diagnosis not present

## 2021-08-15 DIAGNOSIS — F3341 Major depressive disorder, recurrent, in partial remission: Secondary | ICD-10-CM | POA: Diagnosis not present

## 2021-08-15 MED ORDER — ALPRAZOLAM 0.25 MG PO TABS
0.5000 mg | ORAL_TABLET | Freq: Every evening | ORAL | 1 refills | Status: DC | PRN
Start: 2021-08-15 — End: 2022-02-11

## 2021-08-15 MED ORDER — FLUOXETINE HCL 10 MG PO CAPS: 10.0000 mg | ORAL_CAPSULE | Freq: Every day | ORAL | 3 refills | Status: DC

## 2021-08-15 MED ORDER — ALPRAZOLAM 0.25 MG PO TABS: 0.5000 mg | ORAL_TABLET | Freq: Every evening | ORAL | 1 refills | Status: DC | PRN

## 2021-08-15 NOTE — Telephone Encounter (Signed)
°  Prescription Request  08/15/2021  Is this a "Controlled Substance" medicine? no  Have you seen your PCP in the last 2 weeks? Yes, today  If YES, route message to pool  -  If NO, patient needs to be scheduled for appointment.  What is the name of the medication or equipment? Levothyroxine  Have you contacted your pharmacy to request a refill? no   Which pharmacy would you like this sent to? [facility]   Patient notified that their request is being sent to the clinical staff for review and that they should receive a response within 2 business days.    Dettinger's pt.  Pt seen Dettinger today & she came back in lobby to let us know that she needs this med.

## 2021-08-15 NOTE — Telephone Encounter (Signed)
NA  Medication was sent in- let patient know when she calls back

## 2021-08-15 NOTE — Addendum Note (Signed)
Addended by: Caryl Pina on: 08/15/2021 10:41 AM   Modules accepted: Orders

## 2021-08-15 NOTE — Progress Notes (Signed)
BP (!) 142/83    Pulse 67    Temp 97.7 F (36.5 C) (Oral)    Ht 5' 4" (1.626 m)    Wt 181 lb 6.4 oz (82.3 kg)    LMP 11/26/2010    SpO2 96%    BMI 31.14 kg/m    Subjective:   Patient ID: Kayla Romero, female    DOB: 1956/02/11, 66 y.o.   MRN: [id number]  HPI: Kayla Romero is a 66 y.o. female presenting on 08/15/2021 for Medication Management (Cpe no pap)   HPI Physical exam and recheck of chronic medical issues. Patient denies any chest pain, shortness of breath, headaches or vision issues, abdominal complaints, diarrhea, nausea, vomiting, or joint issues.  Anxiety and depression recheck Current rx-Xanax 0.25 mg twice daily as needed for panic attacks.  She only uses very infrequently.  And takes Prozac as well # meds rx-60 Effectiveness of current meds-works well, only uses infrequently Adverse reactions form meds-none  Pill count performed-No Last drug screen -08/21/2020 ( high risk q56m moderate risk q642mlow risk yearly ) Urine drug screen today- Yes Was the [place]yes  If yes were their any concerning findings? -None  No flowsheet data found.   Controlled substance contract signed on: Today  Hypothyroidism recheck Patient is coming in for thyroid recheck today as well. They deny any issues with hair changes or heat or cold problems or diarrhea or constipation. They deny any chest pain or palpitations. They are currently on levothyroxine 25 micrograms.  She is complaining of some weight gain and fatigue and does not know if that is due to thyroid.  We did adjust her dose last time she was seen near the end of last year.  Relevant past medical, surgical, family and social history reviewed and updated as indicated. Interim medical history since our last visit reviewed. Allergies and medications reviewed and updated.  Review of Systems  Constitutional:  Negative for chills and fever.  Eyes:  Negative for visual disturbance.  Respiratory:  Negative for chest  tightness and shortness of breath.   Cardiovascular:  Negative for chest pain and leg swelling.  Musculoskeletal:  Negative for back pain and gait problem.  Skin:  Negative for rash.  Neurological:  Negative for dizziness, light-headedness and headaches.  Psychiatric/Behavioral:  Negative for agitation and behavioral problems.   All other systems reviewed and are negative.  Per HPI unless specifically indicated above   Allergies as of 08/15/2021   No Known Allergies      Medication List        Accurate as of August 15, 2021 10:40 AM. If you have any questions, ask your nurse or doctor.          STOP taking these medications    labetalol 100 MG tablet Commonly known as: NORMODYNE Stopped by: JoFransisca Kaufmannettinger, MD       TAKE these medications    ALPRAZolam 0.25 MG tablet Commonly known as: XANAX Take 2 tablets (0.5 mg total) by mouth at bedtime as needed (panic attack). What changed: when to take this   FLUoxetine 10 MG capsule Commonly known as: PROZAC Take 1 capsule (10 mg total) by mouth daily. What changed: how much to take Changed by: JoFransisca Kaufmannettinger, MD   fluticasone 50 MCG/ACT nasal spray Commonly known as: FLONASE SPRAY 2 SPRAYS INTO EACH NOSTRIL EVERY DAY What changed:  how much to take how to take this when to take this reasons to take  this   levothyroxine 25 MCG tablet Commonly known as: SYNTHROID TAKE 1 TABLET BY MOUTH EVERY DAY   Magnesium 100 MG Tabs Take 100 mg by mouth in the morning and at bedtime.   SYSTANE ULTRA OP Place 1 drop into both eyes every evening.   Vitamin D3 50 MCG (2000 UT) Tabs Take 2,000 Units by mouth in the morning.         Objective:   BP (!) 142/83    Pulse 67    Temp 97.7 F (36.5 C) (Oral)    Ht 5' 4" (1.626 m)    Wt 181 lb 6.4 oz (82.3 kg)    LMP 11/26/2010    SpO2 96%    BMI 31.14 kg/m   Wt Readings from Last 3 Encounters:  08/15/21 181 lb 6.4 oz (82.3 kg)  04/08/21 175 lb 6 oz (79.5 kg)   02/25/21 176 lb 6.4 oz (80 kg)    Physical Exam Vitals and nursing note reviewed.  Constitutional:      General: She is not in acute distress.    Appearance: Normal appearance. She is well-developed. She is obese. She is not diaphoretic.  HENT:     Right Ear: Tympanic membrane and ear canal normal.     Left Ear: Tympanic membrane and ear canal normal.     Nose: Nose normal.     Mouth/Throat:     Mouth: Mucous membranes are moist.     Pharynx: Oropharynx is clear. No oropharyngeal exudate.  Eyes:     Conjunctiva/sclera: Conjunctivae normal.  Cardiovascular:     Rate and Rhythm: Normal rate and regular rhythm.     Heart sounds: Normal heart sounds. No murmur heard. Pulmonary:     Effort: Pulmonary effort is normal. No respiratory distress.     Breath sounds: Normal breath sounds. No wheezing.  Abdominal:     General: Abdomen is flat. Bowel sounds are normal. There is no distension.     Tenderness: There is no abdominal tenderness. There is no guarding or rebound.  Musculoskeletal:        General: No swelling or tenderness. Normal range of motion.  Skin:    General: Skin is warm and dry.     Findings: No rash.  Neurological:     Mental Status: She is alert and oriented to person, place, and time.     Coordination: Coordination normal.  Psychiatric:        Behavior: Behavior normal.      Assessment & Plan:   Problem List Items Addressed This Visit       Other   Anxiety   Relevant Medications   ALPRAZolam (XANAX) 0.25 MG tablet   FLUoxetine (PROZAC) 10 MG capsule   Other Relevant Orders   CBC with Differential/Platelet   Depression   Relevant Medications   ALPRAZolam (XANAX) 0.25 MG tablet   FLUoxetine (PROZAC) 10 MG capsule   Vitamin D deficiency   Relevant Orders   VITAMIN D 25 Hydroxy (Vit-D Deficiency, Fractures)   Other Visit Diagnoses     Physical exam    -  Primary   Relevant Orders   CBC with Differential/Platelet   CMP14+EGFR   Lipid panel   TSH    Controlled substance agreement signed       Relevant Orders   ToxASSURE Select 13 (MW), Urine   Magnesium   Hypomagnesemia       Relevant Orders   CBC with Differential/Platelet   CMP14+EGFR   Acquired  hypothyroidism       Relevant Orders   TSH     Continue current medicine for now.  We will check blood work and see how she is doing.  Sent refills for the patient.  Follow up plan: Return in about 6 months (around 02/12/2022), or if symptoms worsen or fail to improve, for hypothyroidism.  Counseling provided for all of the vaccine components Orders Placed This Encounter  Procedures   ToxASSURE Select 13 (MW), Urine   CBC with Differential/Platelet   CMP14+EGFR   Lipid panel   TSH   Magnesium   VITAMIN D 25 Hydroxy (Vit-D Deficiency, Fractures)    Caryl Pina, MD [facility] 08/15/2021, 10:40 AM

## 2021-08-16 LAB — CMP14+EGFR
ALT: 31 IU/L (ref 0–32)
AST: 25 IU/L (ref 0–40)
Albumin/Globulin Ratio: 1.6 (ref 1.2–2.2)
Albumin: 4.4 g/dL (ref 3.8–4.8)
Alkaline Phosphatase: 137 IU/L — ABNORMAL HIGH (ref 44–121)
BUN/Creatinine Ratio: 15 (ref 12–28)
BUN: 12 mg/dL (ref 8–27)
Bilirubin Total: 0.4 mg/dL (ref 0.0–1.2)
CO2: 26 mmol/L (ref 20–29)
Calcium: 9.6 mg/dL (ref 8.7–10.3)
Chloride: 100 mmol/L (ref 96–106)
Creatinine, Ser: 0.79 mg/dL (ref 0.57–1.00)
Globulin, Total: 2.8 g/dL (ref 1.5–4.5)
Glucose: 96 mg/dL (ref 70–99)
Potassium: 4.5 mmol/L (ref 3.5–5.2)
Sodium: 140 mmol/L (ref 134–144)
Total Protein: 7.2 g/dL (ref 6.0–8.5)
eGFR: 83 mL/min/{1.73_m2} (ref >59)

## 2021-08-16 LAB — CBC WITH DIFFERENTIAL/PLATELET
Basophils Absolute: 0.1 10*3/uL (ref 0.0–0.2)
Basos: 1 %
EOS (ABSOLUTE): 0.1 10*3/uL (ref 0.0–0.4)
Eos: 1 %
Hematocrit: 40.6 % (ref 34.0–46.6)
Hemoglobin: 13.5 g/dL (ref 11.1–15.9)
Immature Grans (Abs): 0 10*3/uL (ref 0.0–0.1)
Immature Granulocytes: 0 %
Lymphocytes Absolute: 2.2 10*3/uL (ref 0.7–3.1)
Lymphs: 32 %
MCH: 29.8 pg (ref 26.6–33.0)
MCHC: 33.3 g/dL (ref 31.5–35.7)
MCV: 90 fL (ref 79–97)
Monocytes Absolute: 0.6 10*3/uL (ref 0.1–0.9)
Monocytes: 8 %
Neutrophils Absolute: 4.1 10*3/uL (ref 1.4–7.0)
Neutrophils: 58 %
Platelets: 301 10*3/uL (ref 150–450)
RBC: 4.53 x10E6/uL (ref 3.77–5.28)
RDW: 12.5 % (ref 11.7–15.4)
WBC: 7 10*3/uL (ref 3.4–10.8)

## 2021-08-16 LAB — LIPID PANEL
Chol/HDL Ratio: 3.7 ratio (ref 0.0–4.4)
Cholesterol, Total: 192 mg/dL (ref 100–199)
HDL: 52 mg/dL (ref >39)
LDL Chol Calc (NIH): 106 mg/dL — ABNORMAL HIGH (ref 0–99)
Triglycerides: 195 mg/dL — ABNORMAL HIGH (ref 0–149)
VLDL Cholesterol Cal: 34 mg/dL (ref 5–40)

## 2021-08-16 LAB — VITAMIN D 25 HYDROXY (VIT D DEFICIENCY, FRACTURES): Vit D, 25-Hydroxy: 43.8 ng/mL (ref 30.0–100.0)

## 2021-08-16 LAB — TSH: TSH: 5.22 u[IU]/mL — ABNORMAL HIGH (ref 0.450–4.500)

## 2021-08-16 LAB — MAGNESIUM: Magnesium: 2.3 mg/dL (ref 1.6–2.3)

## 2021-08-22 ENCOUNTER — Ambulatory Visit: Payer: 59 | Admitting: *Deleted

## 2021-08-22 DIAGNOSIS — E039 Hypothyroidism, unspecified: Secondary | ICD-10-CM

## 2021-08-22 DIAGNOSIS — Z0130 Encounter for examination of blood pressure without abnormal findings: Secondary | ICD-10-CM

## 2021-08-22 LAB — TOXASSURE SELECT 13 (MW), URINE

## 2021-08-22 MED ORDER — LEVOTHYROXINE SODIUM 50 MCG PO TABS: 50.0000 ug | ORAL_TABLET | Freq: Every day | ORAL | 1 refills | Status: DC

## 2021-08-22 NOTE — Progress Notes (Signed)
Yes I agree that she should purchase a different machine, the blood pressure here looks good

## 2021-08-22 NOTE — Progress Notes (Signed)
Pt came in for a b/p recheck, reading with our machine 135/72 whr 69 and with pt wrist machine 151/95 w/hr 69. Pt machine reading has a greater than 10 difference on systolic and diastolic. Advise pt to purchase new machine.   Per pt test results, rx for levothyroxine 58mcg and place future order for TSH per Dr Dettinger

## 2021-08-26 ENCOUNTER — Other Ambulatory Visit: Payer: Self-pay

## 2021-08-26 ENCOUNTER — Ambulatory Visit: Payer: 59

## 2021-08-26 DIAGNOSIS — Z79899 Other long term (current) drug therapy: Secondary | ICD-10-CM

## 2021-08-27 ENCOUNTER — Ambulatory Visit: Payer: 59 | Admitting: *Deleted

## 2021-08-27 DIAGNOSIS — R892 Abnormal level of other drugs, medicaments and biological substances in specimens from other organs, systems and tissues: Secondary | ICD-10-CM

## 2021-08-27 DIAGNOSIS — Z79899 Other long term (current) drug therapy: Secondary | ICD-10-CM | POA: Diagnosis not present

## 2021-08-27 DIAGNOSIS — S83512D Sprain of anterior cruciate ligament of left knee, subsequent encounter: Secondary | ICD-10-CM | POA: Diagnosis not present

## 2021-08-27 DIAGNOSIS — S83232A Complex tear of medial meniscus, current injury, left knee, initial encounter: Secondary | ICD-10-CM | POA: Diagnosis not present

## 2021-08-27 NOTE — Progress Notes (Signed)
Drug screen specimen left.

## 2021-08-29 ENCOUNTER — Telehealth: Payer: Self-pay

## 2021-08-29 NOTE — Telephone Encounter (Signed)
I s/w the pt and she is agreeable to move appt up for pre op clearance. Pt has been scheduled to see Dr. Gwenlyn Found 09/03/21 @ 2:30. Pt is grateful for the help. I will let the surgeon's office know that pt has appt 09/03/21 with Dr. Gwenlyn Found.  ?

## 2021-08-29 NOTE — Telephone Encounter (Signed)
? ?  Pre-operative Risk Assessment  ?  ?Patient Name: Kayla Romero  ?DOB: 1955-09-18 ?MRN: [id number]  ? ? Request for Surgical Clearance   ? ?Procedure:   LEFT KNEE ARTHROSCOPY WITH PARTIAL MEDIAL MENISECTOMY ? ?Date of Surgery:  Clearance 09/16/21                              ?   ?Surgeon:  DR Victorino December ?Surgeon's Group or Practice Name:  [facility] ATTN:KERRI MASE ?Phone number:  [phone] ?Fax number:  [phone] ?  ?Type of Clearance Requested:   ?- Medical  ?  ?Type of Anesthesia:   CHIOCE ?  ?Additional requests/questions:   ? ?Signed, ?Waylan Rocher   ?08/29/2021, 2:47 PM  ? ?

## 2021-09-01 LAB — TOXASSURE SELECT 13 (MW), URINE

## 2021-09-03 ENCOUNTER — Encounter (HOSPITAL_BASED_OUTPATIENT_CLINIC_OR_DEPARTMENT_OTHER): Payer: Self-pay | Admitting: Orthopedic Surgery

## 2021-09-03 ENCOUNTER — Encounter: Payer: Self-pay | Admitting: Cardiovascular Disease

## 2021-09-03 ENCOUNTER — Ambulatory Visit (INDEPENDENT_AMBULATORY_CARE_PROVIDER_SITE_OTHER): Payer: 59 | Admitting: Cardiovascular Disease

## 2021-09-03 ENCOUNTER — Other Ambulatory Visit: Payer: Self-pay

## 2021-09-03 DIAGNOSIS — G4733 Obstructive sleep apnea (adult) (pediatric): Secondary | ICD-10-CM

## 2021-09-03 DIAGNOSIS — R002 Palpitations: Secondary | ICD-10-CM | POA: Diagnosis not present

## 2021-09-03 NOTE — Progress Notes (Signed)
? ? ? ?09/03/2021 ?[name]   ?Jun 24, 1956  ?[id number] ? ?Primary Physician Dettinger, Fransisca Kaufmann, MD ?Primary Cardiologist: Lorretta Harp MD Lupe Carney, [place] ? ?HPI:  Kayla Romero is a 66 y.o.  mild to moderately overweight married Caucasian female mother of 9, grandmother of 7 grandchildren who takes care of her grandchildren at home and has not worked for the last 5 years.  She was referred by Chevis Pretty, FNP for evaluation of palpitations.  I last saw her in the office 07/31/2020.   Her risk factors include 40 to 60 pack years of tobacco abuse having quit 3 years ago.  She does not have diabetes or hyperlipidemia.  There is no family history for heart disease.  She is never had a heart attack or stroke.  She denies chest pain or shortness of breath.  She had new onset palpitations approxi-1 month ago that occur on a daily basis more so in the morning in the evening.  She has reduced her caffeine intake voluntarily not drinking decaf coffee.  24-hour Holter monitor was unrevealing, lab work showed TSH at the upper limit of normal and recent EKG performed in urgent care center showed unifocal PVCs. ?  ?A 2D echocardiogram performed 03/20/2019 was normal and an event monitor resulting on 04/03/2019 showed frequent PVCs occasionally in a trigeminal pattern.  She has palpitations on a daily basis. ?  ? I had prescribed a low-dose beta-blocker which she did not take but in lieu of that cut out caffeine, got more sleep and has been taking her alprazolam on a as needed basis for anxiety.  The frequency and no stability of her palpitations have significantly improved. ?  ?  Since I saw her a year ago her she continues to do well.  Her antianxiety medications were adjusted.  She is on magnesium repletion.  She had a negative GXT performed 02/07/2020.  Her palpitations have improved.  She denies chest pain or shortness of breath.  She does have symptoms consistent with obstructive sleep apnea.  She is  scheduled for left knee surgery in the upcoming future. ? ? ?Current Meds  ?Medication Sig  ? ALPRAZolam (XANAX) 0.25 MG tablet Take 2 tablets (0.5 mg total) by mouth at bedtime as needed (panic attack).  ? Cholecalciferol (VITAMIN D3) 50 MCG (2000 UT) TABS Take 2,000 Units by mouth in the morning.  ? FLUoxetine (PROZAC) 10 MG capsule Take 1 capsule (10 mg total) by mouth daily.  ? fluticasone (FLONASE) 50 MCG/ACT nasal spray SPRAY 2 SPRAYS INTO EACH NOSTRIL EVERY DAY (Patient taking differently: Place 1-2 sprays into both nostrils daily as needed for allergies. SPRAY 2 SPRAYS INTO EACH NOSTRIL EVERY DAY)  ? levothyroxine (SYNTHROID) 50 MCG tablet Take 1 tablet (50 mcg total) by mouth daily.  ? Magnesium 100 MG TABS Take 100 mg by mouth in the morning and at bedtime.  ? Polyethyl Glycol-Propyl Glycol (SYSTANE ULTRA OP) Place 1 drop into both eyes every evening.  ?  ? ?No Known Allergies ? ?Social History  ? ?Socioeconomic History  ? Marital status: Married  ?  Spouse name: Not on file  ? Number of children: Not on file  ? Years of education: Not on file  ? Highest education level: Not on file  ?Occupational History  ? Not on file  ?Tobacco Use  ? Smoking status: Former  ?  Packs/day: 1.00  ?  Years: 20.00  ?  Pack years: 20.00  ?  Types: Cigarettes  ?  Quit date: 05/31/2015  ?  Years since quitting: 6.2  ? Smokeless tobacco: Never  ? Tobacco comments:  ?  quit 2 weeks ago completely. had quit in 2012 but restarted  ?Vaping Use  ? Vaping Use: Never used  ?Substance and Sexual Activity  ? Alcohol use: No  ? Drug use: No  ? Sexual activity: Not on file  ?Other Topics Concern  ? Not on file  ?Social History Narrative  ? Not on file  ? ?Social Determinants of Health  ? ?Financial Resource Strain: Not on file  ?Food Insecurity: Not on file  ?Transportation Needs: Not on file  ?Physical Activity: Not on file  ?Stress: Not on file  ?Social Connections: Not on file  ?Intimate Partner Violence: Not on file  ?  ? ?Review of  Systems: ?General: negative for chills, fever, night sweats or weight changes.  ?Cardiovascular: negative for chest pain, dyspnea on exertion, edema, orthopnea, palpitations, paroxysmal nocturnal dyspnea or shortness of breath ?Dermatological: negative for rash ?Respiratory: negative for cough or wheezing ?Urologic: negative for hematuria ?Abdominal: negative for nausea, vomiting, diarrhea, bright red blood per rectum, melena, or hematemesis ?Neurologic: negative for visual changes, syncope, or dizziness ?All other systems reviewed and are otherwise negative except as noted above. ? ? ? ?Blood pressure 120/76, pulse 63, height '5\' 4"'$  (1.626 m), weight 182 lb (82.6 kg), last menstrual period 11/26/2010.  ?General appearance: alert and no distress ?Neck: no adenopathy, no carotid bruit, no JVD, supple, symmetrical, trachea midline, and thyroid not enlarged, symmetric, no tenderness/mass/nodules ?Lungs: clear to auscultation bilaterally ?Heart: regular rate and rhythm, S1, S2 normal, no murmur, click, rub or gallop ?Extremities: extremities normal, atraumatic, no cyanosis or edema ?Pulses: 2+ and symmetric ?Skin: Skin color, texture, turgor normal. No rashes or lesions ?Neurologic: Grossly normal ? ?EKG sinus rhythm 63 without ST or T wave changes.  I personally reviewed this EKG. ? ?ASSESSMENT AND PLAN:  ? ?Palpitations ?History of palpitations found to be PVCs on monitoring improved with decrease caffeine, magnesium repletion and antianxiety medications.  Her palpitations have markedly improved. ? ?Obstructive sleep apnea ?Body habitus consistent with obstructive sleep apnea along with nocturnal snoring and daytime somnolence.  This may be contributing to her palpitations.  I am going to get a sleep study to further evaluate. ? ? ? ? ?Lorretta Harp MD FACP,FACC,FAHA, FSCAI ?09/03/2021 ?3:22 PM ?

## 2021-09-03 NOTE — Assessment & Plan Note (Signed)
History of palpitations found to be PVCs on monitoring improved with decrease caffeine, magnesium repletion and antianxiety medications.  Her palpitations have markedly improved. ?

## 2021-09-03 NOTE — Progress Notes (Signed)
Spoke w/ via phone for pre-op interview: patient  ?Lab needs dos: NA ?Lab results: CMP and CBC 08/15/21; Echo 03/28/19. ?COVID test: patient states asymptomatic no test needed. ?Arrive at 12:15 pm ?NPO after MN except clear liquids.Clear liquids from MN until 11:00 am ?Med rec completed. ?Medications to take morning of surgery: Synthroid, Prozac, and Flonase ?Diabetic medication: NA ?Patient instructed no nail polish to be worn day of surgery. ?Patient instructed to bring photo id and insurance card day of surgery. ?Patient aware to have Driver (ride ) / caregiver for 24 hours after surgery. Husband or daughter to drive. ?Patient Special Instructions: NA ?Pre-Op special Istructions: NA ?Patient verbalized understanding of instructions that were given at this phone interview. ?Patient denies shortness of breath, chest pain, fever, cough at this phone interview.  ? ?Seeing cardiologist today, 09/03/21 for cardiac clearance. ?

## 2021-09-03 NOTE — Patient Instructions (Signed)
Medication Instructions:  ?Your physician recommends that you continue on your current medications as directed. Please refer to the Current Medication list given to you today. ? ?*If you need a refill on your cardiac medications before your next appointment, please call your pharmacy* ? ? ?Testing/Procedures: ?Your physician has recommended that you have a sleep study. This test records several body functions during sleep, including: brain activity, eye movement, oxygen and carbon dioxide blood levels, heart rate and rhythm, breathing rate and rhythm, the flow of air through your mouth and nose, snoring, body muscle movements, and chest and belly movement. Our sleep coordinator with call to schedule. ? ? ? ?Follow-Up: ?At [facility], you and your health needs are our priority.  As part of our continuing mission to provide you with exceptional heart care, we have created designated Provider Care Teams.  These Care Teams include your primary Cardiologist (physician) and Advanced Practice Providers (APPs -  Physician Assistants and Nurse Practitioners) who all work together to provide you with the care you need, when you need it. ? ?We recommend signing up for the patient portal called "MyChart".  Sign up information is provided on this After Visit Summary.  MyChart is used to connect with patients for Virtual Visits (Telemedicine).  Patients are able to view lab/test results, encounter notes, upcoming appointments, etc.  Non-urgent messages can be sent to your provider as well.   ?To learn more about what you can do with MyChart, go to [email or web address].   ? ?Your next appointment:   ?12 month(s) ? ?The format for your next appointment:   ?In Person ? ?Provider:   ?Quay Burow, MD  ?

## 2021-09-03 NOTE — Assessment & Plan Note (Signed)
Body habitus consistent with obstructive sleep apnea along with nocturnal snoring and daytime somnolence.  This may be contributing to her palpitations.  I am going to get a sleep study to further evaluate. ?

## 2021-09-08 NOTE — Anesthesia Preprocedure Evaluation (Addendum)
Anesthesia Evaluation  ?Patient identified by MRN, date of birth, ID band ?Patient awake ? ? ? ?Reviewed: ?Allergy & Precautions, NPO status , Patient's Chart, lab work & pertinent test results ? ?Airway ?Mallampati: II ? ?TM Distance: >3 FB ?Neck ROM: Full ? ? ? Dental ?no notable dental hx. ?(+) Teeth Intact, Dental Advisory Given ?  ?Pulmonary ?former smoker,  ?  ?Pulmonary exam normal ?breath sounds clear to auscultation ? ? ? ? ? ? Cardiovascular ?Exercise Tolerance: Good ?Normal cardiovascular exam ?Rhythm:Regular Rate:Normal ? ?09/03/21 EKG ?SR R63 w occ PVCs ?  ?Neuro/Psych ?Anxiety   ? GI/Hepatic ?negative GI ROS,   ?Endo/Other  ? ? Renal/GU ?  ? ?  ?Musculoskeletal ? ? Abdominal ?  ?Peds ? Hematology ?  ?Anesthesia Other Findings ? ? Reproductive/Obstetrics ? ?  ? ? ? ? ? ? ? ? ? ? ? ? ? ?  ?  ? ? ? ? ? ? ? ?Anesthesia Physical ?Anesthesia Plan ? ?ASA: 2 ? ?Anesthesia Plan: General  ? ?Post-op Pain Management: Dilaudid IV, Toradol IV (intra-op)* and Precedex  ? ?Induction: Intravenous ? ?PONV Risk Score and Plan: 4 or greater and Treatment may vary due to age or medical condition, Ondansetron, Midazolam and Dexamethasone ? ?Airway Management Planned: LMA ? ?Additional Equipment: None ? ?Intra-op Plan:  ? ?Post-operative Plan:  ? ?Informed Consent: I have reviewed the patients History and Physical, chart, labs and discussed the procedure including the risks, benefits and alternatives for the proposed anesthesia with the patient or authorized representative who has indicated his/her understanding and acceptance.  ? ? ? ?Dental advisory given ? ?Plan Discussed with:  ? ?Anesthesia Plan Comments:   ? ? ? ? ? ?Anesthesia Quick Evaluation ? ?

## 2021-09-09 ENCOUNTER — Ambulatory Visit (HOSPITAL_BASED_OUTPATIENT_CLINIC_OR_DEPARTMENT_OTHER): Payer: 59 | Admitting: Anesthesiology

## 2021-09-09 ENCOUNTER — Encounter (HOSPITAL_BASED_OUTPATIENT_CLINIC_OR_DEPARTMENT_OTHER)
Admission: RE | Disposition: A | Discharge status: Home / Self Care | Payer: Self-pay | Source: Home / Self Care | Attending: Orthopedic Surgery

## 2021-09-09 ENCOUNTER — Other Ambulatory Visit: Payer: Self-pay

## 2021-09-09 ENCOUNTER — Ambulatory Visit (HOSPITAL_BASED_OUTPATIENT_CLINIC_OR_DEPARTMENT_OTHER)
Admission: RE | Admit: 2021-09-09 | Discharge: 2021-09-09 | Disposition: A | Discharge status: Home / Self Care | Payer: 59 | Attending: Orthopedic Surgery | Admitting: Orthopedic Surgery

## 2021-09-09 ENCOUNTER — Encounter (HOSPITAL_BASED_OUTPATIENT_CLINIC_OR_DEPARTMENT_OTHER): Payer: Self-pay | Admitting: Orthopedic Surgery

## 2021-09-09 DIAGNOSIS — S83242A Other tear of medial meniscus, current injury, left knee, initial encounter: Secondary | ICD-10-CM

## 2021-09-09 DIAGNOSIS — Z87891 Personal history of nicotine dependence: Secondary | ICD-10-CM | POA: Diagnosis not present

## 2021-09-09 DIAGNOSIS — S83232A Complex tear of medial meniscus, current injury, left knee, initial encounter: Secondary | ICD-10-CM | POA: Diagnosis not present

## 2021-09-09 DIAGNOSIS — M659 Synovitis and tenosynovitis, unspecified: Secondary | ICD-10-CM | POA: Insufficient documentation

## 2021-09-09 DIAGNOSIS — M7122 Synovial cyst of popliteal space [Baker], left knee: Secondary | ICD-10-CM

## 2021-09-09 DIAGNOSIS — M2242 Chondromalacia patellae, left knee: Secondary | ICD-10-CM

## 2021-09-09 DIAGNOSIS — X58XXXA Exposure to other specified factors, initial encounter: Secondary | ICD-10-CM | POA: Insufficient documentation

## 2021-09-09 DIAGNOSIS — M94262 Chondromalacia, left knee: Secondary | ICD-10-CM | POA: Diagnosis not present

## 2021-09-09 DIAGNOSIS — F419 Anxiety disorder, unspecified: Secondary | ICD-10-CM | POA: Insufficient documentation

## 2021-09-09 DIAGNOSIS — R69 Illness, unspecified: Secondary | ICD-10-CM | POA: Diagnosis not present

## 2021-09-09 DIAGNOSIS — M65862 Other synovitis and tenosynovitis, left lower leg: Secondary | ICD-10-CM | POA: Diagnosis not present

## 2021-09-09 HISTORY — PX: KNEE ARTHROSCOPY WITH MEDIAL MENISECTOMY: SHX5651

## 2021-09-09 HISTORY — DX: Ventricular premature depolarization: I49.3

## 2021-09-09 SURGERY — ARTHROSCOPY, KNEE, WITH MEDIAL MENISCECTOMY
Anesthesia: General | Site: Knee | Laterality: Left

## 2021-09-09 MED ORDER — AMISULPRIDE (ANTIEMETIC) 5 MG/2ML IV SOLN: 10.0000 mg | Freq: Once | INTRAVENOUS | Status: DC | PRN

## 2021-09-09 MED ORDER — DEXMEDETOMIDINE (PRECEDEX) IN NS 20 MCG/5ML (4 MCG/ML) IV SYRINGE
PREFILLED_SYRINGE | INTRAVENOUS | Status: DC | PRN
  Administered 2021-09-09: 4 ug via INTRAVENOUS
  Administered 2021-09-09 (×2): 8 ug via INTRAVENOUS

## 2021-09-09 MED ORDER — OXYCODONE HCL 5 MG PO TABS
ORAL_TABLET | ORAL | Status: AC
  Filled 2021-09-09: qty 1

## 2021-09-09 MED ORDER — FENTANYL CITRATE (PF) 100 MCG/2ML IJ SOLN: 25.0000 ug | INTRAMUSCULAR | Status: DC | PRN

## 2021-09-09 MED ORDER — HYDROCODONE-ACETAMINOPHEN 5-325 MG PO TABS: 1.0000 | ORAL_TABLET | ORAL | 0 refills | Status: DC | PRN

## 2021-09-09 MED ORDER — KETOROLAC TROMETHAMINE 30 MG/ML IJ SOLN: 30.0000 mg | Freq: Once | INTRAMUSCULAR | Status: DC | PRN

## 2021-09-09 MED ORDER — PHENYLEPHRINE HCL (PRESSORS) 10 MG/ML IV SOLN
INTRAVENOUS | Status: DC | PRN
  Administered 2021-09-09: 80 ug via INTRAVENOUS
  Administered 2021-09-09: 120 ug via INTRAVENOUS

## 2021-09-09 MED ORDER — DEXAMETHASONE SODIUM PHOSPHATE 4 MG/ML IJ SOLN
INTRAMUSCULAR | Status: DC | PRN
Start: 2021-09-09 — End: 2021-09-09
  Administered 2021-09-09: 8 mg via INTRAVENOUS

## 2021-09-09 MED ORDER — ONDANSETRON HCL 4 MG PO TABS: 4.0000 mg | ORAL_TABLET | Freq: Three times a day (TID) | ORAL | 1 refills | Status: DC | PRN

## 2021-09-09 MED ORDER — ONDANSETRON HCL 4 MG/2ML IJ SOLN
INTRAMUSCULAR | Status: DC | PRN
  Administered 2021-09-09: 4 mg via INTRAVENOUS

## 2021-09-09 MED ORDER — OXYCODONE HCL 5 MG/5ML PO SOLN: 5.0000 mg | Freq: Once | ORAL | Status: AC | PRN

## 2021-09-09 MED ORDER — LIDOCAINE HCL (CARDIAC) PF 100 MG/5ML IV SOSY
PREFILLED_SYRINGE | INTRAVENOUS | Status: DC | PRN
  Administered 2021-09-09: 80 mg via INTRAVENOUS

## 2021-09-09 MED ORDER — FENTANYL CITRATE (PF) 100 MCG/2ML IJ SOLN
INTRAMUSCULAR | Status: AC
  Filled 2021-09-09: qty 2

## 2021-09-09 MED ORDER — LACTATED RINGERS IV SOLN: INTRAVENOUS | Status: DC

## 2021-09-09 MED ORDER — EPHEDRINE SULFATE (PRESSORS) 50 MG/ML IJ SOLN
INTRAMUSCULAR | Status: DC | PRN
  Administered 2021-09-09 (×4): 5 mg via INTRAVENOUS

## 2021-09-09 MED ORDER — ACETAMINOPHEN 10 MG/ML IV SOLN
INTRAVENOUS | Status: AC
  Filled 2021-09-09: qty 100

## 2021-09-09 MED ORDER — ACETAMINOPHEN 10 MG/ML IV SOLN
INTRAVENOUS | Status: DC | PRN
  Administered 2021-09-09: 1000 mg via INTRAVENOUS

## 2021-09-09 MED ORDER — CEFAZOLIN SODIUM-DEXTROSE 2-4 GM/100ML-% IV SOLN
2.0000 g | INTRAVENOUS | Status: AC
  Administered 2021-09-09: 2 g via INTRAVENOUS

## 2021-09-09 MED ORDER — MIDAZOLAM HCL 2 MG/2ML IJ SOLN
INTRAMUSCULAR | Status: AC
  Filled 2021-09-09: qty 2

## 2021-09-09 MED ORDER — MIDAZOLAM HCL 2 MG/2ML IJ SOLN
INTRAMUSCULAR | Status: DC | PRN
  Administered 2021-09-09: 2 mg via INTRAVENOUS

## 2021-09-09 MED ORDER — FENTANYL CITRATE (PF) 100 MCG/2ML IJ SOLN
INTRAMUSCULAR | Status: DC | PRN
  Administered 2021-09-09: 50 ug via INTRAVENOUS

## 2021-09-09 MED ORDER — BUPIVACAINE HCL 0.25 % IJ SOLN
INTRAMUSCULAR | Status: DC | PRN
  Administered 2021-09-09: 20 mL

## 2021-09-09 MED ORDER — SODIUM CHLORIDE 0.9 % IR SOLN
Status: DC | PRN
  Administered 2021-09-09: 900 mL

## 2021-09-09 MED ORDER — CEFAZOLIN SODIUM-DEXTROSE 2-4 GM/100ML-% IV SOLN
INTRAVENOUS | Status: AC
Start: 2021-09-09 — End: ?
  Filled 2021-09-09: qty 100

## 2021-09-09 MED ORDER — OXYCODONE HCL 5 MG PO TABS
5.0000 mg | ORAL_TABLET | Freq: Once | ORAL | Status: AC | PRN
  Administered 2021-09-09: 5 mg via ORAL

## 2021-09-09 MED ORDER — PROPOFOL 10 MG/ML IV BOLUS
INTRAVENOUS | Status: DC | PRN
  Administered 2021-09-09: 100 mg via INTRAVENOUS

## 2021-09-09 SURGICAL SUPPLY — 36 items
BANDAGE ESMARK 6X9 LF (GAUZE/BANDAGES/DRESSINGS) IMPLANT
BLADE SHAVER TORPEDO 4X13 (MISCELLANEOUS) ×1 IMPLANT
BNDG CMPR 9X6 STRL LF SNTH (GAUZE/BANDAGES/DRESSINGS)
BNDG ELASTIC 6X5.8 VLCR STR LF (GAUZE/BANDAGES/DRESSINGS) ×2 IMPLANT
BNDG ESMARK 6X9 LF (GAUZE/BANDAGES/DRESSINGS)
CLSR STERI-STRIP ANTIMIC 1/2X4 (GAUZE/BANDAGES/DRESSINGS) ×1 IMPLANT
CUFF TOURN SGL QUICK 34 (TOURNIQUET CUFF)
CUFF TRNQT CYL 34X4.125X (TOURNIQUET CUFF) IMPLANT
DRAPE ARTHROSCOPY W/POUCH 114 (DRAPES) ×2 IMPLANT
DRAPE U-SHAPE 47X51 STRL (DRAPES) ×2 IMPLANT
DRSG PAD ABDOMINAL 8X10 ST (GAUZE/BANDAGES/DRESSINGS) ×2 IMPLANT
DURAPREP 26ML APPLICATOR (WOUND CARE) ×3 IMPLANT
GAUZE 4X4 16PLY ~~LOC~~+RFID DBL (SPONGE) ×2 IMPLANT
GAUZE SPONGE 4X4 12PLY STRL (GAUZE/BANDAGES/DRESSINGS) ×2 IMPLANT
GAUZE XEROFORM 1X8 LF (GAUZE/BANDAGES/DRESSINGS) IMPLANT
GLOVE SRG 8 PF TXTR STRL LF DI (GLOVE) ×2 IMPLANT
GLOVE SURG ENC MOIS LTX SZ7.5 (GLOVE) ×4 IMPLANT
GLOVE SURG UNDER POLY LF SZ8 (GLOVE) ×4
GOWN STRL REUS W/TWL XL LVL3 (GOWN DISPOSABLE) ×4 IMPLANT
IV NS IRRIG 3000ML ARTHROMATIC (IV SOLUTION) ×4 IMPLANT
KIT TURNOVER CYSTO (KITS) ×2 IMPLANT
MANIFOLD NEPTUNE II (INSTRUMENTS) ×2 IMPLANT
PACK ARTHROSCOPY DSU (CUSTOM PROCEDURE TRAY) ×2 IMPLANT
PACK BASIN DAY SURGERY FS (CUSTOM PROCEDURE TRAY) ×2 IMPLANT
PAD ARMBOARD 7.5X6 YLW CONV (MISCELLANEOUS) IMPLANT
PADDING CAST ABS 6INX4YD NS (CAST SUPPLIES) ×1
PADDING CAST ABS COTTON 6X4 NS (CAST SUPPLIES) IMPLANT
PORT APPOLLO RF 90DEGREE MULTI (SURGICAL WAND) IMPLANT
STRIP CLOSURE SKIN 1/2X4 (GAUZE/BANDAGES/DRESSINGS) ×1 IMPLANT
SUT ETHILON 3 0 PS 1 (SUTURE) IMPLANT
SUT MNCRL AB 3-0 PS2 27 (SUTURE) ×1 IMPLANT
TOWEL OR 17X26 10 PK STRL BLUE (TOWEL DISPOSABLE) ×2 IMPLANT
TUBE CONNECTING 12X1/4 (SUCTIONS) ×3 IMPLANT
TUBING ARTHROSCOPY IRRIG 16FT (MISCELLANEOUS) ×2 IMPLANT
WAND APOLLORF SJ50 AR-9845 (SURGICAL WAND) IMPLANT
WATER STERILE IRR 500ML POUR (IV SOLUTION) ×2 IMPLANT

## 2021-09-09 NOTE — H&P (Signed)
? ?ORTHOPAEDIC CONSULTATION ? ?REQUESTING PHYSICIAN: Nicholes Stairs, MD ? ?PCP:  Dettinger, Fransisca Kaufmann, MD ? ?Chief Complaint: Left knee pain ? ?HPI: ?Kayla Romero is a 66 y.o. female who complains of persistent and recalcitrant left knee pain despite conservative treatment.  Here today for left knee arthroscopic partial medial meniscectomy.  No new complaints today. ? ?Past Medical History:  ?Diagnosis Date  ? Anxiety   ? h/o, recently has not had to use xanax much.  ? 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 being monitored  ? Depression   ? Dysrhythmia 2021  ? PVC  ? Fatty liver   ? History of EKG   ? first presented /w L shoulder pain, 2007, seen by [place]. , had EKG then & it was wnl.    ? Macular degeneration   ? Mediastinal mass   ? Obesity (BMI 30-39.9)   ? PVC's (premature ventricular contractions)   ? RUQ pain   ? 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 first seen on 2007, had some care with Dr. Arlyce Dice & then a Angelina Sheriff MD fr. [facility]    ? 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 diagnosed approx. 2007, seen by Dr. Arlyce Dice, but now followed by [facility] in [place]  ? Vitamin D deficiency   ? ?Past Surgical History:  ?Procedure Laterality Date  ? CHOLECYSTECTOMY N/A 08/31/2012  ? Procedure: LAPAROSCOPIC CHOLECYSTECTOMY possible IOC ;  Surgeon: Rolm Bookbinder, MD;  Location: [facility];  Service: General;  Laterality: N/A;  ? EYE SURGERY  2008  ? bilateral removal of cataracts, /w IOL  ? TUBAL LIGATION  1988  ? ?Social History  ? ?Socioeconomic History  ? Marital status: Married  ?  Spouse name: Not on file  ? Number of children: Not on file  ? Years of education: Not on file  ? Highest education level: Not on file  ?Occupational History  ? Not on file  ?Tobacco Use  ? Smoking status: Former  ?  Packs/day: 1.00  ?  Years: 20.00  ?  Pack years: 20.00  ?  Types: Cigarettes  ?  Quit date: 05/31/2015  ?  Years since quitting: 6.2  ? Smokeless tobacco: Never  ? Tobacco comments:  ?  quit 2 weeks ago completely. had quit in 2012 but  restarted  ?Vaping Use  ? Vaping Use: Never used  ?Substance and Sexual Activity  ? Alcohol use: No  ? Drug use: No  ? Sexual activity: Not on file  ?Other Topics Concern  ? Not on file  ?Social History Narrative  ? Not on file  ? ?Social Determinants of Health  ? ?Financial Resource Strain: Not on file  ?Food Insecurity: Not on file  ?Transportation Needs: Not on file  ?Physical Activity: Not on file  ?Stress: Not on file  ?Social Connections: Not on file  ? ?Family History  ?Problem Relation Age of Onset  ? Heart disease Mother   ? Cirrhosis Mother   ? Kidney disease Mother   ? Cancer Mother   ?     ovarian  ? Aneurysm Father   ? ?No Known Allergies ?Prior to Admission medications   ?Medication Sig Start Date End Date Taking? Authorizing Provider  ?Cholecalciferol (VITAMIN D3) 50 MCG (2000 UT) TABS Take 2,000 Units by mouth in the morning.   Yes [provider]  ?FLUoxetine (PROZAC) 10 MG capsule Take 1 capsule (10 mg total) by mouth daily. 08/15/21  Yes Dettinger,  Fransisca Kaufmann, MD  ?fluticasone (FLONASE) 50 MCG/ACT nasal spray SPRAY 2 SPRAYS INTO EACH NOSTRIL EVERY DAY ?Patient taking differently: Place 1-2 sprays into both nostrils daily as needed for allergies. SPRAY 2 SPRAYS INTO EACH NOSTRIL EVERY DAY 02/12/21  Yes Dettinger, Fransisca Kaufmann, MD  ?levothyroxine (SYNTHROID) 50 MCG tablet Take 1 tablet (50 mcg total) by mouth daily. 08/22/21  Yes Dettinger, Fransisca Kaufmann, MD  ?Magnesium 100 MG TABS Take 100 mg by mouth in the morning and at bedtime.   Yes [provider]  ?Polyethyl Glycol-Propyl Glycol (SYSTANE ULTRA OP) Place 1 drop into both eyes every evening.   Yes [provider]  ?ALPRAZolam (XANAX) 0.25 MG tablet Take 2 tablets (0.5 mg total) by mouth at bedtime as needed (panic attack). 08/15/21   Dettinger, Fransisca Kaufmann, MD  ? ?No results found. ? ?Positive ROS: All other systems have been reviewed and were otherwise negative with the exception of those mentioned in the HPI and as  above. ? ?Physical Exam: ?General: Alert, no acute distress ?Cardiovascular: No pedal edema ?Respiratory: No cyanosis, no use of accessory musculature ?GI: No organomegaly, abdomen is soft and non-tender ?Skin: No lesions in the area of chief complaint ?Neurologic: Sensation intact distally ?Psychiatric: Patient is competent for consent with normal mood and affect ?Lymphatic: No axillary or cervical lymphadenopathy ? ?MUSCULOSKELETAL: Left lower extremity is warm and well-perfused with no open wounds or lesions.  Neurovascular intact. ? ?Assessment: ?1.  Left knee acute medial meniscus tear, initial encounter. ? ?Plan: ?-Plan to proceed today with arthroscopic management of her medial meniscus tear of the left knee.  This is been a symptomatic tear that has failed conservative treatment.  We again discussed the risk and benefits of the procedure including but not limited to bleeding, infection, damage to surrounding nerves and vessels, stiffness, failure repair, progression of arthritis, DVT risk as well as risk of anesthesia.  She has provided informed consent. ? ?-Plan for discharge home postop from PACU. ? ? ? ?Nicholes Stairs, MD ?Cell [phone]  ? ? ?09/09/2021 ?1:45 PM ? ?

## 2021-09-09 NOTE — Op Note (Signed)
Surgery Date: 09/09/2021 ? ?Surgeon(s): ?Nicholes Stairs, MD ? ?Assistant: ?Jonelle Sidle, PA-C ? ?Assistant attestation: ? ?PA Thereasa Solo was present for the entire procedure.  He participated in all portions. ? ?ANESTHESIA:  general, local ? ?FLUIDS: Per anesthesia record.  ? ?ESTIMATED BLOOD LOSS: minimal ? ?PREOPERATIVE DIAGNOSES:  ?1. Left knee medial meniscus tear ?2.  knee synovitis ? ?POSTOPERATIVE DIAGNOSES:  ?1.  Left knee medial meniscus tear, acute ?2.  Left knee synovitis ?3.  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  Left knee popliteal cyst ? ?PROCEDURES PERFORMED:  ?1.  Left knee arthroscopy with arthroscopic partial medial meniscectomy ?2.left knee arthroscopy with arthroscopic chondroplasty medial femoral condyle and lateral tibial plateau.  ?3.  Left knee percutaneous aspiration of popliteal cyst ? ?DESCRIPTION OF PROCEDURE: Kayla Romero is a 66 y.o.-year-old female with left knee medial meniscus tear. Plans are to proceed with partial medial meniscectomy and diagnostic arthroscopy with debridement as indicated. Full discussion held regarding risks benefits alternatives and complications related surgical intervention. Conservative care options reviewed. All questions answered. ? ?The patient was identified in the preoperative holding area and the operative extremity was marked. The patient was brought to the operating room and transferred to operating table in a supine position. Satisfactory general anesthesia was induced by anesthesiology.   ? ?Standard anterolateral, anteromedial arthroscopy portals were obtained. The anteromedial portal was obtained with a spinal needle for localization under direct visualization with subsequent diagnostic findings.  ? ?Anteromedial and anterolateral chambers: moderate synovitis. The synovitis was debrided with a 4.5 mm full radius shaver through both the anteromedial and lateral portals.   ? ?Suprapatellar pouch and gutters: no synovitis or debris. ?Patella chondral surface: Grade 2 ?Trochlear chondral surface: Grade 0 ?Patellofemoral tracking: Midline and level ?Medial meniscus: Complex tear at the junction of the mid body and posterior horn.  This was a radial tear on the long a undersurface horizontal tear all the way to the capsule.  There was also an unstable superior leaflet on the more anterior half.  There was intact posterior root and anterior root.Marland Kitchen  ?Medial femoral condyle flexion bearing surface: Grade 3 ?Medial femoral condyle extension bearing surface: Grade 3 ?Medial tibial plateau: Grade 0 ?Anterior cruciate ligament:stable ?Posterior cruciate ligament:stable ?Lateral meniscus: Intact.   ?Lateral femoral condyle flexion bearing surface: Grade 0 ?Lateral femoral condyle extension bearing surface: Grade 0 ?Lateral tibial plateau: Grade 2 ? ?Partial medial meniscectomy was carried out combination of motorized shaver under suction as well as using the meniscal basket forceps.  Resection was taken back to stable tissue.  We did remove the entire undersurface of the horizontal component and then tailored and smooth the transition zones from intact tissue to the radial tear with anterior and posterior to that radial tear. ? ?We then palpated a large popliteal Baker's cyst.  This was present on the MRI.  At this juncture we elected to aspirate this via digital palpation.  Just lateral to the semimembranosus tendon we were able to perforate the popliteal cyst with a spinal needle.  We remove the stylette and drained approximately 8 to 10 cc of synovial fluid from the cyst. ? ?After completion of synovectomy, diagnostic exam, and debridements as described, all compartments were checked and no residual debris remained. Hemostasis was achieved with the cautery wand. The portals were approximated with buried monocryl. All excess fluid was expressed from the joint.  Xeroform sterile gauze dressings  were applied  followed by Ace bandage and ice pack.  ? ?DISPOSITION: The patient was awakened from general anesthetic, extubated, taken to the recovery room in medically stable condition, no apparent complications. The patient may be weightbearing as tolerated to the operative lower extremity.  Range of motion of right knee as tolerated. ? ?

## 2021-09-09 NOTE — Anesthesia Procedure Notes (Signed)
Procedure Name: LMA Insertion ?Date/Time: 09/09/2021 1:56 PM ?Performed by: Georgeanne Nim, CRNA ?Pre-anesthesia Checklist: Patient identified, Emergency Drugs available, Suction available, Patient being monitored and Timeout performed ?Patient Re-evaluated:Patient Re-evaluated prior to induction ?Oxygen Delivery Method: Circle system utilized ?Preoxygenation: Pre-oxygenation with 100% oxygen ?Induction Type: IV induction ?Ventilation: Mask ventilation without difficulty ?LMA Size: 4.0 ?Number of attempts: 1 ?Placement Confirmation: positive ETCO2, CO2 detector and breath sounds checked- equal and bilateral ?Tube secured with: Tape ?Dental Injury: Teeth and Oropharynx as per pre-operative assessment  ? ? ? ? ?

## 2021-09-09 NOTE — Discharge Instructions (Addendum)
Post-operative patient instructions  ?Knee Arthroscopy  ? ?Ice:  Place intermittent ice or cooler pack over your knee, 30 minutes on and 30 minutes off.  Continue this for the first 72 hours after surgery, then save ice for use after therapy sessions or on more active days.   ?Weight:  You may bear weight on your leg as your symptoms allow. ?DVT prevention: Perform ankle pumps as able throughout the day on the operative extremity.  Be mobile as possible with ambulation as able.  You should also take an 81 mg aspirin once per day x6 weeks. ?Crutches:  Use crutches (or walker) to assist in walking until told to discontinue by your physical therapist or physician. This will help to reduce pain. ?Strengthening:  Perform simple thigh squeezes (isometric quad contractions) and straight leg lifts as you are able (3 sets of 5 to 10 repetitions, 3 times a day).  For the leg lifts, have someone support under your ankle in the beginning until you have increased strength enough to do this on your own.  To help get started on thigh squeezes, place a pillow under your knee and push down on the pillow with back of knee (sometimes easier to do than with your leg fully straight). ?Motion:  Perform gentle knee motion as tolerated - this is gentle bending and straightening of the knee. Seated heel slides: you can start by sitting in a chair, remove your brace, and gently slide your heel back on the floor - allowing your knee to bend. Have someone help you straighten your knee (or use your other leg/foot hooked under your ankle.  ?Dressing:  Perform 1st dressing change at 3 days postoperative. A moderate amount of blood tinged drainage is to be expected.  So if you bleed through the dressing on the first or second day or if you have fevers, it is fine to change the dressing/check the wounds early and redress wound. Elevate your leg.  If it bleeds through again, or if the incisions are leaking frank blood, please call the office. May  change dressing every 1-2 days thereafter to help watch wounds. Can purchase Tegderm (or 74M Nexcare) water resistant dressings at local pharmacy / [facility]. ?Shower:  Light shower is ok after 3 days.  Please take shower, NO bath. Recover with gauze and ace wrap to help keep wounds protected.   ?Pain medication:  A narcotic pain medication has been prescribed.  Take as directed.  Typically you need narcotic pain medication more regularly during the first 3 to 5 days after surgery.  Decrease your use of the medication as the pain improves.  Narcotics can sometimes cause constipation, even after a few doses.  If you have problems with constipation, you can take an over the counter stool softener or light laxative.  If you have persistent problems, please notify your physician?s office. ?Physical therapy: Additional activity guidelines to be provided by your physician or physical therapist at follow-up visits.  ?Driving: Do not recommend driving x 3-7 days post surgical, especially if surgery performed on right side. Should not drive while taking narcotic pain medications. It typically takes at least 2 weeks to restore sufficient neuromuscular function for normal reaction times for driving safety.  ?Call [phone] for questions or problems. Evenings you will be forwarded to the hospital operator.  Ask for the orthopaedic physician on call. Please call if you experience:  ?  ?Redness, foul smelling, or persistent drainage from the surgical site  ?worsening knee pain and  swelling not responsive to medication  ?any calf pain and or swelling of the lower leg  ?temperatures greater than 101.5 F ?other questions or concerns ? ? ?Thank you for allowing [place] to be a part of your care ? ? ? ?Post Anesthesia Home Care Instructions ? ?Activity: ?Get plenty of rest for the remainder of the day. A responsible individual must stay with you for 24 hours following the procedure.  ?For the next 24 hours, DO NOT: ?-Drive a car ?-Conservation officer, nature ?-Drink alcoholic beverages ?-Take any medication unless instructed by your physician ?-Make any legal decisions or sign important papers. ? ?Meals: ?Start with liquid foods such as gelatin or soup. Progress to regular foods as tolerated. Avoid greasy, spicy, heavy foods. If nausea and/or vomiting occur, drink only clear liquids until the nausea and/or vomiting subsides. Call your physician if vomiting continues. ? ?Special Instructions/Symptoms: ?Your throat may feel dry or sore from the anesthesia or the breathing tube placed in your throat during surgery. If this causes discomfort, gargle with warm salt water. The discomfort should disappear within 24 hours. ? ?No Tylenol before 6 PM dose given in surgery at 2 PM. ?    ? ?

## 2021-09-09 NOTE — Brief Op Note (Signed)
09/09/2021 ? ?2:27 PM ? ?PATIENT:  Kayla Romero  66 y.o. female ? ?PRE-OPERATIVE DIAGNOSIS:  1. Left knee medial meniscus tear ? ?POST-OPERATIVE DIAGNOSIS:  1. Left knee medial meniscus tear ?2.  Left knee popliteal fossa cyst ? ?PROCEDURE:  Procedure(s): ?KNEE ARTHROSCOPY WITH PARTIAL MEDIAL MENISECTOMY (Left) ?2.  Left knee percutaneous aspiration of popliteal fossa cyst ? ?SURGEON:  Surgeon(s) and Role: ?   Stann Mainland, Elly Modena, MD - Primary ? ?PHYSICIAN ASSISTANT: Jonelle Sidle, PA-C ? ?ANESTHESIA:   local and general ? ?EBL:  5 cc ? ?BLOOD ADMINISTERED:none ? ?DRAINS: none  ? ?LOCAL MEDICATIONS USED:  NONE ? ?SPECIMEN:  No Specimen ? ?DISPOSITION OF SPECIMEN:  N/A ? ?COUNTS:  YES ? ?TOURNIQUET:  * Missing tourniquet times found for documented tourniquets in log: [id number] * ? ?DICTATION: .Note written in EPIC ? ?PLAN OF CARE: Discharge to home after PACU ? ?PATIENT DISPOSITION:  PACU - hemodynamically stable. ?  ?Delay start of Pharmacological VTE agent (>24hrs) due to surgical blood loss or risk of bleeding: not applicable ? ?

## 2021-09-09 NOTE — Anesthesia Postprocedure Evaluation (Signed)
Anesthesia Post Note ? ?Patient: Kayla Romero ? ?Procedure(s) Performed: KNEE ARTHROSCOPY WITH PARTIAL MEDIAL MENISECTOMY (Left: Knee) ? ?  ? ?Patient location during evaluation: PACU ?Anesthesia Type: General ?Level of consciousness: awake ?Pain management: pain level controlled ?Vital Signs Assessment: post-procedure vital signs reviewed and stable ?Respiratory status: spontaneous breathing, nonlabored ventilation, respiratory function stable and patient connected to nasal cannula oxygen ?Cardiovascular status: blood pressure returned to baseline and stable ?Postop Assessment: no apparent nausea or vomiting ?Anesthetic complications: no ? ? ?No notable events documented. ? ?Last Vitals:  ?Vitals:  ? 09/09/21 1515 09/09/21 1530  ?BP: 107/63 112/66  ?Pulse: (!) 58 60  ?Resp: 13 16  ?Temp: 36.5 ?C   ?SpO2: 95% 95%  ?  ?Last Pain:  ?Vitals:  ? 09/09/21 1530  ?TempSrc:   ?PainSc: 6   ? ? ?  ?  ?  ?  ?  ?  ? ?Braxtyn Bojarski P Antoni Stefan ? ? ? ? ?

## 2021-09-09 NOTE — Transfer of Care (Signed)
Immediate Anesthesia Transfer of Care Note ? ?Patient: Kayla Romero ? ?Procedure(s) Performed: KNEE ARTHROSCOPY WITH PARTIAL MEDIAL MENISECTOMY (Left: Knee) ? ?Patient Location: PACU ? ?Anesthesia Type:General ? ?Level of Consciousness: awake and patient cooperative ? ?Airway & Oxygen Therapy: Patient Spontanous Breathing and Patient connected to nasal cannula oxygen ? ?Post-op Assessment: Report given to RN and Post -op Vital signs reviewed and stable ? ?Post vital signs: Reviewed and stable ? ?Last Vitals:  ?Vitals Value Taken Time  ?BP    ?Temp    ?Pulse    ?Resp    ?SpO2    ? ? ?Last Pain:  ?Vitals:  ? 09/09/21 1234  ?TempSrc: Oral  ?PainSc: 0-No pain  ?   ? ?Patients Stated Pain Goal: 5 (09/09/21 1234) ? ?Complications: No notable events documented. ?

## 2021-09-10 ENCOUNTER — Encounter (HOSPITAL_BASED_OUTPATIENT_CLINIC_OR_DEPARTMENT_OTHER): Payer: Self-pay | Admitting: Orthopedic Surgery

## 2021-09-16 ENCOUNTER — Other Ambulatory Visit: Payer: Self-pay

## 2021-09-16 ENCOUNTER — Ambulatory Visit: Payer: 59 | Attending: Orthopedic Surgery

## 2021-09-16 DIAGNOSIS — M25662 Stiffness of left knee, not elsewhere classified: Secondary | ICD-10-CM | POA: Diagnosis present

## 2021-09-16 DIAGNOSIS — M25562 Pain in left knee: Secondary | ICD-10-CM | POA: Insufficient documentation

## 2021-09-16 NOTE — Therapy (Signed)
[place] ?[facility] ?[address] ?[place], [place], [address] ?Phone: [phone]   Fax:  [phone] ? ?Physical Therapy Evaluation ? ?Patient Details  ?Name: Kayla Romero ?MRN: [id number] ?Date of Birth: [birth year] ?Referring Provider (PT): Stann Mainland, MD ? ? ?Encounter Date: 09/16/2021 ? ? PT End of Session - 09/16/21 1028   ? ? Visit Number 1   ? Number of Visits 12   ? Date for PT Re-Evaluation 11/29/21   ? PT Start Time 1030   ? PT Stop Time 1120   ? PT Time Calculation (min) 50 min   ? Activity Tolerance Patient tolerated treatment well   ? Behavior During Therapy [facility] for tasks assessed/performed   ? ?  ?  ? ?  ? ? ?Past Medical History:  ?Diagnosis Date  ? Anxiety   ? h/o, recently has not had to use xanax much.  ? 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 being monitored  ? Depression   ? Dysrhythmia 2021  ? PVC  ? Fatty liver   ? History of EKG   ? first presented /w L shoulder pain, 2007, seen by [place]. , had EKG then & it was wnl.    ? Macular degeneration   ? Mediastinal mass   ? Obesity (BMI 30-39.9)   ? PVC's (premature ventricular contractions)   ? RUQ pain   ? 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 first seen on 2007, had some care with Dr. Arlyce Dice & then a Angelina Sheriff MD fr. [facility]    ? 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 diagnosed approx. 2007, seen by Dr. Arlyce Dice, but now followed by [facility] MD in [place]  ? Vitamin D deficiency   ? ? ?Past Surgical History:  ?Procedure Laterality Date  ? CHOLECYSTECTOMY N/A 08/31/2012  ? Procedure: LAPAROSCOPIC CHOLECYSTECTOMY possible IOC ;  Surgeon: Rolm Bookbinder, MD;  Location: [place];  Service: General;  Laterality: N/A;  ? EYE SURGERY  2008  ? bilateral removal of cataracts, /w IOL  ? KNEE ARTHROSCOPY WITH MEDIAL MENISECTOMY Left 09/09/2021  ? Procedure: KNEE ARTHROSCOPY WITH PARTIAL MEDIAL MENISECTOMY;  Surgeon: Nicholes Stairs, MD;  Location: [facility];  Service: Orthopedics;  Laterality: Left;  ? TUBAL LIGATION  1988   ? ? ?There were no vitals filed for this visit. ? ? ? Subjective Assessment - 09/16/21 1032   ? ? Subjective Patient reports that she had a left knee scope on 09/09/21. She has been using to ice and her pain medication to help her pain. She still has some numbness on the outside of her knee, but it's not bad. She has stopped wearing her compression stocking, but she notes wearing these for about 4 days. She has noticed some popping in both of her knees, but these are not hurting.   ? Limitations Walking;Standing   ? How long can you stand comfortably? about 15 minutes   ? How long can you walk comfortably? about 15 minutes   ? Patient Stated Goals return to walking 2-3 minutes per day, return to the gym,   ? Currently in Pain? Yes   ? Pain Score 6    ? Pain Location Knee   ? Pain Orientation Left   ? Pain Descriptors / Indicators Aching   ? Pain Type Surgical pain   ? Pain Radiating Towards none   ? Pain Onset In the past 7 days   ? Pain Frequency Constant   ?  Aggravating Factors  standing, walking, and letting her leg hang   ? Pain Relieving Factors ice, medication   ? Effect of Pain on Daily Activities she has to pace herself and take her time   ? ?  ?  ? ?  ? ? ? ? ? [facility] PT Assessment - 09/16/21 0001   ? ?  ? Assessment  ? Medical Diagnosis Encounter for other orthopedic aftercare   ? Referring Provider (PT) Stann Mainland, MD   ? Onset Date/Surgical Date 09/09/21   ? Next MD Visit 09/26/21   ? Prior Therapy No   ?  ? Precautions  ? Precautions None   ?  ? Restrictions  ? Weight Bearing Restrictions No   ?  ? Balance Screen  ? Has the patient fallen in the past 6 months No   ? Has the patient had a decrease in activity level because of a fear of falling?  Yes   ? Is the patient reluctant to leave their home because of a fear of falling?  No   ?  ? Home Environment  ? Living Environment Private residence   ? Type of Home House   ? Home Access Stairs to enter   ? Entrance Stairs-Number of Steps 7   step to pattern  ?  Entrance Stairs-Rails Can reach both   ? Home Layout One level   ? Home Equipment None   ?  ? Prior Function  ? Level of Independence Independent   ? Vocation Retired   ? Leisure walking, gym, gardening, fishing   ?  ? Cognition  ? Overall Cognitive Status Within Functional Limits for tasks assessed   ? Attention Focused   ? Focused Attention Appears intact   ? Memory Appears intact   ? Awareness Appears intact   ? Problem Solving Appears intact   ?  ? Observation/Other Assessments  ? Observations No bruising, redness, or heat around the knee   ?  ? Sensation  ? Additional Comments Patient reports numbness along her lateral knee   ?  ? ROM / Strength  ? AROM / PROM / Strength AROM;PROM   ?  ? AROM  ? Overall AROM Comments Right knee: WFL   ? AROM Assessment Site Knee   ? Right/Left Knee Left   ? Left Knee Extension 1   painful if position is held  ? Left Knee Flexion 93   tight  ?  ? PROM  ? PROM Assessment Site Knee   ? Right/Left Knee Left   ? Left Knee Flexion 107   limited by tightness and pain  ?  ? Palpation  ? Palpation comment TTP: left IT band, hip adductors, gastroc/soleus   ? ?  ?  ? ?  ? ? ? ? ? ? ? ? ? ? ? ? ? ?Objective measurements completed on examination: See above findings.  ? ? ? ? ? [facility] Adult PT Treatment/Exercise - 09/16/21 0001   ? ?  ? Exercises  ? Exercises Knee/Hip   ?  ? Knee/Hip Exercises: Stretches  ? Hip Flexor Stretch Left;3 reps;60 seconds   ?  ? Knee/Hip Exercises: Supine  ? Quad Sets Left;20 reps   5 second hold  ? Heel Slides Left;10 reps   ?  ? Modalities  ? Modalities Vasopneumatic   ?  ? Vasopneumatic  ? Number Minutes Vasopneumatic  15 minutes   ? Vasopnuematic Location  Knee   ? Vasopneumatic Pressure Low   ?  Vasopneumatic Temperature  34   ? ?  ?  ? ?  ? ? ? ? ? ? ? ? ? ? ? ? ? ? ? PT Long Term Goals - 09/16/21 1210   ? ?  ? PT LONG TERM GOAL #1  ? Title Patient will be independent with her HEP.   ? Time 6   ? Period Weeks   ? Status New   ? Target Date 10/28/21   ?  ? PT  LONG TERM GOAL #2  ? Title Patient will be able to demonstrate at least 120 degrees of active left knee flexion.   ? Time 6   ? Period Weeks   ? Status New   ? Target Date 10/28/21   ?  ? PT LONG TERM GOAL #3  ? Title Patient will report being able to return to her normal gym activities without being limited by her left knee pain.   ? Time 6   ? Period Weeks   ? Status New   ? Target Date 10/28/21   ?  ? PT LONG TERM GOAL #4  ? Title Patient will be able to navigate at least 4 steps with a reciprical gait pattern for improved function navigating her environment.   ? Time 6   ? Period Weeks   ? Status New   ? Target Date 10/28/21   ? ?  ?  ? ?  ? ? ? ? ? ? ? ? ? Plan - 09/16/21 1121   ? ? Clinical Impression Statement Patient is a 66 year old female presenting to physical therapy following a left knee scope on 09/09/21. She presented today with low pain severity and irritability. She was able to demonstrate excellent active knee extension as she was within 1 degree of neutral, but knee flexion remains limited due to pain and discomfort. She was provided a HEP to continue to improve her left knee mobiltiy and strength. She was able to properly demonstrate these interventions and reported feeling comfortable with these exercises. She reported that her knee felt a little better upon the conclusion of treatment. She continues to require skilled physical therapy to address her remaining impairments to return to her prior level of function.   ? Personal Factors and Comorbidities Other   ? Examination-Activity Limitations Locomotion Level;Transfers;Squat;Stairs;Stand;Lift   ? Examination-Participation Restrictions Community Activity;Cleaning;Shop;Yard Work   ? Stability/Clinical Decision Making Stable/Uncomplicated   ? Clinical Decision Making Low   ? Rehab Potential Excellent   ? PT Frequency 2x / week   ? PT Duration 6 weeks   ? PT Treatment/Interventions ADLs/Self Care Home Management;Cryotherapy;Electrical  Stimulation;Moist Heat;Neuromuscular re-education;Therapeutic exercise;Therapeutic activities;Functional mobility training;Stair training;Patient/family education;Manual techniques;Passive range of motion;Taping;Vasopneumat

## 2021-09-16 NOTE — Addendum Note (Signed)
Addended by: Darlin Coco on: 09/16/2021 12:17 PM ? ? Modules accepted: Orders ? ?

## 2021-09-19 ENCOUNTER — Other Ambulatory Visit: Payer: Self-pay | Admitting: Cardiovascular Disease

## 2021-09-19 DIAGNOSIS — R0683 Snoring: Secondary | ICD-10-CM

## 2021-09-19 DIAGNOSIS — R400 Somnolence: Secondary | ICD-10-CM

## 2021-09-20 ENCOUNTER — Other Ambulatory Visit: Payer: Self-pay

## 2021-09-20 ENCOUNTER — Ambulatory Visit: Payer: 59

## 2021-09-20 DIAGNOSIS — M25662 Stiffness of left knee, not elsewhere classified: Secondary | ICD-10-CM

## 2021-09-20 DIAGNOSIS — M25562 Pain in left knee: Secondary | ICD-10-CM

## 2021-09-20 NOTE — Therapy (Signed)
[place] ?[facility] ?[place] ?[place], [place], [address] ?Phone: [phone]   Fax:  [phone] ? ?Physical Therapy Treatment ? ?Patient Details  ?Name: Kayla Romero ?MRN: [id number] ?Date of Birth: Jan 30, 1956 ?Referring Provider (PT): Stann Mainland, MD ? ? ?Encounter Date: 09/20/2021 ? ? PT End of Session - 09/20/21 1121   ? ? Visit Number 2   ? Number of Visits 12   ? Date for PT Re-Evaluation 11/29/21   ? PT Start Time 1115   ? PT Stop Time 1210   ? PT Time Calculation (min) 55 min   ? Activity Tolerance Patient tolerated treatment well   ? Behavior During Therapy [facility] for tasks assessed/performed   ? ?  ?  ? ?  ? ? ?Past Medical History:  ?Diagnosis Date  ? Anxiety   ? h/o, recently has not had to use xanax much.  ? 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 being monitored  ? Depression   ? Dysrhythmia 2021  ? PVC  ? Fatty liver   ? History of EKG   ? first presented /w L shoulder pain, 2007, seen by [facility]. , had EKG then & it was wnl.    ? Macular degeneration   ? Mediastinal mass   ? Obesity (BMI 30-39.9)   ? PVC's (premature ventricular contractions)   ? RUQ pain   ? 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 first seen on 2007, had some care with Dr. Arlyce Dice & then a Angelina Sheriff MD fr. [facility]    ? 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 diagnosed approx. 2007, seen by Dr. Arlyce Dice, but now followed by [facility] MD in [place]  ? Vitamin D deficiency   ? ? ?Past Surgical History:  ?Procedure Laterality Date  ? CHOLECYSTECTOMY N/A 08/31/2012  ? Procedure: LAPAROSCOPIC CHOLECYSTECTOMY possible IOC ;  Surgeon: Rolm Bookbinder, MD;  Location: [facility];  Service: General;  Laterality: N/A;  ? EYE SURGERY  2008  ? bilateral removal of cataracts, /w IOL  ? KNEE ARTHROSCOPY WITH MEDIAL MENISECTOMY Left 09/09/2021  ? Procedure: KNEE ARTHROSCOPY WITH PARTIAL MEDIAL MENISECTOMY;  Surgeon: Nicholes Stairs, MD;  Location: [facility];  Service: Orthopedics;  Laterality: Left;  ? TUBAL LIGATION  1988   ? ? ?There were no vitals filed for this visit. ? ? Subjective Assessment - 09/20/21 1121   ? ? Subjective Pt arrives for today's treatment session reporting 2/10 left knee pain.   ? Limitations Walking;Standing   ? How long can you stand comfortably? about 15 minutes   ? How long can you walk comfortably? about 15 minutes   ? Patient Stated Goals return to walking 2-3 minutes per day, return to the gym,   ? Currently in Pain? Yes   ? Pain Score 2    ? Pain Location Knee   ? Pain Orientation Left   ? Pain Onset In the past 7 days   ? ?  ?  ? ?  ? ? ? ? ? [facility] PT Assessment - 09/20/21 0001   ? ?  ? Observation/Other Assessments  ? Focus on Therapeutic Outcomes (FOTO)  Complete   59  ? ?  ?  ? ?  ? ? ? ? ? ? ? ? ? ? ? ? ? ? ? ? [facility] Adult PT Treatment/Exercise - 09/20/21 0001   ? ?  ? Knee/Hip Exercises: Aerobic  ? Nustep Lvl 3 x 15 mins   ?  ?  Knee/Hip Exercises: Seated  ? Long Arc [employer] reps   ? Heel Slides Left;20 reps   3 sec hold  ? Marching Left;20 reps   ?  ? Knee/Hip Exercises: Supine  ? Quad Sets Left;20 reps   3 sec hold  ? Straight Leg Raises Left;2 sets;10 reps   mild extensor lag  ?  ? Modalities  ? Modalities Electrical Stimulation;Vasopneumatic   ?  ? Electrical Stimulation  ? Electrical Stimulation Location Left knee   ? Electrical Stimulation Action IFC   ? Electrical Stimulation Parameters 80-150 Hz x 15 mins   ? Electrical Stimulation Goals Pain;Edema   ?  ? Vasopneumatic  ? Number Minutes Vasopneumatic  15 minutes   ? Vasopnuematic Location  Knee   ? Vasopneumatic Pressure Low   ? Vasopneumatic Temperature  34   ? ?  ?  ? ?  ? ? ? ? ? ? ? ? ? ? ? ? ? ? ? PT Long Term Goals - 09/16/21 1210   ? ?  ? PT LONG TERM GOAL #1  ? Title Patient will be independent with her HEP.   ? Time 6   ? Period Weeks   ? Status New   ? Target Date 10/28/21   ?  ? PT LONG TERM GOAL #2  ? Title Patient will be able to demonstrate at least 120 degrees of active left knee flexion.   ? Time 6   ? Period Weeks   ?  Status New   ? Target Date 10/28/21   ?  ? PT LONG TERM GOAL #3  ? Title Patient will report being able to return to her normal gym activities without being limited by her left knee pain.   ? Time 6   ? Period Weeks   ? Status New   ? Target Date 10/28/21   ?  ? PT LONG TERM GOAL #4  ? Title Patient will be able to navigate at least 4 steps with a reciprical gait pattern for improved function navigating her environment.   ? Time 6   ? Period Weeks   ? Status New   ? Target Date 10/28/21   ? ?  ?  ? ?  ? ? ? ? ? ? ? ? Plan - 09/20/21 1121   ? ? Clinical Impression Statement Pt arrives for today's treatment session reporting 2/10 left knee pain.  Pt able to tolerate Nustep for warm up and assisting with ROM on seat 6 without issue.  Pt demonstrated slight extensor lag during SLR, but able to perform all reps asked of her.  Normal responses to estim and vaso upon removal. Pt instructed to add SLR to her HEP.  Pt reported 0/10 left knee pain at completion of today's treatment session.   ? Personal Factors and Comorbidities Other   ? Examination-Activity Limitations Locomotion Level;Transfers;Squat;Stairs;Stand;Lift   ? Examination-Participation Restrictions Community Activity;Cleaning;Shop;Yard Work   ? Stability/Clinical Decision Making Stable/Uncomplicated   ? Rehab Potential Excellent   ? PT Frequency 2x / week   ? PT Duration 6 weeks   ? PT Treatment/Interventions ADLs/Self Care Home Management;Cryotherapy;Electrical Stimulation;Moist Heat;Neuromuscular re-education;Therapeutic exercise;Therapeutic activities;Functional mobility training;Stair training;Patient/family education;Manual techniques;Passive range of motion;Taping;Vasopneumatic Device   ? PT Next Visit Plan FOTO; nustep, long arc quad, and improved knee flexion with modalities as needed   ? PT Home Exercise Plan Access Code: [email or web address]  URL: [email or web address]  Date: 09/16/2021  Prepared by: Jacqulynn Cadet    Exercises  Supine Quad Set - 2-3  x daily - 7 x weekly - 3 sets - 10 reps - 5 seconds hold  Seated Quad Set - 2-3 x daily - 7 x weekly - 3 sets - 10 reps - 5 seconds hold  Supine Heel Slide - 2-3 x daily - 7 x weekly - 3 sets - 10 reps  Seated Heel Slide - 2-3 x daily - 7 x weekly - 3 sets - 10 reps  Modified Thomas Stretch - 2-3 x daily - 7 x weekly - 3 sets - 30 seconds hold   ? Consulted and Agree with Plan of Care Patient   ? ?  ?  ? ?  ? ? ?Patient will benefit from skilled therapeutic intervention in order to improve the following deficits and impairments:  Decreased range of motion, Difficulty walking, Decreased activity tolerance, Pain, Hypomobility, Impaired sensation, Decreased strength, Increased edema ? ?Visit Diagnosis: ?Stiffness of left knee, not elsewhere classified ? ?Acute pain of left knee ? ? ? ? ?Problem List ?Patient Active Problem List  ? Diagnosis Date Noted  ? Obstructive sleep apnea 09/03/2021  ? Palpitations 03/25/2019  ? Obesity (BMI 30.0-34.9) 02/04/2018  ? Knee pain, chronic 08/08/2014  ? Anxiety   ? Depression   ? Fatty liver   ? Vitamin D deficiency   ? ? ?Kathrynn Ducking, PTA ?09/20/2021, 12:13 PM ? ?[place] ?[facility] ?[address] ?[place], [place], [address] ?Phone: [phone]   Fax:  [phone] ? ?Name: MARJORY MEINTS ?MRN: [id number] ?Date of Birth: 1955/11/01 ? ? ? ?

## 2021-09-23 ENCOUNTER — Other Ambulatory Visit: Payer: Self-pay

## 2021-09-23 ENCOUNTER — Ambulatory Visit: Payer: 59 | Admitting: Physical Therapy

## 2021-09-23 DIAGNOSIS — M25662 Stiffness of left knee, not elsewhere classified: Secondary | ICD-10-CM

## 2021-09-23 DIAGNOSIS — M25562 Pain in left knee: Secondary | ICD-10-CM

## 2021-09-23 NOTE — Therapy (Signed)
[place] ?[facility] ?[address] ?[place], [place], [address] ?Phone: [phone]   Fax:  [phone] ? ?Physical Therapy Treatment ? ?Patient Details  ?Name: Kayla Romero ?MRN: [id number] ?Date of Birth: 1955-07-28 ?Referring Provider (PT): Stann Mainland, MD ? ? ?Encounter Date: 09/23/2021 ? ? PT End of Session - 09/23/21 1218   ? ? Visit Number 3   ? Number of Visits 12   ? Date for PT Re-Evaluation 11/29/21   ? PT Start Time 1115   ? PT Stop Time 1202   ? PT Time Calculation (min) 47 min   ? Activity Tolerance Patient tolerated treatment well   ? Behavior During Therapy [facility] for tasks assessed/performed   ? ?  ?  ? ?  ? ? ?Past Medical History:  ?Diagnosis Date  ? Anxiety   ? h/o, recently has not had to use xanax much.  ? 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 being monitored  ? Depression   ? Dysrhythmia 2021  ? PVC  ? Fatty liver   ? History of EKG   ? first presented /w L shoulder pain, 2007, seen by [facility]. , had EKG then & it was wnl.    ? Macular degeneration   ? Mediastinal mass   ? Obesity (BMI 30-39.9)   ? PVC's (premature ventricular contractions)   ? RUQ pain   ? 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 first seen on 2007, had some care with Dr. Arlyce Dice & then a Angelina Sheriff MD fr. [facility]    ? 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 diagnosed approx. 2007, seen by Dr. Arlyce Dice, but now followed by [facility] in [place]  ? Vitamin D deficiency   ? ? ?Past Surgical History:  ?Procedure Laterality Date  ? CHOLECYSTECTOMY N/A 08/31/2012  ? Procedure: LAPAROSCOPIC CHOLECYSTECTOMY possible IOC ;  Surgeon: Rolm Bookbinder, MD;  Location: [facility];  Service: General;  Laterality: N/A;  ? EYE SURGERY  2008  ? bilateral removal of cataracts, /w IOL  ? KNEE ARTHROSCOPY WITH MEDIAL MENISECTOMY Left 09/09/2021  ? Procedure: KNEE ARTHROSCOPY WITH PARTIAL MEDIAL MENISECTOMY;  Surgeon: Nicholes Stairs, MD;  Location: [facility];  Service: Orthopedics;  Laterality: Left;  ? TUBAL LIGATION  1988   ? ? ?There were no vitals filed for this visit. ? ? Subjective Assessment - 09/23/21 1209   ? ? Subjective COVID-19 screen performed prior to patient entering clinic.  No new complaints.   ? Limitations Walking;Standing   ? How long can you stand comfortably? about 15 minutes   ? How long can you walk comfortably? about 15 minutes   ? Patient Stated Goals return to walking 2-3 minutes per day, return to the gym,   ? Currently in Pain? Yes   ? Pain Score 2    ? Pain Location Knee   ? Pain Orientation Left   ? Pain Descriptors / Indicators Aching   ? Pain Onset 1 to 4 weeks ago   ? ?  ?  ? ?  ? ? ? ? ? ? ? ? ? ? ? ? ? ? ? ? ? ? ? ? [facility] Adult PT Treatment/Exercise - 09/23/21 0001   ? ?  ? Exercises  ? Exercises Knee/Hip   ?  ? Knee/Hip Exercises: Aerobic  ? Nustep Level 15 minutes moving seat forward x 2 to increase flexion.   ?  ? Knee/Hip Exercises: Supine  ? Short Arc [employer]  Sets Limitations 3 minutes with 5#.   ?  ? Modalities  ? Modalities Electrical Stimulation;Vasopneumatic   ?  ? Electrical Stimulation  ? Electrical Stimulation Location Left knee.   ? Electrical Stimulation Action IFC at 80-150 Hz.   ? Electrical Stimulation Parameters 15 minutes.   ? Electrical Stimulation Goals Pain;Edema   ?  ? Manual Therapy  ? Manual Therapy Passive ROM   ? Passive ROM In supine:  Gentle flexion stretching to patient's left knee x 7 minutes.   ? ?  ?  ? ?  ? ? ? ? ? ? ? ? ? ? ? ? ? ? ? PT Long Term Goals - 09/16/21 1210   ? ?  ? PT LONG TERM GOAL #1  ? Title Patient will be independent with her HEP.   ? Time 6   ? Period Weeks   ? Status New   ? Target Date 10/28/21   ?  ? PT LONG TERM GOAL #2  ? Title Patient will be able to demonstrate at least 120 degrees of active left knee flexion.   ? Time 6   ? Period Weeks   ? Status New   ? Target Date 10/28/21   ?  ? PT LONG TERM GOAL #3  ? Title Patient will report being able to return to her normal gym activities without being limited by her left knee pain.   ? Time 6   ?  Period Weeks   ? Status New   ? Target Date 10/28/21   ?  ? PT LONG TERM GOAL #4  ? Title Patient will be able to navigate at least 4 steps with a reciprical gait pattern for improved function navigating her environment.   ? Time 6   ? Period Weeks   ? Status New   ? Target Date 10/28/21   ? ?  ?  ? ?  ? ? ? ? ? ? ? ? Plan - 09/23/21 1215   ? ? Clinical Impression Statement The patient is making excellent progress and is very motivated.  She did great with resisted short arc quads and no pain increase.  We discussed progressing to recumbent bike next visit and possibly pain-free range of motion weight machines.   ? Personal Factors and Comorbidities Other   ? Examination-Activity Limitations Locomotion Level;Transfers;Squat;Stairs;Stand;Lift   ? Examination-Participation Restrictions Community Activity;Cleaning;Shop;Yard Work   ? Stability/Clinical Decision Making Stable/Uncomplicated   ? Rehab Potential Excellent   ? PT Frequency 2x / week   ? PT Duration 6 weeks   ? PT Treatment/Interventions ADLs/Self Care Home Management;Cryotherapy;Electrical Stimulation;Moist Heat;Neuromuscular re-education;Therapeutic exercise;Therapeutic activities;Functional mobility training;Stair training;Patient/family education;Manual techniques;Passive range of motion;Taping;Vasopneumatic Device   ? PT Next Visit Plan FOTO; nustep, long arc quad, and improved knee flexion with modalities as needed   ? Consulted and Agree with Plan of Care Patient   ? ?  ?  ? ?  ? ? ?Patient will benefit from skilled therapeutic intervention in order to improve the following deficits and impairments:  Decreased range of motion, Difficulty walking, Decreased activity tolerance, Pain, Hypomobility, Impaired sensation, Decreased strength, Increased edema ? ?Visit Diagnosis: ?Stiffness of left knee, not elsewhere classified ? ?Acute pain of left knee ? ? ? ? ?Problem List ?Patient Active Problem List  ? Diagnosis Date Noted  ? Obstructive sleep apnea  09/03/2021  ? Palpitations 03/25/2019  ? Obesity (BMI 30.0-34.9) 02/04/2018  ? Knee pain, chronic 08/08/2014  ? Anxiety   ?  Depression   ? Fatty liver   ? Vitamin D deficiency   ? ? ?Hiilani Jetter, [place], PT ?09/23/2021, 12:19 PM ? ?[place] ?[facility] ?[address] ?[place], [place], [address] ?Phone: [phone]   Fax:  [phone] ? ?Name: CARMILLA GRANVILLE ?MRN: [id number] ?Date of Birth: [birth year] ? ? ? ?

## 2021-09-25 ENCOUNTER — Other Ambulatory Visit: Payer: Self-pay | Admitting: Cardiovascular Disease

## 2021-09-25 DIAGNOSIS — R0683 Snoring: Secondary | ICD-10-CM

## 2021-09-25 DIAGNOSIS — R400 Somnolence: Secondary | ICD-10-CM

## 2021-09-27 ENCOUNTER — Ambulatory Visit: Payer: 59

## 2021-09-27 DIAGNOSIS — M25662 Stiffness of left knee, not elsewhere classified: Secondary | ICD-10-CM | POA: Diagnosis not present

## 2021-09-27 DIAGNOSIS — M25562 Pain in left knee: Secondary | ICD-10-CM

## 2021-09-27 NOTE — Therapy (Signed)
[place] ?[facility] ?[address] ?[place], [place], [address] ?Phone: [phone]   Fax:  [phone] ? ?Physical Therapy Treatment ? ?Patient Details  ?Name: Kayla Romero ?MRN: [id number] ?Date of Birth: Jul 07, 1955 ?Referring Provider (PT): Stann Mainland, MD ? ? ?Encounter Date: 09/27/2021 ? ? PT End of Session - 09/27/21 1123   ? ? Visit Number 4   ? Number of Visits 12   ? Date for PT Re-Evaluation 11/29/21   ? PT Start Time 1115   ? PT Stop Time 1205   ? PT Time Calculation (min) 50 min   ? Activity Tolerance Patient tolerated treatment well   ? Behavior During Therapy [facility] for tasks assessed/performed   ? ?  ?  ? ?  ? ? ?Past Medical History:  ?Diagnosis Date  ? Anxiety   ? h/o, recently has not had to use xanax much.  ? 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 being monitored  ? Depression   ? Dysrhythmia 2021  ? PVC  ? Fatty liver   ? History of EKG   ? first presented /w L shoulder pain, 2007, seen by [facility]. , had EKG then & it was wnl.    ? Macular degeneration   ? Mediastinal mass   ? Obesity (BMI 30-39.9)   ? PVC's (premature ventricular contractions)   ? RUQ pain   ? 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 first seen on 2007, had some care with Dr. Arlyce Dice & then a Angelina Sheriff MD fr. [facility]    ? 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 diagnosed approx. 2007, seen by Dr. Arlyce Dice, but now followed by [facility] in [place]  ? Vitamin D deficiency   ? ? ?Past Surgical History:  ?Procedure Laterality Date  ? CHOLECYSTECTOMY N/A 08/31/2012  ? Procedure: LAPAROSCOPIC CHOLECYSTECTOMY possible IOC ;  Surgeon: Rolm Bookbinder, MD;  Location: [facility];  Service: General;  Laterality: N/A;  ? EYE SURGERY  2008  ? bilateral removal of cataracts, /w IOL  ? KNEE ARTHROSCOPY WITH MEDIAL MENISECTOMY Left 09/09/2021  ? Procedure: KNEE ARTHROSCOPY WITH PARTIAL MEDIAL MENISECTOMY;  Surgeon: Nicholes Stairs, MD;  Location: [facility];  Service: Orthopedics;  Laterality: Left;  ? TUBAL LIGATION  1988   ? ? ?There were no vitals filed for this visit. ? ? Subjective Assessment - 09/27/21 1123   ? ? Subjective Pt arrives for today's treatment session denying any pain.   ? Patient is accompained by: Family member   ? Limitations Walking;Standing   ? How long can you stand comfortably? about 15 minutes   ? How long can you walk comfortably? about 15 minutes   ? Patient Stated Goals return to walking 2-3 minutes per day, return to the gym,   ? Currently in Pain? No/denies   ? Pain Onset 1 to 4 weeks ago   ? ?  ?  ? ?  ? ? ? ? ? ? ? ? ? ? ? ? ? ? ? ? ? ? ? ? [facility] Adult PT Treatment/Exercise - 09/27/21 0001   ? ?  ? Knee/Hip Exercises: Aerobic  ? Recumbent Bike Lvl 3 x 15 mins   ?  ? Knee/Hip Exercises: Machines for Strengthening  ? Cybex Knee Extension 10# 2 sets of 10   ? Cybex Knee Flexion 20# 2 sets of 10   ? Cybex Leg Press 1 plate x 20 reps   ?  ? Knee/Hip Exercises:  Standing  ? Heel Raises Both;20 reps   ? Heel Raises Limitations Toe Raises x 20 reps   ? Forward Step Up Left;20 reps;Hand Hold: 2;Step Height: 6"   ? Rocker Board 3 minutes   ?  ? Modalities  ? Modalities Vasopneumatic   ?  ? Electrical Stimulation  ? Electrical Stimulation Location Left knee.   ? Electrical Stimulation Action IFC   ? Electrical Stimulation Parameters 80-150 Hz x 15 mins   ? Electrical Stimulation Goals Pain;Edema   ?  ? Vasopneumatic  ? Number Minutes Vasopneumatic  15 minutes   ? Vasopnuematic Location  Knee   ? Vasopneumatic Pressure Medium   ? Vasopneumatic Temperature  34   ? ?  ?  ? ?  ? ? ? ? ? ? ? ? ? ? ? ? ? ? ? PT Long Term Goals - 09/16/21 1210   ? ?  ? PT LONG TERM GOAL #1  ? Title Patient will be independent with her HEP.   ? Time 6   ? Period Weeks   ? Status New   ? Target Date 10/28/21   ?  ? PT LONG TERM GOAL #2  ? Title Patient will be able to demonstrate at least 120 degrees of active left knee flexion.   ? Time 6   ? Period Weeks   ? Status New   ? Target Date 10/28/21   ?  ? PT LONG TERM GOAL #3  ? Title Patient  will report being able to return to her normal gym activities without being limited by her left knee pain.   ? Time 6   ? Period Weeks   ? Status New   ? Target Date 10/28/21   ?  ? PT LONG TERM GOAL #4  ? Title Patient will be able to navigate at least 4 steps with a reciprical gait pattern for improved function navigating her environment.   ? Time 6   ? Period Weeks   ? Status New   ? Target Date 10/28/21   ? ?  ?  ? ?  ? ? ? ? ? ? ? ? Plan - 09/27/21 1124   ? ? Clinical Impression Statement Pt arrives for today's treatment session denying any pain.  Pt able to tolerate introducted to recumbent bike for warm up and activity tolerance on seat 5.  Pt instructed in use of cybex knee flexion, extention, and leg press with min cues for proper technique and eccentric control.  Pt able to tolerate several standing exercises to increase strength, function, and safety with min cues for proper technique and posture.  Normal responses to estim and vaso noted.  Pt denied any pain at completion of today's treatment session.   ? Personal Factors and Comorbidities Other   ? Examination-Activity Limitations Locomotion Level;Transfers;Squat;Stairs;Stand;Lift   ? Examination-Participation Restrictions Community Activity;Cleaning;Shop;Yard Work   ? Stability/Clinical Decision Making Stable/Uncomplicated   ? Rehab Potential Excellent   ? PT Frequency 2x / week   ? PT Duration 6 weeks   ? PT Treatment/Interventions ADLs/Self Care Home Management;Cryotherapy;Electrical Stimulation;Moist Heat;Neuromuscular re-education;Therapeutic exercise;Therapeutic activities;Functional mobility training;Stair training;Patient/family education;Manual techniques;Passive range of motion;Taping;Vasopneumatic Device   ? PT Next Visit Plan FOTO; nustep, long arc quad, and improved knee flexion with modalities as needed   ? Consulted and Agree with Plan of Care Patient   ? ?  ?  ? ?  ? ? ?Patient will benefit from skilled therapeutic intervention in order  to  improve the following deficits and impairments:  Decreased range of motion, Difficulty walking, Decreased activity tolerance, Pain, Hypomobility, Impaired sensation, Decreased strength, Increased edema ? ?Visit Diagnosis: ?Stiffness of left knee, not elsewhere classified ? ?Acute pain of left knee ? ? ? ? ?Problem List ?Patient Active Problem List  ? Diagnosis Date Noted  ? Obstructive sleep apnea 09/03/2021  ? Palpitations 03/25/2019  ? Obesity (BMI 30.0-34.9) 02/04/2018  ? Knee pain, chronic 08/08/2014  ? Anxiety   ? Depression   ? Fatty liver   ? Vitamin D deficiency   ? ? ?Kathrynn Ducking, PTA ?09/27/2021, 12:08 PM ? ?[place] ?[facility] ?[address] ?[place], [place], [address] ?Phone: [phone]   Fax:  [phone] ? ?Name: Kayla Romero ?MRN: [id number] ?Date of Birth: 11-10-1955 ? ? ? ?

## 2021-10-01 ENCOUNTER — Ambulatory Visit: Payer: 59 | Attending: Orthopedic Surgery

## 2021-10-01 DIAGNOSIS — M25562 Pain in left knee: Secondary | ICD-10-CM | POA: Diagnosis not present

## 2021-10-01 DIAGNOSIS — M25662 Stiffness of left knee, not elsewhere classified: Secondary | ICD-10-CM | POA: Diagnosis not present

## 2021-10-01 NOTE — Therapy (Signed)
[place] ?[facility] ?[address] ?[place], [place], [address] ?Phone: [phone]   Fax:  [phone] ? ?Physical Therapy Treatment ? ?Patient Details  ?Name: Kayla Romero ?MRN: [id number] ?Date of Birth: 27-Oct-1955 ?Referring Provider (PT): Stann Mainland, MD ? ? ?Encounter Date: 10/01/2021 ? ? PT End of Session - 10/01/21 1119   ? ? Visit Number 5   ? Number of Visits 12   ? Date for PT Re-Evaluation 11/29/21   ? PT Start Time 1115   ? PT Stop Time 1202   ? PT Time Calculation (min) 47 min   ? Activity Tolerance Patient tolerated treatment well   ? Behavior During Therapy [facility] for tasks assessed/performed   ? ?  ?  ? ?  ? ? ?Past Medical History:  ?Diagnosis Date  ? Anxiety   ? h/o, recently has not had to use xanax much.  ? 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 being monitored  ? Depression   ? Dysrhythmia 2021  ? PVC  ? Fatty liver   ? History of EKG   ? first presented /w L shoulder pain, 2007, seen by [facility]. , had EKG then & it was wnl.    ? Macular degeneration   ? Mediastinal mass   ? Obesity (BMI 30-39.9)   ? PVC's (premature ventricular contractions)   ? RUQ pain   ? 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 first seen on 2007, had some care with Dr. Arlyce Dice & then a Angelina Sheriff MD fr. [facility]    ? 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 diagnosed approx. 2007, seen by Dr. Arlyce Dice, but now followed by [facility] in [place]  ? Vitamin D deficiency   ? ? ?Past Surgical History:  ?Procedure Laterality Date  ? CHOLECYSTECTOMY N/A 08/31/2012  ? Procedure: LAPAROSCOPIC CHOLECYSTECTOMY possible IOC ;  Surgeon: Rolm Bookbinder, MD;  Location: [facility];  Service: General;  Laterality: N/A;  ? EYE SURGERY  2008  ? bilateral removal of cataracts, /w IOL  ? KNEE ARTHROSCOPY WITH MEDIAL MENISECTOMY Left 09/09/2021  ? Procedure: KNEE ARTHROSCOPY WITH PARTIAL MEDIAL MENISECTOMY;  Surgeon: Nicholes Stairs, MD;  Location: [facility];  Service: Orthopedics;  Laterality: Left;  ? TUBAL LIGATION  1988   ? ? ?There were no vitals filed for this visit. ? ? Subjective Assessment - 10/01/21 1116   ? ? Subjective Patient reports that she feels good today. She notes that she is now able to get into and out of her car without pain.   ? Patient is accompained by: Family member   ? Limitations Walking;Standing   ? How long can you stand comfortably? about 15 minutes   ? How long can you walk comfortably? about 15 minutes   ? Patient Stated Goals return to walking 2-3 minutes per day, return to the gym,   ? Currently in Pain? No/denies   ? Pain Onset 1 to 4 weeks ago   ? ?  ?  ? ?  ? ? ? ? ? [facility] PT Assessment - 10/01/21 0001   ? ?  ? Observation/Other Assessments  ? Focus on Therapeutic Outcomes (FOTO)  77.45   ?  ? AROM  ? Left Knee Extension 0   ? Left Knee Flexion 122   ? ?  ?  ? ?  ? ? ? ? ? ? ? ? ? ? ? ? ? ? ? ? [facility] Adult PT Treatment/Exercise - 10/01/21  0001   ? ?  ? Knee/Hip Exercises: Aerobic  ? Recumbent Bike L1-7 x 15 minutes   ?  ? Knee/Hip Exercises: Standing  ? Forward Lunges Both;20 reps   onto 6" step  ?  ? Knee/Hip Exercises: Supine  ? Bridges Both;2 sets;10 reps   with march  ? Straight Leg Raises Left;2 sets;10 reps   with hip abduction and eccentric lower  ?  ? Knee/Hip Exercises: Sidelying  ? Hip ABduction Both;10 reps   ?  ? Modalities  ? Modalities Vasopneumatic   ?  ? Vasopneumatic  ? Number Minutes Vasopneumatic  10 minutes   ? Vasopnuematic Location  Knee   ? Vasopneumatic Pressure Medium   ? Vasopneumatic Temperature  34   ? ?  ?  ? ?  ? ? ? ? ? ? ? ? ? ? ? ? ? ? ? PT Long Term Goals - 10/01/21 1153   ? ?  ? PT LONG TERM GOAL #1  ? Title Patient will be independent with her HEP.   ? Time 6   ? Period Weeks   ? Status On-going   ? Target Date 10/28/21   ?  ? PT LONG TERM GOAL #2  ? Title Patient will be able to demonstrate at least 120 degrees of active left knee flexion.   ? Time 6   ? Period Weeks   ? Status Achieved   ? Target Date 10/28/21   ?  ? PT LONG TERM GOAL #3  ? Title Patient will  report being able to return to her normal gym activities without being limited by her left knee pain.   ? Time 6   ? Period Weeks   ? Status On-going   ? Target Date 10/28/21   ?  ? PT LONG TERM GOAL #4  ? Title Patient will be able to navigate at least 4 steps with a reciprical gait pattern for improved function navigating her environment.   ? Time 6   ? Period Weeks   ? Status On-going   ? Target Date 10/28/21   ? ?  ?  ? ?  ? ? ? ? ? ? ? ? Plan - 10/01/21 1120   ? ? Clinical Impression Statement Patient was introduced to multiple new interventions for improved quadriceps and hamstring strength with moderate difficulty and fatigue. She required moderate cuing with sidelying hip abduction to prevent hip flexor engagement. She reported no pain or discomfort with any of today's interventions. She felt comfortable with her updated HEP and she was encouraged to begin to slowly return to her previous gym activities at a light level initially. She reported feeling good upon the conclusion of treatment. She continues to require skilled physical therapy to address her remaining impairments to return to her prior level of function.   ? Personal Factors and Comorbidities Other   ? Examination-Activity Limitations Locomotion Level;Transfers;Squat;Stairs;Stand;Lift   ? Examination-Participation Restrictions Community Activity;Cleaning;Shop;Yard Work   ? Stability/Clinical Decision Making Stable/Uncomplicated   ? Rehab Potential Excellent   ? PT Frequency 2x / week   ? PT Duration 6 weeks   ? PT Treatment/Interventions ADLs/Self Care Home Management;Cryotherapy;Electrical Stimulation;Moist Heat;Neuromuscular re-education;Therapeutic exercise;Therapeutic activities;Functional mobility training;Stair training;Patient/family education;Manual techniques;Passive range of motion;Taping;Vasopneumatic Device   ? PT Next Visit Plan FOTO; nustep, long arc quad, and improved knee flexion with modalities as needed   ? PT Home Exercise Plan  Access Code: [email or web address]  URL: [email or web address]  Date: 10/01/2021  Prepared by:  Jacqulynn Cadet    Exercises  - Straight Leg Raise with External Rotation  - 2 x daily - 7 x weekly - 3 sets - 10 reps  - Marching Bridge  - 2 x daily - 7 x weekly - 3 sets - 10 reps  - Sidelying Hip Abduction  - 2 x daily - 7 x weekly - 3 sets - 10 reps  - Standard Lunge  - 1 x daily - 7 x weekly - 3 sets - 10 reps   ? Consulted and Agree with Plan of Care Patient   ? ?  ?  ? ?  ? ? ?Patient will benefit from skilled therapeutic intervention in order to improve the following deficits and impairments:  Decreased range of motion, Difficulty walking, Decreased activity tolerance, Pain, Hypomobility, Impaired sensation, Decreased strength, Increased edema ? ?Visit Diagnosis: ?Stiffness of left knee, not elsewhere classified ? ?Acute pain of left knee ? ? ? ? ?Problem List ?Patient Active Problem List  ? Diagnosis Date Noted  ? Obstructive sleep apnea 09/03/2021  ? Palpitations 03/25/2019  ? Obesity (BMI 30.0-34.9) 02/04/2018  ? Knee pain, chronic 08/08/2014  ? Anxiety   ? Depression   ? Fatty liver   ? Vitamin D deficiency   ? ? ?Darlin Coco, PT ?10/01/2021, 12:03 PM ? ?[facility] ?[facility] ?[address] ?[place], [place], [address] ?Phone: [phone]   Fax:  [phone] ? ?Name: Kayla Romero ?MRN: [id number] ?Date of Birth: [birth year] ? ? ? ?

## 2021-10-03 ENCOUNTER — Ambulatory Visit: Payer: 59

## 2021-10-03 DIAGNOSIS — M25662 Stiffness of left knee, not elsewhere classified: Secondary | ICD-10-CM | POA: Diagnosis not present

## 2021-10-03 DIAGNOSIS — M25562 Pain in left knee: Secondary | ICD-10-CM | POA: Diagnosis not present

## 2021-10-03 NOTE — Therapy (Signed)
[place] ?[facility] ?[address] ?[place], [place], [address] ?Phone: [phone]   Fax:  [phone] ? ?Physical Therapy Treatment ? ?Patient Details  ?Name: Kayla Romero ?MRN: [id number] ?Date of Birth: Oct 26, 1955 ?Referring Provider (PT): Stann Mainland, MD ? ? ?Encounter Date: 10/03/2021 ? ? PT End of Session - 10/03/21 1357   ? ? Visit Number 6   ? Number of Visits 12   ? Date for PT Re-Evaluation 11/29/21   ? PT Start Time 1345   ? PT Stop Time 1540   ? PT Time Calculation (min) 59 min   ? Activity Tolerance Patient tolerated treatment well   ? Behavior During Therapy [facility] for tasks assessed/performed   ? ?  ?  ? ?  ? ? ?Past Medical History:  ?Diagnosis Date  ? Anxiety   ? h/o, recently has not had to use xanax much.  ? 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 being monitored  ? Depression   ? Dysrhythmia 2021  ? PVC  ? Fatty liver   ? History of EKG   ? first presented /w L shoulder pain, 2007, seen by [facility]. , had EKG then & it was wnl.    ? Macular degeneration   ? Mediastinal mass   ? Obesity (BMI 30-39.9)   ? PVC's (premature ventricular contractions)   ? RUQ pain   ? 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 first seen on 2007, had some care with Dr. Arlyce Dice & then a Angelina Sheriff MD fr. [facility]    ? 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 diagnosed approx. 2007, seen by Dr. Arlyce Dice, but now followed by [facility] in [place]  ? Vitamin D deficiency   ? ? ?Past Surgical History:  ?Procedure Laterality Date  ? CHOLECYSTECTOMY N/A 08/31/2012  ? Procedure: LAPAROSCOPIC CHOLECYSTECTOMY possible IOC ;  Surgeon: Rolm Bookbinder, MD;  Location: [facility];  Service: General;  Laterality: N/A;  ? EYE SURGERY  2008  ? bilateral removal of cataracts, /w IOL  ? KNEE ARTHROSCOPY WITH MEDIAL MENISECTOMY Left 09/09/2021  ? Procedure: KNEE ARTHROSCOPY WITH PARTIAL MEDIAL MENISECTOMY;  Surgeon: Nicholes Stairs, MD;  Location: [facility];  Service: Orthopedics;  Laterality: Left;  ? TUBAL LIGATION  1988   ? ? ?There were no vitals filed for this visit. ? ? Subjective Assessment - 10/03/21 1357   ? ? Subjective Pt arrives for today's treatment session denying any pain.  Pt admits to not performing exercises at home.   ? Patient is accompained by: Family member   ? Limitations Walking;Standing   ? How long can you stand comfortably? about 15 minutes   ? How long can you walk comfortably? about 15 minutes   ? Patient Stated Goals return to walking 2-3 minutes per day, return to the gym,   ? Pain Onset 1 to 4 weeks ago   ? ?  ?  ? ?  ? ? ? ? ? ? ? ? ? ? ? ? ? ? ? ? ? ? ? ? [facility] Adult PT Treatment/Exercise - 10/03/21 0001   ? ?  ? Knee/Hip Exercises: Aerobic  ? Recumbent Bike Lvl 5 x 15 mins   ?  ? Knee/Hip Exercises: Standing  ? Knee Flexion Both;20 reps   ? Knee Flexion Limitations red tband   ? Hip Flexion Left;20 reps;Knee straight   ? Hip Flexion Limitations red tband   ? Hip Abduction Left;20 reps;Knee straight   ?  Abduction Limitations red tband   ? Hip Extension Left;20 reps;Knee straight   ? Extension Limitations red tband   ? Lateral Step Up Left;20 reps   ? Forward Step Up Both;20 reps   ? Other Standing Knee Exercises side-steps and monster walk x 4 red tband   ?  ? Modalities  ? Modalities Vasopneumatic;Electrical Stimulation   ?  ? Electrical Stimulation  ? Electrical Stimulation Location Left knee.   ? Electrical Stimulation Action IFC   ? Electrical Stimulation Parameters 80-150 Hz x 15 mins   ? Electrical Stimulation Goals Pain;Edema   ?  ? Vasopneumatic  ? Number Minutes Vasopneumatic  15 minutes   ? Vasopnuematic Location  Knee   ? Vasopneumatic Pressure Low   ? Vasopneumatic Temperature  34   ? ?  ?  ? ?  ? ? ? ? ? ? ? ? ? ? ? ? ? ? ? PT Long Term Goals - 10/01/21 1153   ? ?  ? PT LONG TERM GOAL #1  ? Title Patient will be independent with her HEP.   ? Time 6   ? Period Weeks   ? Status On-going   ? Target Date 10/28/21   ?  ? PT LONG TERM GOAL #2  ? Title Patient will be able to demonstrate at least  120 degrees of active left knee flexion.   ? Time 6   ? Period Weeks   ? Status Achieved   ? Target Date 10/28/21   ?  ? PT LONG TERM GOAL #3  ? Title Patient will report being able to return to her normal gym activities without being limited by her left knee pain.   ? Time 6   ? Period Weeks   ? Status On-going   ? Target Date 10/28/21   ?  ? PT LONG TERM GOAL #4  ? Title Patient will be able to navigate at least 4 steps with a reciprical gait pattern for improved function navigating her environment.   ? Time 6   ? Period Weeks   ? Status On-going   ? Target Date 10/28/21   ? ?  ?  ? ?  ? ? ? ? ? ? ? ? Plan - 10/03/21 1358   ? ? Clinical Impression Statement Pt arrives for today's treatment session denying any pain.  Pt able to tolerate addition of numerous resisted standing exercises to increase knee strength, function, and safety.  Pt requiring min cues for proper technique and posture with all newly added exercises.  Normal responses to vaso and estim upon removal.  Pt denies any pain, but does endorse fatigue at completion of today's treatment session.  Pt given printed HEP and tband for home use.   ? Personal Factors and Comorbidities Other   ? Examination-Activity Limitations Locomotion Level;Transfers;Squat;Stairs;Stand;Lift   ? Examination-Participation Restrictions Community Activity;Cleaning;Shop;Yard Work   ? Stability/Clinical Decision Making Stable/Uncomplicated   ? Rehab Potential Excellent   ? PT Frequency 2x / week   ? PT Duration 6 weeks   ? PT Treatment/Interventions ADLs/Self Care Home Management;Cryotherapy;Electrical Stimulation;Moist Heat;Neuromuscular re-education;Therapeutic exercise;Therapeutic activities;Functional mobility training;Stair training;Patient/family education;Manual techniques;Passive range of motion;Taping;Vasopneumatic Device   ? PT Next Visit Plan FOTO; nustep, long arc quad, and improved knee flexion with modalities as needed   ? PT Home Exercise Plan Access Code: [email or web address]   URL: [email or web address]  Date: 10/01/2021  Prepared by: Jacqulynn Cadet    Exercises  - Straight Leg Raise with External Rotation  -  2 x daily - 7 x weekly - 3 sets - 10 reps  - Marching Bridge  - 2 x daily - 7 x weekly - 3 sets - 10 reps  - Sidelying Hip Abduction  - 2 x daily - 7 x weekly - 3 sets - 10 reps  - Standard Lunge  - 1 x daily - 7 x weekly - 3 sets - 10 reps   ? Consulted and Agree with Plan of Care Patient   ? ?  ?  ? ?  ? ? ?Patient will benefit from skilled therapeutic intervention in order to improve the following deficits and impairments:  Decreased range of motion, Difficulty walking, Decreased activity tolerance, Pain, Hypomobility, Impaired sensation, Decreased strength, Increased edema ? ?Visit Diagnosis: ?Stiffness of left knee, not elsewhere classified ? ?Acute pain of left knee ? ? ? ? ?Problem List ?Patient Active Problem List  ? Diagnosis Date Noted  ? Obstructive sleep apnea 09/03/2021  ? Palpitations 03/25/2019  ? Obesity (BMI 30.0-34.9) 02/04/2018  ? Knee pain, chronic 08/08/2014  ? Anxiety   ? Depression   ? Fatty liver   ? Vitamin D deficiency   ? ? ?Kathrynn Ducking, PTA ?10/03/2021, 3:02 PM ? ?[place] ?[facility] ?[address] ?[place], [place], [address] ?Phone: [phone]   Fax:  [phone] ? ?Name: Kayla Romero ?MRN: [id number] ?Date of Birth: 1955/08/14 ? ? ? ?

## 2021-10-07 ENCOUNTER — Ambulatory Visit: Payer: 59 | Attending: Cardiovascular Disease | Admitting: Cardiovascular Disease

## 2021-10-07 ENCOUNTER — Ambulatory Visit: Payer: 59

## 2021-10-07 DIAGNOSIS — R0683 Snoring: Secondary | ICD-10-CM | POA: Diagnosis not present

## 2021-10-07 DIAGNOSIS — G4736 Sleep related hypoventilation in conditions classified elsewhere: Secondary | ICD-10-CM | POA: Insufficient documentation

## 2021-10-07 DIAGNOSIS — R400 Somnolence: Secondary | ICD-10-CM | POA: Diagnosis not present

## 2021-10-07 DIAGNOSIS — M25662 Stiffness of left knee, not elsewhere classified: Secondary | ICD-10-CM | POA: Diagnosis not present

## 2021-10-07 DIAGNOSIS — M25562 Pain in left knee: Secondary | ICD-10-CM

## 2021-10-07 DIAGNOSIS — G4733 Obstructive sleep apnea (adult) (pediatric): Secondary | ICD-10-CM

## 2021-10-07 NOTE — Therapy (Addendum)
[facility] ?[place] ?[place], [place], [address] ?Phone: [phone]   Fax:  [phone] ? ?Physical Therapy Treatment ? ?Patient Details  ?Name: Kayla Romero ?MRN: [id number] ?Date of Birth: 1956/01/31 ?Referring Provider (PT): Stann Mainland, MD ? ? ?Encounter Date: 10/07/2021 ? ? PT End of Session - 10/07/21 1303   ? ? Visit Number 7   ? Number of Visits 12   ? Date for PT Re-Evaluation 11/29/21   ? PT Start Time 1300   ? PT Stop Time 1355   ? PT Time Calculation (min) 55 min   ? Activity Tolerance Patient tolerated treatment well   ? Behavior During Therapy [facility] for tasks assessed/performed   ? ?  ?  ? ?  ? ? ?Past Medical History:  ?Diagnosis Date  ? Anxiety   ? h/o, recently has not had to use xanax much.  ? 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 being monitored  ? Depression   ? Dysrhythmia 2021  ? PVC  ? Fatty liver   ? History of EKG   ? first presented /w L shoulder pain, 2007, seen by [facility]. , had EKG then & it was wnl.    ? Macular degeneration   ? Mediastinal mass   ? Obesity (BMI 30-39.9)   ? PVC's (premature ventricular contractions)   ? RUQ pain   ? 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 first seen on 2007, had some care with Dr. Arlyce Dice & then a Angelina Sheriff MD fr. [facility]    ? 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 diagnosed approx. 2007, seen by Dr. Arlyce Dice, but now followed by [facility] in [place]  ? Vitamin D deficiency   ? ? ?Past Surgical History:  ?Procedure Laterality Date  ? CHOLECYSTECTOMY N/A 08/31/2012  ? Procedure: LAPAROSCOPIC CHOLECYSTECTOMY possible IOC ;  Surgeon: Rolm Bookbinder, MD;  Location: [facility];  Service: General;  Laterality: N/A;  ? EYE SURGERY  2008  ? bilateral removal of cataracts, /w IOL  ? KNEE ARTHROSCOPY WITH MEDIAL MENISECTOMY Left 09/09/2021  ? Procedure: KNEE ARTHROSCOPY WITH PARTIAL MEDIAL MENISECTOMY;  Surgeon: Nicholes Stairs, MD;  Location: [facility];  Service: Orthopedics;  Laterality: Left;  ? TUBAL LIGATION  1988   ? ? ?There were no vitals filed for this visit. ? ? Subjective Assessment - 10/07/21 1302   ? ? Subjective Pt arrives for today's treatment session denying any pain.  Pt states that she performed her exercises over the weekend with minimal soreness.   ? Patient is accompained by: Family member   ? Limitations Walking;Standing   ? How long can you stand comfortably? about 15 minutes   ? How long can you walk comfortably? about 15 minutes   ? Patient Stated Goals return to walking 2-3 minutes per day, return to the gym,   ? Currently in Pain? No/denies   ? Pain Onset 1 to 4 weeks ago   ? ?  ?  ? ?  ? ? ? ? ? [facility] PT Assessment - 10/07/21 0001   ? ?  ? Observation/Other Assessments  ? Focus on Therapeutic Outcomes (FOTO)  86   7th visit, discharge  ? ?  ?  ? ?  ? ? ? ? ? ? ? ? ? ? ? ? ? ? ? ? [facility] Adult PT Treatment/Exercise - 10/07/21 0001   ? ?  ? Knee/Hip Exercises: Aerobic  ? Recumbent Bike Lvl 5  x 15 mins   ?  ? Knee/Hip Exercises: Machines for Strengthening  ? Cybex Knee Extension 20# x 20 reps   ? Cybex Knee Flexion 30# x 20 reps   ? Cybex Leg Press 2 plates x 20 reps   ?  ? Knee/Hip Exercises: Standing  ? Walking with Sports Cord Blue XTS 10 reps each direction   ?  ? Modalities  ? Modalities Vasopneumatic;Electrical Stimulation   ?  ? Electrical Stimulation  ? Electrical Stimulation Location Left knee   ? Electrical Stimulation Action IFC   ? Electrical Stimulation Parameters 80-150 Hz x 15 mins   ? Electrical Stimulation Goals Pain;Edema   ?  ? Vasopneumatic  ? Number Minutes Vasopneumatic  15 minutes   ? Vasopnuematic Location  Knee   ? Vasopneumatic Pressure Low   ? Vasopneumatic Temperature  34   ? ?  ?  ? ?  ? ? ? ? ? ? ? ? ? ? ? ? ? ? ? PT Long Term Goals - 10/07/21 1303   ? ?  ? PT LONG TERM GOAL #1  ? Title Patient will be independent with her HEP.   ? Time 6   ? Period Weeks   ? Status Achieved   ? Target Date 10/28/21   ?  ? PT LONG TERM GOAL #2  ? Title Patient will be able to demonstrate at least  120 degrees of active left knee flexion.   ? Time 6   ? Period Weeks   ? Status Achieved   ? Target Date 10/28/21   ?  ? PT LONG TERM GOAL #3  ? Title Patient will report being able to return to her normal gym activities without being limited by her left knee pain.   ? Time 6   ? Period Weeks   ? Status Achieved   ? Target Date 10/28/21   ?  ? PT LONG TERM GOAL #4  ? Title Patient will be able to navigate at least 4 steps with a reciprical gait pattern for improved function navigating her environment.   ? Time 6   ? Period Weeks   ? Status Achieved   ? Target Date 10/28/21   ? ?  ?  ? ?  ? ? ? ? ? ? ? ? Plan - 10/07/21 1303   ? ? Clinical Impression Statement Pt arrives for today's treatment session denying any pain.  Pt states that she performed her exercises over the weekend without issue.  Pt able to tolerate increased weight with all cybex exercises today.  Pt introduced to resisted walkouts with min cues required for eccentric control.  LOB x 1 requiring minA +1 to correct.  Normal responses to estim and vaso noted.  Pt has met all of her goals set forth for her at this time and feels that she is ready for discharge.  Pt encouraged to call the facility with any questions or concerns.   ? Personal Factors and Comorbidities Other   ? Examination-Activity Limitations Locomotion Level;Transfers;Squat;Stairs;Stand;Lift   ? Examination-Participation Restrictions Community Activity;Cleaning;Shop;Yard Work   ? Stability/Clinical Decision Making Stable/Uncomplicated   ? Rehab Potential Excellent   ? PT Frequency 2x / week   ? PT Duration 6 weeks   ? PT Treatment/Interventions ADLs/Self Care Home Management;Cryotherapy;Electrical Stimulation;Moist Heat;Neuromuscular re-education;Therapeutic exercise;Therapeutic activities;Functional mobility training;Stair training;Patient/family education;Manual techniques;Passive range of motion;Taping;Vasopneumatic Device   ? PT Next Visit Plan FOTO; nustep, long arc quad, and  improved knee flexion with  modalities as needed   ? PT Home Exercise Plan Access Code: [email or web address]  URL: [email or web address]  Date: 10/01/2021  Prepared by: Jacqulynn Cadet    Exercises  - Straight Leg Raise with External Rotation  - 2 x daily - 7 x weekly - 3 sets - 10 reps  - Marching Bridge  - 2 x daily - 7 x weekly - 3 sets - 10 reps  - Sidelying Hip Abduction  - 2 x daily - 7 x weekly - 3 sets - 10 reps  - Standard Lunge  - 1 x daily - 7 x weekly - 3 sets - 10 reps   ? Consulted and Agree with Plan of Care Patient   ? ?  ?  ? ?  ? ? ?Patient will benefit from skilled therapeutic intervention in order to improve the following deficits and impairments:  Decreased range of motion, Difficulty walking, Decreased activity tolerance, Pain, Hypomobility, Impaired sensation, Decreased strength, Increased edema ? ?Visit Diagnosis: ?Stiffness of left knee, not elsewhere classified ? ?Acute pain of left knee ? ? ? ? ?Problem List ?Patient Active Problem List  ? Diagnosis Date Noted  ? Obstructive sleep apnea 09/03/2021  ? Palpitations 03/25/2019  ? Obesity (BMI 30.0-34.9) 02/04/2018  ? Knee pain, chronic 08/08/2014  ? Anxiety   ? Depression   ? Fatty liver   ? Vitamin D deficiency   ? ? ?Kathrynn Ducking, PTA ?10/07/2021, 1:57 PM ? ?[facility] ?[place] [place] ?[place], [place], [address] ?Phone: [phone]   Fax:  [phone] ? ?Name: Kayla Romero ?MRN: [id number] ?Date of Birth: [birth year] ? ?PHYSICAL THERAPY DISCHARGE SUMMARY ? ?Visits from Start of Care: 7 ? ?Current functional level related to goals / functional outcomes: ?Patient was able to meet all of her goals for physical therapy at this time.  ?  ?Remaining deficits: ?None  ?  ?Education / Equipment: ?HEP   ? ?Patient agrees to discharge. Patient goals were met. Patient is being discharged due to meeting the stated rehab goals. ? ?Jacqulynn Cadet, PT, DPT   ? ?

## 2021-10-08 ENCOUNTER — Ambulatory Visit: Payer: 59 | Admitting: Physical Therapy

## 2021-10-18 ENCOUNTER — Encounter: Payer: Self-pay | Admitting: Cardiovascular Disease

## 2021-10-18 ENCOUNTER — Telehealth: Payer: Self-pay | Admitting: Cardiovascular Disease

## 2021-10-18 NOTE — Procedures (Signed)
? ? ? ?                                     [facility]     ? ? ? ?Patient Name: Kayla Romero, Kayla Romero ?Study Date: 10/07/2021 ?Gender: Female ?D.O.B: Jan 23, 1956 ?Age (years): 89 ?Referring Provider: Lorretta Harp ?Height (inches): 64 ?Interpreting Physician: Shelva Majestic MD, ABSM ?Weight (lbs): 182 ?RPSGT: Peak, Robert ?BMI: 31 ?MRN: [id number] ?Neck Size: <br> ? ?CLINICAL INFORMATION ?Sleep Study Type: HST ? ?Indication for sleep study: Snoring, nocturnal palpitations ? ?Epworth Sleepiness Score: 6 ? ?SLEEP STUDY TECHNIQUE ?A multi-channel overnight portable sleep study was performed. The channels recorded were: nasal airflow, thoracic respiratory movement, and oxygen saturation with a pulse oximetry. Snoring was also monitored. ? ?MEDICATIONS ?ALPRAZolam (XANAX) 0.25 MG tablet ?Cholecalciferol (VITAMIN D3) 50 MCG (2000 UT) TABS ?FLUoxetine (PROZAC) 10 MG capsule ?fluticasone (FLONASE) 50 MCG/ACT nasal spray ?HYDROcodone-acetaminophen (NORCO/VICODIN) 5-325 MG tablet ?levothyroxine (SYNTHROID) 50 MCG tablet ?Magnesium 100 MG TABS ?ondansetron (ZOFRAN) 4 MG tablet ?Polyethyl Glycol-Propyl Glycol (SYSTANE ULTRA OP)  ?Patient self administered medications include: N/A. ? ?SLEEP ARCHITECTURE ?Patient was studied for 458.9 minutes. The sleep efficiency was 95.6 % and the patient was supine for 99.9%. The arousal index was 0.0 per hour. ? ?RESPIRATORY PARAMETERS ?The overall AHI was 29.8 per hour, with a central apnea index of 0 per hour. ? ?The oxygen nadir was 74% during sleep. ? ?CARDIAC DATA ?Mean heart rate during sleep was 60.0 bpm. HR range was 48 - 87 bpm. ? ?IMPRESSIONS ?- Moderately-severe obstructive sleep apnea occurred during this study (AHI 29.8/h). The severity during REM sleep cannot be assessed on this home study. ?- Severe oxygen desaturation to a nadir of 74%. Time spent < 89% was 39.6 minutes. ?- Patient snored 5.4% during the sleep. ? ?DIAGNOSIS ?- Obstructive Sleep Apnea (G47.33) ?- Nocturnal Hypoxemia  (G47.36) ? ?RECOMMENDATIONS ?- In this patient with cardiovascular comorbidities recommend therapeutic CPAP for treatment of her sleep disordered breathing and significant nocturnal hypoxemia. If unable to obtain an in-lab titration, initiate Auto -PAP with EPR of 3 at 7 - 18 cm of water. ?- Effort should be made to optimize nasal and oropharyngeal patency. ?- Positional therapy avoiding supine position during sleep. ?- Avoid alcohol, sedatives and other CNS depressants that may worsen sleep apnea and disrupt normal sleep architecture. ?- Sleep hygiene should be reviewed to assess factors that may improve sleep quality. ?- Weight management (BMI( 31) and regular exercise should be initiated or continued. ?- Recommend a download and sleep clinic evaluation after one month of therapy. ? ? ?[Electronically signed] 10/18/2021 09:50 AM ? ?Shelva Majestic MD, [facility], ABSM ?Diplomate, Tax adviser of Sleep Medicine ? ?NPI: [id number] ? ?[place] ?PH: [phone]   FX: [phone] ?ACCREDITED BY THE AMERICAN ACADEMY OF SLEEP MEDICINE ? ?

## 2021-10-18 NOTE — Telephone Encounter (Signed)
Returned the call to the patient. She was calling to get the results of her sleep study. Message sent to the provider.  ?

## 2021-10-18 NOTE — Telephone Encounter (Signed)
Patient is calling back about her sleep study results. Patient has some questions she would like to ask  ?

## 2021-10-20 NOTE — Telephone Encounter (Signed)
Sleep study result was dictated ?

## 2021-10-21 ENCOUNTER — Other Ambulatory Visit: Payer: Self-pay | Admitting: Cardiovascular Disease

## 2021-10-21 ENCOUNTER — Telehealth: Payer: Self-pay | Admitting: *Deleted

## 2021-10-21 DIAGNOSIS — G4733 Obstructive sleep apnea (adult) (pediatric): Secondary | ICD-10-CM

## 2021-10-21 NOTE — Telephone Encounter (Signed)
Patient notified of Sleep study results and recommendations. She agrees to proceed with CPAP titration study if approved by insurance. ?

## 2021-10-21 NOTE — Telephone Encounter (Signed)
-----   Message from Troy Sine, MD sent at 10/18/2021  9:55 AM EDT ----- ?Mariann Laster, please notify pt and set up CPAP titration of Auto-PAP ?

## 2021-11-12 ENCOUNTER — Ambulatory Visit: Payer: Self-pay | Admitting: Cardiovascular Disease

## 2021-11-14 ENCOUNTER — Other Ambulatory Visit: Payer: 59

## 2021-11-14 DIAGNOSIS — E039 Hypothyroidism, unspecified: Secondary | ICD-10-CM

## 2021-11-15 LAB — TSH: TSH: 2.6 u[IU]/mL (ref 0.450–4.500)

## 2021-11-21 ENCOUNTER — Ambulatory Visit (HOSPITAL_BASED_OUTPATIENT_CLINIC_OR_DEPARTMENT_OTHER): Payer: 59 | Attending: Cardiovascular Disease | Admitting: Cardiovascular Disease

## 2021-11-21 DIAGNOSIS — G4733 Obstructive sleep apnea (adult) (pediatric): Secondary | ICD-10-CM | POA: Insufficient documentation

## 2021-11-21 DIAGNOSIS — Z79899 Other long term (current) drug therapy: Secondary | ICD-10-CM | POA: Diagnosis not present

## 2021-12-01 ENCOUNTER — Encounter (HOSPITAL_BASED_OUTPATIENT_CLINIC_OR_DEPARTMENT_OTHER): Payer: Self-pay | Admitting: Cardiovascular Disease

## 2021-12-01 NOTE — Procedures (Signed)
Patient Name: Kayla Romero, Kayla Romero Date: 11/21/2021 Gender: Female D.O.B: [birth year] Age (years): 65 Referring Provider: Lorretta Harp Height (inches): 64 Interpreting Physician: Shelva Majestic MD, ABSM Weight (lbs): 180 RPSGT: Jorge Ny BMI: 31 MRN: [id number] Neck Size: 15.00  CLINICAL INFORMATION The patient is referred for a CPAP titration to treat sleep apnea.  Date of HST: 10/07/2021:  AHI 29.8/h; O2 nadir 74%.  SLEEP STUDY TECHNIQUE As per the AASM Manual for the Scoring of Sleep and Associated Events v2.3 (April 2016) with a hypopnea requiring 4% desaturations.  The channels recorded and monitored were frontal, central and occipital EEG, electrooculogram (EOG), submentalis EMG (chin), nasal and oral airflow, thoracic and abdominal wall motion, anterior tibialis EMG, snore microphone, electrocardiogram, and pulse oximetry. Continuous positive airway pressure (CPAP) was initiated at the beginning of the study and titrated to treat sleep-disordered breathing.  MEDICATIONS ALPRAZolam (XANAX) 0.25 MG tablet Cholecalciferol (VITAMIN D3) 50 MCG (2000 UT) TABS FLUoxetine (PROZAC) 10 MG capsule fluticasone (FLONASE) 50 MCG/ACT nasal spray HYDROcodone-acetaminophen (NORCO/VICODIN) 5-325 MG tablet levothyroxine (SYNTHROID) 50 MCG tablet Magnesium 100 MG TABS ondansetron (ZOFRAN) 4 MG tablet Polyethyl Glycol-Propyl Glycol (SYSTANE ULTRA OP)  Medications self-administered by patient taken the night of the study : MAGNESIUM GLUCONATE  TECHNICIAN COMMENTS Comments added by technician: Patient had difficulty initiating sleep. Patient was restless all through the night. Comments added by scorer: N/A  RESPIRATORY PARAMETERS Optimal PAP Pressure (cm):  AHI at Optimal Pressure (/hr): N/A Overall Minimal O2 (%): 89.0 Supine % at Optimal Pressure (%): N/A Minimal O2 at Optimal Pressure (%): 89.0   SLEEP ARCHITECTURE The study was initiated at 11:04:09 PM and ended  at 5:03:16 AM.  Sleep onset time was 210.5 minutes and the sleep efficiency was 39.1%%. The total sleep time was 140.5 minutes.  The patient spent 1.8%% of the night in stage N1 sleep, 88.6%% in stage N2 sleep, 0.0%% in stage N3 and 9.6% in REM.Stage REM latency was 74.0 minutes  Wake after sleep onset was 8.1. Alpha intrusion was absent. Supine sleep was 0.00%.  CARDIAC DATA The 2 lead EKG demonstrated sinus rhythm. The mean heart rate was 54.0 beats per minute. Other EKG findings include: PVCs.  LEG MOVEMENT DATA The total Periodic Limb Movements of Sleep (PLMS) were 0. The PLMS index was 0.0. A PLMS index of <15 is considered normal in adults.  IMPRESSIONS - CPAP was initiated at 5 cm and maintained throughout the study with only 13 minutes and 30 seconds of REM sleep. (AHI 0.4/h; O2 nadir 89%). - Central sleep apnea was not noted during this titration (CAI  0.4/h). - Mild oxygen desaturations to a nadir of 89.0%. - No snoring was audible during this study. - 2-lead EKG demonstrated: PVCs - Clinically significant periodic limb movements were not noted during this study. Arousals associated with PLMs were significant.  DIAGNOSIS - Obstructive Sleep Apnea (G47.33)  RECOMMENDATIONS - Recommend a trial of CPAP Auto with EPR of 3 at 5 - 11 cm of water. - Effort should be made to optimize nasal and oropharyngeal patency. - Avoid alcohol, sedatives and other CNS depressants that may worsen sleep apnea and disrupt normal sleep architecture. - Sleep hygiene should be reviewed to assess factors that may improve sleep quality. - Weight management (BMI 31) and regular exercise should be initiated or continued. - Recommend a download and sleep clinic evaluation after 4 weeks of therapy.   [Electronically signed] 12/01/2021 08:08 PM  Shelva Majestic MD, [facility], [place], American  Board of Sleep Medicine  NPI: [id number]  [place] PH: [phone]   FX:  [phone] [place]

## 2021-12-05 ENCOUNTER — Telehealth: Payer: Self-pay | Admitting: Cardiovascular Disease

## 2021-12-05 NOTE — Telephone Encounter (Signed)
Patient calling for CPAP titration results and what she is to do next.

## 2021-12-09 NOTE — Telephone Encounter (Signed)
Returned a call to the patient in reference to her CPAP titration sleep study. All questions were answered. Patient will be changing insurances at the end of the month. She will do some "investigating" on her policies and call me back to let me know if she wants to proceed now or wait for her new insurance to begin in 2 weeks.

## 2021-12-11 ENCOUNTER — Other Ambulatory Visit: Payer: Self-pay | Admitting: Family Medicine

## 2021-12-18 ENCOUNTER — Other Ambulatory Visit: Payer: Self-pay | Admitting: Family Medicine

## 2021-12-18 DIAGNOSIS — F3341 Major depressive disorder, recurrent, in partial remission: Secondary | ICD-10-CM

## 2021-12-18 DIAGNOSIS — F419 Anxiety disorder, unspecified: Secondary | ICD-10-CM

## 2022-01-01 ENCOUNTER — Telehealth: Payer: Self-pay | Admitting: *Deleted

## 2022-01-01 NOTE — Telephone Encounter (Signed)
Patient called in with her new insurance information. [employer] subscriber ID # [id number]. She requests that her CPAP order be sent to [facility] in [place].

## 2022-01-07 DIAGNOSIS — M7122 Synovial cyst of popliteal space [Baker], left knee: Secondary | ICD-10-CM | POA: Diagnosis not present

## 2022-01-10 DIAGNOSIS — M25562 Pain in left knee: Secondary | ICD-10-CM | POA: Diagnosis not present

## 2022-01-21 ENCOUNTER — Other Ambulatory Visit: Payer: Self-pay | Admitting: Family Medicine

## 2022-01-21 DIAGNOSIS — F3341 Major depressive disorder, recurrent, in partial remission: Secondary | ICD-10-CM

## 2022-01-21 DIAGNOSIS — F419 Anxiety disorder, unspecified: Secondary | ICD-10-CM

## 2022-01-21 DIAGNOSIS — G4733 Obstructive sleep apnea (adult) (pediatric): Secondary | ICD-10-CM | POA: Diagnosis not present

## 2022-01-28 ENCOUNTER — Telehealth: Payer: Self-pay | Admitting: Cardiovascular Disease

## 2022-01-28 NOTE — Telephone Encounter (Signed)
Made appointment with patient to see Dr. Claiborne Billings on 04/07/22 for cpap compliance.

## 2022-01-28 NOTE — Telephone Encounter (Signed)
Noted. Thanks.

## 2022-01-28 NOTE — Telephone Encounter (Signed)
Pt called stating that her insurance requires that she follow up for her cpap with Dr. Claiborne Billings from 03/24/22 - 04/21/22 in order for them to cover it. Informed the pt that Dr.Kelly had no appts available. Please advise.

## 2022-02-08 ENCOUNTER — Other Ambulatory Visit: Payer: Self-pay | Admitting: Family Medicine

## 2022-02-12 ENCOUNTER — Encounter: Payer: Self-pay | Admitting: Family Medicine

## 2022-02-12 ENCOUNTER — Ambulatory Visit (INDEPENDENT_AMBULATORY_CARE_PROVIDER_SITE_OTHER): Payer: Medicare Other | Admitting: Family Medicine

## 2022-02-12 VITALS — BP 126/78 | HR 59 | Temp 97.4°F | Ht 64.0 in | Wt 180.0 lb

## 2022-02-12 DIAGNOSIS — Z13220 Encounter for screening for lipoid disorders: Secondary | ICD-10-CM | POA: Diagnosis not present

## 2022-02-12 DIAGNOSIS — F419 Anxiety disorder, unspecified: Secondary | ICD-10-CM

## 2022-02-12 DIAGNOSIS — F17210 Nicotine dependence, cigarettes, uncomplicated: Secondary | ICD-10-CM

## 2022-02-12 DIAGNOSIS — E039 Hypothyroidism, unspecified: Secondary | ICD-10-CM | POA: Diagnosis not present

## 2022-02-12 MED ORDER — ALPRAZOLAM 0.25 MG PO TABS: 0.5000 mg | ORAL_TABLET | Freq: Every evening | ORAL | 1 refills | Status: DC | PRN

## 2022-02-12 NOTE — Progress Notes (Signed)
BP 126/78   Pulse (!) 59   Temp (!) 97.4 F (36.3 C)   Ht '5\' 4"'  (1.626 m)   Wt 180 lb (81.6 kg)   LMP 11/26/2010   SpO2 95%   BMI 30.90 kg/m    Subjective:   Patient ID: Kayla Romero, female    DOB: 04/20/1956, 66 y.o.   MRN: [id number]  HPI: Kayla Romero is a 66 y.o. female presenting on 02/12/2022 for Medical Management of Chronic Issues, Hypothyroidism, Anxiety, and Depression   HPI Anxiety and depression Current rx-alprazolam 0.5 mg nightly. # meds rx-60 of the 0.25 mg tablets Effectiveness of current meds-works well, does not use all of the time Adverse reactions form meds-none  Pill count performed-No Last drug screen -08/30/2021 ( high risk q29m moderate risk q61mlow risk yearly ) Urine drug screen today- No [place]  If yes were their any concerning findings? -None  No flowsheet data found.   Controlled substance contract signed on: 08/30/2021  Hypothyroidism recheck Patient is coming in for thyroid recheck today as well. They deny any issues with hair changes or heat or cold problems or diarrhea or constipation. They deny any chest pain or palpitations. They are currently on levothyroxine 50 micrograms   Relevant past medical, surgical, family and social history reviewed and updated as indicated. Interim medical history since our last visit reviewed. Allergies and medications reviewed and updated.  Review of Systems  Constitutional:  Negative for chills and fever.  Eyes:  Negative for visual disturbance.  Respiratory:  Negative for chest tightness and shortness of breath.   Cardiovascular:  Negative for chest pain and leg swelling.  Musculoskeletal:  Negative for back pain and gait problem.  Skin:  Negative for rash.  Neurological:  Negative for dizziness, light-headedness and headaches.  Psychiatric/Behavioral:  Negative for agitation, behavioral problems, dysphoric mood, self-injury, sleep disturbance and suicidal ideas. The patient  is nervous/anxious.   All other systems reviewed and are negative.   Per HPI unless specifically indicated above   Allergies as of 02/12/2022   No Known Allergies      Medication List        Accurate as of February 12, 2022 11:20 AM. If you have any questions, ask your nurse or doctor.          STOP taking these medications    HYDROcodone-acetaminophen 5-325 MG tablet Commonly known as: NORCO/VICODIN Stopped by: JoFransisca Kaufmannettinger, MD   ondansetron 4 MG tablet Commonly known as: Zofran Stopped by: JoWorthy RancherMD       TAKE these medications    ALPRAZolam 0.25 MG tablet Commonly known as: XANAX Take 2 tablets (0.5 mg total) by mouth at bedtime as needed (panic attack).   FLUoxetine 10 MG capsule Commonly known as: PROZAC Take 1 capsule (10 mg total) by mouth daily.   fluticasone 50 MCG/ACT nasal spray Commonly known as: FLONASE SPRAY 2 SPRAYS INTO EACH NOSTRIL EVERY DAY   levothyroxine 50 MCG tablet Commonly known as: SYNTHROID TAKE 1 TABLET BY MOUTH EVERY DAY   Magnesium 100 MG Tabs Take 100 mg by mouth in the morning and at bedtime.   SYSTANE ULTRA OP Place 1 drop into both eyes every evening.   Vitamin D3 50 MCG (2000 UT) Tabs Take 2,000 Units by mouth in the morning.         Objective:   BP 126/78   Pulse (!) 59   Temp (!) 97.4 F (36.3  C)   Ht '5\' 4"'  (1.626 m)   Wt 180 lb (81.6 kg)   LMP 11/26/2010   SpO2 95%   BMI 30.90 kg/m   Wt Readings from Last 3 Encounters:  02/12/22 180 lb (81.6 kg)  11/21/21 180 lb (81.6 kg)  09/09/21 180 lb (81.6 kg)    Physical Exam Vitals and nursing note reviewed.  Constitutional:      General: She is not in acute distress.    Appearance: She is well-developed. She is not diaphoretic.  Eyes:     Conjunctiva/sclera: Conjunctivae normal.  Cardiovascular:     Rate and Rhythm: Normal rate and regular rhythm.     Heart sounds: Normal heart sounds. No murmur heard. Pulmonary:     Effort:  Pulmonary effort is normal. No respiratory distress.     Breath sounds: Normal breath sounds. No wheezing.  Musculoskeletal:        General: No swelling or tenderness. Normal range of motion.  Skin:    General: Skin is warm and dry.     Findings: No rash.  Neurological:     Mental Status: She is alert and oriented to person, place, and time.     Coordination: Coordination normal.  Psychiatric:        Behavior: Behavior normal.       Assessment & Plan:   Problem List Items Addressed This Visit       Endocrine   Acquired hypothyroidism - Primary   Relevant Orders   CBC with Differential/Platelet   CMP14+EGFR   Lipid panel   TSH     Other   Anxiety   Relevant Medications   ALPRAZolam (XANAX) 0.25 MG tablet   Other Visit Diagnoses     Lipid screening       Relevant Orders   CBC with Differential/Platelet   CMP14+EGFR   Lipid panel     Continue current medicine, no changes.  We will do refills.  We will check blood work.  Follow up plan: Return in about 6 months (around 08/15/2022), or if symptoms worsen or fail to improve, for Thyroid recheck.  Counseling provided for all of the vaccine components Orders Placed This Encounter  Procedures   CBC with Differential/Platelet   CMP14+EGFR   Lipid panel   TSH    Caryl Pina, MD [facility] 02/12/2022, 11:20 AM

## 2022-02-13 LAB — CBC WITH DIFFERENTIAL/PLATELET
Basophils Absolute: 0.1 10*3/uL (ref 0.0–0.2)
Basos: 1 %
EOS (ABSOLUTE): 0.1 10*3/uL (ref 0.0–0.4)
Eos: 2 %
Hematocrit: 38.6 % (ref 34.0–46.6)
Hemoglobin: 13 g/dL (ref 11.1–15.9)
Immature Grans (Abs): 0 10*3/uL (ref 0.0–0.1)
Immature Granulocytes: 0 %
Lymphocytes Absolute: 1.9 10*3/uL (ref 0.7–3.1)
Lymphs: 36 %
MCH: 30.3 pg (ref 26.6–33.0)
MCHC: 33.7 g/dL (ref 31.5–35.7)
MCV: 90 fL (ref 79–97)
Monocytes Absolute: 0.5 10*3/uL (ref 0.1–0.9)
Monocytes: 9 %
Neutrophils Absolute: 2.7 10*3/uL (ref 1.4–7.0)
Neutrophils: 52 %
Platelets: 295 10*3/uL (ref 150–450)
RBC: 4.29 x10E6/uL (ref 3.77–5.28)
RDW: 12.9 % (ref 11.7–15.4)
WBC: 5.1 10*3/uL (ref 3.4–10.8)

## 2022-02-13 LAB — CMP14+EGFR
ALT: 29 IU/L (ref 0–32)
AST: 23 IU/L (ref 0–40)
Albumin/Globulin Ratio: 1.4 (ref 1.2–2.2)
Albumin: 4.2 g/dL (ref 3.9–4.9)
Alkaline Phosphatase: 142 IU/L — ABNORMAL HIGH (ref 44–121)
BUN/Creatinine Ratio: 18 (ref 12–28)
BUN: 14 mg/dL (ref 8–27)
Bilirubin Total: 0.5 mg/dL (ref 0.0–1.2)
CO2: 26 mmol/L (ref 20–29)
Calcium: 9.4 mg/dL (ref 8.7–10.3)
Chloride: 102 mmol/L (ref 96–106)
Creatinine, Ser: 0.77 mg/dL (ref 0.57–1.00)
Globulin, Total: 2.9 g/dL (ref 1.5–4.5)
Glucose: 99 mg/dL (ref 70–99)
Potassium: 4.7 mmol/L (ref 3.5–5.2)
Sodium: 139 mmol/L (ref 134–144)
Total Protein: 7.1 g/dL (ref 6.0–8.5)
eGFR: 86 mL/min/{1.73_m2} (ref >59)

## 2022-02-13 LAB — LIPID PANEL
Chol/HDL Ratio: 4.1 ratio (ref 0.0–4.4)
Cholesterol, Total: 205 mg/dL — ABNORMAL HIGH (ref 100–199)
HDL: 50 mg/dL (ref >39)
LDL Chol Calc (NIH): 129 mg/dL — ABNORMAL HIGH (ref 0–99)
Triglycerides: 144 mg/dL (ref 0–149)
VLDL Cholesterol Cal: 26 mg/dL (ref 5–40)

## 2022-02-13 LAB — TSH: TSH: 1.62 u[IU]/mL (ref 0.450–4.500)

## 2022-02-18 DIAGNOSIS — Z9889 Other specified postprocedural states: Secondary | ICD-10-CM | POA: Diagnosis not present

## 2022-02-18 DIAGNOSIS — M7122 Synovial cyst of popliteal space [Baker], left knee: Secondary | ICD-10-CM | POA: Diagnosis not present

## 2022-02-20 ENCOUNTER — Other Ambulatory Visit: Payer: Self-pay | Admitting: Family Medicine

## 2022-02-20 DIAGNOSIS — F3341 Major depressive disorder, recurrent, in partial remission: Secondary | ICD-10-CM

## 2022-02-20 DIAGNOSIS — F419 Anxiety disorder, unspecified: Secondary | ICD-10-CM

## 2022-02-21 DIAGNOSIS — G4733 Obstructive sleep apnea (adult) (pediatric): Secondary | ICD-10-CM | POA: Diagnosis not present

## 2022-02-24 NOTE — Addendum Note (Signed)
Addended by: Caryl Pina on: 02/24/2022 07:57 AM   Modules accepted: Orders

## 2022-02-26 ENCOUNTER — Ambulatory Visit (INDEPENDENT_AMBULATORY_CARE_PROVIDER_SITE_OTHER): Payer: Medicare Other | Admitting: *Deleted

## 2022-02-26 DIAGNOSIS — Z23 Encounter for immunization: Secondary | ICD-10-CM | POA: Diagnosis not present

## 2022-03-24 ENCOUNTER — Ambulatory Visit (HOSPITAL_BASED_OUTPATIENT_CLINIC_OR_DEPARTMENT_OTHER): Payer: 59

## 2022-03-24 DIAGNOSIS — G4733 Obstructive sleep apnea (adult) (pediatric): Secondary | ICD-10-CM | POA: Diagnosis not present

## 2022-03-25 ENCOUNTER — Ambulatory Visit (HOSPITAL_BASED_OUTPATIENT_CLINIC_OR_DEPARTMENT_OTHER)
Admission: RE | Admit: 2022-03-25 | Discharge: 2022-03-25 | Disposition: A | Discharge status: Home / Self Care | Payer: Medicare Other | Source: Ambulatory Visit | Attending: Family Medicine | Admitting: Family Medicine

## 2022-03-25 DIAGNOSIS — F17210 Nicotine dependence, cigarettes, uncomplicated: Secondary | ICD-10-CM | POA: Insufficient documentation

## 2022-03-25 DIAGNOSIS — Z87891 Personal history of nicotine dependence: Secondary | ICD-10-CM | POA: Diagnosis not present

## 2022-04-07 ENCOUNTER — Encounter: Payer: Self-pay | Admitting: Cardiovascular Disease

## 2022-04-07 ENCOUNTER — Ambulatory Visit: Payer: Medicare Other | Attending: Cardiovascular Disease | Admitting: Cardiovascular Disease

## 2022-04-07 VITALS — BP 124/68 | HR 60 | Ht 64.0 in | Wt 181.0 lb

## 2022-04-07 DIAGNOSIS — G4733 Obstructive sleep apnea (adult) (pediatric): Secondary | ICD-10-CM

## 2022-04-07 DIAGNOSIS — R002 Palpitations: Secondary | ICD-10-CM | POA: Diagnosis not present

## 2022-04-07 DIAGNOSIS — R0683 Snoring: Secondary | ICD-10-CM

## 2022-04-07 DIAGNOSIS — I493 Ventricular premature depolarization: Secondary | ICD-10-CM

## 2022-04-07 NOTE — Progress Notes (Signed)
Cardiology Office Note    Date:  04/13/2022   ID:  Kayla, Romero May 30, 1956, MRN [id number]  PCP:  Dettinger, Fransisca Kaufmann, MD  Cardiologist:  Shelva Majestic, MD (sleep); Dr. Gwenlyn Found  New sleep consult referred by Dr. Gwenlyn Found  History of Present Illness:  Kayla Romero is a 66 y.o. female was followed by Dr. Quay Burow for cardiology care.  She has a history of palpitations, prior longstanding tobacco history.  An echo Doppler study in September 2020 was essentially normal.  She has been documented to have frequent PVCs with occasional trigeminal pattern on Holter monitoring.  When last evaluated by him in March 2023 to her body habitus consistent with obstructive sleep apnea along with nocturnal snoring and daytime somnolence she was referred for sleep study to assess for possible obstructive sleep apnea.  October 07, 2021 she underwent a home sleep study was found to have moderately severe obstructive sleep apnea with AHI of 29.8/h and had significant oxygen desaturation to a nadir of 74%.  On Nov 21, 2021 she underwent an in lab CPAP titration.  However, the study was limited and her CPAP was never titrated from 5 cm and she only had 13 minutes and 30 seconds of REM sleep.  As result, I recommended she initiate a trial of CPAP auto therapy with EPR of 3 at a pressure range of 5 to 11 cm of water.  [facility] is her DME company and she received a new ResMed AirSense 11 CPAP auto machine on January 21, 2022.  Prior to CPAP initiation, she admitted to snoring, had decreased energy, and had daytime sleepiness.  Now that she has been on CPAP therapy since her set up date she has much more energy.  She feels better when she wakes up.  Typically she goes to bed at 10 PM and wakes up between 5 and 5:30 AM.  She is unaware of any breakthrough snoring.  She has nocturia 0-1 time per night.  I obtained a download from July 25 through February 19, 2022.  Compliance is excellent with 100% use and  average use at 7 hours and 21 minutes.  At her 5 to 11 cm pressure range AHI is excellent at 2.9.  Her 95th percentile pressure is 9.2 with maximum average pressure 10.2.  I obtained a new download in the office today from September 7 through April 04, 2022.  Compliance continues to be excellent with 100% use.  At her 5-11 pressure range, AHI is 2.3/h with maximal average pressure at 9.8.  An Epworth Sleepiness Scale score was calculated in the office today and this endorsed that 1 argue against any residual daytime sleepiness.  She believes she is sleeping well.  He denies any bruxism, restless legs, hypnopompic or hypnagogic hallucinations or cataplectic events.  She presents for her initial sleep consultation/evaluation.   Past Medical History:  Diagnosis Date   Anxiety    h/o, recently has not had to use xanax much.   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 , had EKG then & it was wnl.     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)    first seen on 2007, had some care with Dr. Arlyce Dice & then a Angelina Sheriff MD fr. 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 2007, seen by Dr. Arlyce Dice, but now followed by [facility] in [place]   Vitamin D deficiency     Past Surgical History:  Procedure Laterality Date   CHOLECYSTECTOMY N/A 08/31/2012   Procedure: LAPAROSCOPIC CHOLECYSTECTOMY possible IOC ;  Surgeon: Rolm Bookbinder, MD;  Location: [facility] OR;  Service: General;  Laterality: N/A;   EYE SURGERY  2008   bilateral removal of cataracts, /w IOL   KNEE ARTHROSCOPY WITH MEDIAL MENISECTOMY Left 09/09/2021   Procedure: KNEE ARTHROSCOPY WITH PARTIAL MEDIAL MENISECTOMY;  Surgeon: Nicholes Stairs, MD;  Location: [facility];  Service: Orthopedics;  Laterality: Left;   TUBAL LIGATION  1988    Current Medications: Outpatient Medications Prior to Visit  Medication Sig Dispense Refill   ALPRAZolam (XANAX) 0.25 MG tablet Take 2 tablets (0.5 mg total) by mouth at bedtime as needed (panic attack). 60 tablet 1   Cholecalciferol (VITAMIN D3) 50 MCG (2000 UT) TABS Take 2,000 Units by mouth in the morning.     FLUoxetine (PROZAC) 10 MG capsule Take 1 capsule (10 mg total) by mouth daily. 90 capsule 3   fluticasone (FLONASE) 50 MCG/ACT nasal spray SPRAY 2 SPRAYS INTO EACH NOSTRIL EVERY DAY 48 mL 1   levothyroxine (SYNTHROID) 50 MCG tablet TAKE 1 TABLET BY MOUTH EVERY DAY 30 tablet 8   Magnesium 100 MG TABS Take 100 mg by mouth in the morning and at bedtime.     Polyethyl Glycol-Propyl Glycol (SYSTANE ULTRA OP) Place 1 drop into both eyes every evening.     No facility-administered medications prior to visit.     Allergies:   Patient has no known allergies.   Social History   Socioeconomic History   Marital status: Married    Spouse name: Not on file   Number of children: Not on file   Years of education: Not on file   Highest education level: Not on file  Occupational History   Not on file  Tobacco Use   Smoking status: Former    Packs/day: 1.00    Years: 20.00    Total pack years: 20.00    Types: Cigarettes    Quit date: 05/31/2015    Years since quitting: 6.8   Smokeless tobacco: Never   Tobacco comments:    quit 2 weeks ago completely. had quit in 2012 but restarted  Vaping Use   Vaping Use: Never used  Substance and Sexual Activity   Alcohol use: No   Drug use: No   Sexual activity: Not on file  Other Topics Concern   Not on file  Social History Narrative   Not on file   Social Determinants of Health   Financial Resource Strain: Not on file  Food Insecurity: Not on file  Transportation Needs: Not on file  Physical Activity: Not on file  Stress: Not on file  Social Connections:  Not on file    Socially she is in her second marriage.  Been with her current husband for a total of 16 years of which the last 10 they have been married.  She has 3 children ages 51, 46 and 65.  Her eldest child has sleep apnea.  Family History:  The patient's family history includes Aneurysm in her father; Cancer in  her mother; Cirrhosis in her mother; Heart disease in her mother; Kidney disease in her mother.   ROS General: Negative; No fevers, chills, or night sweats;  HEENT: Negative; No changes in vision or hearing, sinus congestion, difficulty swallowing Pulmonary: Negative; No cough, wheezing, shortness of breath, hemoptysis Cardiovascular: Negative; No chest pain, presyncope, syncope, palpitations GI: Negative; No nausea, vomiting, diarrhea, or abdominal pain GU: Negative; No dysuria, hematuria, or difficulty voiding Musculoskeletal: Negative; no myalgias, joint pain, or weakness Hematologic/Oncology: Negative; no easy bruising, bleeding Endocrine: Negative; no heat/cold intolerance; no diabetes Neuro: Negative; no changes in balance, headaches Skin: Negative; No rashes or skin lesions Psychiatric: Negative; No behavioral problems, depression Sleep: Negative; No snoring, daytime sleepiness, hypersomnolence, bruxism, restless legs, hypnogognic hallucinations, no cataplexy Other comprehensive 14 point system review is negative.   PHYSICAL EXAM:   VS:  BP 124/68 (BP Location: Left Arm, Patient Position: Sitting, Cuff Size: Large)   Pulse 60   Ht _0  (1.626 m)   Wt 181 lb (82.1 kg)   LMP 11/26/2010   BMI 31.07 kg/m     Repeat blood pressure by me was 30/68  Wt Readings from Last 3 Encounters:  04/07/22 181 lb (82.1 kg)  03/25/22 180 lb (81.6 kg)  02/12/22 180 lb (81.6 kg)    General: Alert, oriented, no distress.  Skin: normal turgor, no rashes, warm and dry HEENT: Normocephalic, atraumatic. Pupils equal round and reactive to light; sclera anicteric; extraocular  muscles intact;  Nose without nasal septal hypertrophy Mouth/Parynx benign; Mallinpatti scale 3 Neck: No JVD, no carotid bruits; normal carotid upstroke Lungs: clear to ausculatation and percussion; no wheezing or rales Chest wall: without tenderness to palpitation Heart: PMI not displaced, RRR, s1 s2 normal, 1/6 systolic murmur, no diastolic murmur, no rubs, gallops, thrills, or heaves Abdomen: soft, nontender; no hepatosplenomehaly, BS+; abdominal aorta nontender and not dilated by palpation. Back: no CVA tenderness Pulses 2+ Musculoskeletal: full range of motion, normal strength, no joint deformities Extremities: no clubbing cyanosis or edema, Homan's sign negative  Neurologic: grossly nonfocal; Cranial nerves grossly wnl Psychologic: Normal mood and affect   Studies/Labs Reviewed:   I personally reviewed the ECG from September 03, 2021: NSR at 32, no ectopy  Recent Labs:    Latest Ref Rng & Units 02/12/2022   11:31 AM 08/15/2021   11:31 AM 04/08/2021   12:00 PM  BMP  Glucose 70 - 99 mg/dL 99  96  68   BUN 8 - 27 mg/dL _1 Creatinine 0.57 - 1.00 mg/dL 0.77  0.79  0.64   BUN/Creat Ratio 12 - _2 Sodium 134 - 144 mmol/L 139  140  138   Potassium 3.5 - 5.2 mmol/L 4.7  4.5  3.9   Chloride 96 - 106 mmol/L 102  100  106   CO2 20 - 29 mmol/L _3 Calcium 8.7 - 10.3 mg/dL 9.4  9.6  9.3         Latest Ref Rng & Units 02/12/2022   11:31 AM 08/15/2021   11:31 AM 02/12/2021    9:50 AM  Hepatic Function  Total Protein 6.0 - 8.5 g/dL 7.1  7.2  6.9   Albumin 3.9 - 4.9 g/dL 4.2  4.4  4.5   AST 0 - 40 IU/L _4 ALT 0 - 32 IU/L _5 Alk Phosphatase 44 -  121 IU/L 142  137  111   Total Bilirubin 0.0 - 1.2 mg/dL 0.5  0.4  0.4        Latest Ref Rng & Units 02/12/2022   11:31 AM 08/15/2021   11:31 AM 04/08/2021   12:00 PM  CBC  WBC 3.4 - 10.8 x10E3/uL 5.1  7.0  6.3   Hemoglobin 11.1 - 15.9 g/dL 13.0  13.5  13.5   Hematocrit 34.0 - 46.6 % 38.6   40.6  40.6   Platelets 150 - 450 x10E3/uL 295  301  301    Lab Results  Component Value Date   MCV 90 02/12/2022   MCV 90 08/15/2021   MCV 92.5 04/08/2021   Lab Results  Component Value Date   TSH 1.620 02/12/2022   Lab Results  Component Value Date   HGBA1C 5.2% 10/18/2013     BNP No results found for: "BNP"  ProBNP No results found for: "PROBNP"   Lipid Panel     Component Value Date/Time   CHOL 205 (H) 02/12/2022 1131   TRIG 144 02/12/2022 1131   TRIG 120 07/18/2013 1451   HDL 50 02/12/2022 1131   HDL 41 07/18/2013 1451   CHOLHDL 4.1 02/12/2022 1131   LDLCALC 129 (H) 02/12/2022 1131   LDLCALC 76 07/18/2013 1451   LABVLDL 26 02/12/2022 1131     RADIOLOGY: CT CHEST LUNG CA SCREEN LOW DOSE W/O CM  Result Date: 03/26/2022 CLINICAL DATA:  Former smoker with 30 pack-year history EXAM: CT CHEST WITHOUT CONTRAST LOW-DOSE FOR LUNG CANCER SCREENING TECHNIQUE: Multidetector CT imaging of the chest was performed following the standard protocol without IV contrast. RADIATION DOSE REDUCTION: This exam was performed according to the departmental dose-optimization program which includes automated exposure control, adjustment of the mA and/or kV according to patient size and/or use of iterative reconstruction technique. COMPARISON:  Chest CT dated October 18, 2014 FINDINGS: Cardiovascular: Normal heart size. No pericardial effusion. Normal caliber thoracic aorta with mild calcified plaque. Mild coronary artery calcifications of the LAD. Mediastinum/Nodes: Esophagus and thyroid are unremarkable. No pathologically enlarged lymph nodes seen in the chest. 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 Lungs/Pleura: Central airways are patent. Mild centrilobular emphysema. No consolidation, pleural effusion or pneumothorax. Small bilateral solid pulmonary nodules largest measures 3.5  mm is located in the left upper lobe on image 78. Nodules not definitely seen on prior exam, likely due to differences in slice thickness, although given size less 4 mm nodules considered lung rads 2. Upper Abdomen: Cholecystectomy clips. Fatty atrophy of the pancreas. No acute abnormality. Musculoskeletal: No chest wall mass or suspicious bone lesions identified. IMPRESSION: 1. Lung-RADS 2S, benign appearance or behavior. Continue annual screening with low-dose chest CT without contrast in 12 months. 2. Mild coronary artery calcifications of the LAD. 3. Aortic Atherosclerosis (ICD10-I70.0) and Emphysema (ICD10-J43.9). Electronically Signed   By: Yetta Glassman M.D.   On: 03/26/2022 18:18     Additional studies/ records that were reviewed today include:   Patient Name: Saleena, Tamas Date: 11/21/2021 Gender: Female D.O.B: July 06, 1955 Age (years): 65 Referring Provider: Lorretta Harp Height (inches): 64 Interpreting Physician: Shelva Majestic MD, ABSM Weight (lbs): 180 RPSGT: Jorge Ny BMI: 31 MRN: [id number] Neck Size: 15.00   CLINICAL INFORMATION The patient is referred for a CPAP titration to treat sleep apnea.   Date of HST: 10/07/2021:  AHI 29.8/h; O2  nadir 74%.   SLEEP STUDY TECHNIQUE As per the AASM Manual for the Scoring of Sleep and Associated Events v2.3 (April 2016) with a hypopnea requiring 4% desaturations.   The channels recorded and monitored were frontal, central and occipital EEG, electrooculogram (EOG), submentalis EMG (chin), nasal and oral airflow, thoracic and abdominal wall motion, anterior tibialis EMG, snore microphone, electrocardiogram, and pulse oximetry. Continuous positive airway pressure (CPAP) was initiated at the beginning of the study and titrated to treat sleep-disordered breathing.   MEDICATIONS ALPRAZolam (XANAX) 0.25 MG tablet Cholecalciferol (VITAMIN D3) 50 MCG (2000 UT) TABS FLUoxetine (PROZAC) 10 MG capsule fluticasone (FLONASE) 50  MCG/ACT nasal spray HYDROcodone-acetaminophen (NORCO/VICODIN) 5-325 MG tablet levothyroxine (SYNTHROID) 50 MCG tablet Magnesium 100 MG TABS ondansetron (ZOFRAN) 4 MG tablet Polyethyl Glycol-Propyl Glycol (SYSTANE ULTRA OP)  Medications self-administered by patient taken the night of the study : MAGNESIUM GLUCONATE   TECHNICIAN COMMENTS Comments added by technician: Patient had difficulty initiating sleep. Patient was restless all through the night. Comments added by scorer: N/A   RESPIRATORY PARAMETERS Optimal PAP Pressure (cm):   AHI at Optimal Pressure (/hr):            N/A Overall Minimal O2 (%):         89.0     Supine % at Optimal Pressure (%):    N/A Minimal O2 at Optimal Pressure (%): 89.0        SLEEP ARCHITECTURE The study was initiated at 11:04:09 PM and ended at 5:03:16 AM.   Sleep onset time was 210.5 minutes and the sleep efficiency was 39.1%%. The total sleep time was 140.5 minutes.   The patient spent 1.8%% of the night in stage N1 sleep, 88.6%% in stage N2 sleep, 0.0%% in stage N3 and 9.6% in REM.Stage REM latency was 74.0 minutes   Wake after sleep onset was 8.1. Alpha intrusion was absent. Supine sleep was 0.00%.   CARDIAC DATA The 2 lead EKG demonstrated sinus rhythm. The mean heart rate was 54.0 beats per minute. Other EKG findings include: PVCs.   LEG MOVEMENT DATA The total Periodic Limb Movements of Sleep (PLMS) were 0. The PLMS index was 0.0. A PLMS index of <15 is considered normal in adults.   IMPRESSIONS - CPAP was initiated at 5 cm and maintained throughout the study with only 13 minutes and 30 seconds of REM sleep. (AHI 0.4/h; O2 nadir 89%). - Central sleep apnea was not noted during this titration (CAI  0.4/h). - Mild oxygen desaturations to a nadir of 89.0%. - No snoring was audible during this study. - 2-lead EKG demonstrated: PVCs - Clinically significant periodic limb movements were not noted during this study. Arousals associated with PLMs  were significant.   DIAGNOSIS - Obstructive Sleep Apnea (G47.33)   RECOMMENDATIONS - Recommend a trial of CPAP Auto with EPR of 3 at 5 - 11 cm of water. - Effort should be made to optimize nasal and oropharyngeal patency. - Avoid alcohol, sedatives and other CNS depressants that may worsen sleep apnea and disrupt normal sleep architecture. - Sleep hygiene should be reviewed to assess factors that may improve sleep quality. - Weight management (BMI 31) and regular exercise should be initiated or continued. - Recommend a download and sleep clinic evaluation after 4 weeks of therapy.    ASSESSMENT:    1. OSA (obstructive sleep apnea)   2. Snoring   3. Palpitations   4. PVCs (premature ventricular contractions)     PLAN:  This Jennalyn Cawley is  a very pleasant 66 year old female who is followed by Dr. Gwenlyn Found for cardiology care has a history of palpitations, hypothyroidism mild obesity, was found to have moderately severe obstructive sleep apnea with an AHI of 29.8/h with associated severe oxygen desaturation to a nadir of 74%.  On her initial home study time spent less than 89% was 39.6 minutes.  She underwent CPAP titration and since that time she has been on a CPAP auto mode of 5 to 11 cm of water.  Compliance has been excellent with 100% use with average use at 7-1/2 to 8 hours per night.  AHI is excellent at 2.9 and 2.3 on her prior downloads.  With her 95th percentile pressure at 9.2 I am changing her pressure range to 7 to 13 cm of water.  He feels significantly better since initiating therapy and has had resolution of prior snoring, no energy, and daytime sleepiness.  Epworth Sleepiness Scale score calculated at 1 today arguing against residual daytime sleepiness.  I had a lengthy discussion with her today in the office for education.  I discussed normal sleep architecture and the potential adverse consequences of untreated sleep apnea affecting sleep architecture and her cardiovascular  health.  In particular I discussed its implications regarding blood pressure control, potential for nocturnal arrhythmias and palpitations as well as increased risk for development of atrial fibrillation.  In addition I discussed negative implications regarding insulin resistance, increase in inflammatory markers, as well as increased potential for nocturnal GERD with improvement with CPAP therapy.  With her significant hypoxemia associated with her events I discussed potential nocturnal hypoxemia contributing to nocturnal ischemia both cardiac as well as cerebrovascular particularly if atherosclerosis is present.  She was very appreciative of the education provided.  I encouraged her to continue with her excellent use.  She will return to the cardiology care of Dr. Gwenlyn Found he continues to be on levothyroxine for hypothyroidism. As long as she is stable I will see her in 1 year for reevaluation or sooner as needed.  Medication Adjustments/Labs and Tests Ordered: Current medicines are reviewed at length with the patient today.  Concerns regarding medicines are outlined above.  Medication changes, Labs and Tests ordered today are listed in the Patient Instructions below. Patient Instructions  Follow-Up: At [facility], you and your health needs are our priority.  As part of our continuing mission to provide you with exceptional heart care, we have created designated Provider Care Teams.  These Care Teams include your primary Cardiologist (physician) and Advanced Practice Providers (APPs -  Physician Assistants and Nurse Practitioners) who all work together to provide you with the care you need, when you need it.  We recommend signing up for the patient portal called "MyChart".  Sign up information is provided on this After Visit Summary.  MyChart is used to connect with patients for Virtual Visits (Telemedicine).  Patients are able to view lab/test results, encounter notes, upcoming appointments, etc.   Non-urgent messages can be sent to your provider as well.   To learn more about what you can do with MyChart, go to [email or web address].    Your next appointment:   12 month(s) with Dr. Claiborne Billings (sleep clinic)       Signed, Shelva Majestic, MD, [facility], [place], American Board of Sleep Medicine  04/13/2022 1:50 PM    [facility] [address], [place], [place], [place]  [address] Phone: [phone]

## 2022-04-07 NOTE — Patient Instructions (Signed)
Follow-Up: At [facility], you and your health needs are our priority.  As part of our continuing mission to provide you with exceptional heart care, we have created designated Provider Care Teams.  These Care Teams include your primary Cardiologist (physician) and Advanced Practice Providers (APPs -  Physician Assistants and Nurse Practitioners) who all work together to provide you with the care you need, when you need it.  We recommend signing up for the patient portal called "MyChart".  Sign up information is provided on this After Visit Summary.  MyChart is used to connect with patients for Virtual Visits (Telemedicine).  Patients are able to view lab/test results, encounter notes, upcoming appointments, etc.  Non-urgent messages can be sent to your provider as well.   To learn more about what you can do with MyChart, go to [email or web address].    Your next appointment:   12 month(s) with Dr. Claiborne Billings (sleep clinic)

## 2022-04-09 DIAGNOSIS — G4733 Obstructive sleep apnea (adult) (pediatric): Secondary | ICD-10-CM | POA: Diagnosis not present

## 2022-04-13 ENCOUNTER — Encounter: Payer: Self-pay | Admitting: Cardiovascular Disease

## 2022-04-21 DIAGNOSIS — G4733 Obstructive sleep apnea (adult) (pediatric): Secondary | ICD-10-CM | POA: Diagnosis not present

## 2022-05-24 DIAGNOSIS — G4733 Obstructive sleep apnea (adult) (pediatric): Secondary | ICD-10-CM | POA: Diagnosis not present

## 2022-06-03 DIAGNOSIS — Z1151 Encounter for screening for human papillomavirus (HPV): Secondary | ICD-10-CM | POA: Diagnosis not present

## 2022-06-03 DIAGNOSIS — Z124 Encounter for screening for malignant neoplasm of cervix: Secondary | ICD-10-CM | POA: Diagnosis not present

## 2022-06-03 DIAGNOSIS — Z01419 Encounter for gynecological examination (general) (routine) without abnormal findings: Secondary | ICD-10-CM | POA: Diagnosis not present

## 2022-06-03 DIAGNOSIS — Z1231 Encounter for screening mammogram for malignant neoplasm of breast: Secondary | ICD-10-CM | POA: Diagnosis not present

## 2022-06-03 DIAGNOSIS — Z6833 Body mass index (BMI) 33.0-33.9, adult: Secondary | ICD-10-CM | POA: Diagnosis not present

## 2022-07-16 DIAGNOSIS — K080 Exfoliation of teeth due to systemic causes: Secondary | ICD-10-CM | POA: Diagnosis not present

## 2022-07-31 DIAGNOSIS — G4733 Obstructive sleep apnea (adult) (pediatric): Secondary | ICD-10-CM | POA: Diagnosis not present

## 2022-08-15 ENCOUNTER — Encounter: Payer: Self-pay | Admitting: Family Medicine

## 2022-08-15 ENCOUNTER — Ambulatory Visit (INDEPENDENT_AMBULATORY_CARE_PROVIDER_SITE_OTHER): Payer: Medicare Other | Admitting: Family Medicine

## 2022-08-15 VITALS — BP 130/77 | HR 63 | Ht 64.0 in | Wt 190.0 lb

## 2022-08-15 DIAGNOSIS — F419 Anxiety disorder, unspecified: Secondary | ICD-10-CM | POA: Diagnosis not present

## 2022-08-15 DIAGNOSIS — Z79899 Other long term (current) drug therapy: Secondary | ICD-10-CM | POA: Diagnosis not present

## 2022-08-15 DIAGNOSIS — F3341 Major depressive disorder, recurrent, in partial remission: Secondary | ICD-10-CM

## 2022-08-15 DIAGNOSIS — E039 Hypothyroidism, unspecified: Secondary | ICD-10-CM | POA: Diagnosis not present

## 2022-08-15 DIAGNOSIS — K760 Fatty (change of) liver, not elsewhere classified: Secondary | ICD-10-CM

## 2022-08-15 MED ORDER — ALPRAZOLAM 0.25 MG PO TABS: 0.5000 mg | ORAL_TABLET | Freq: Every evening | ORAL | 1 refills | Status: DC | PRN

## 2022-08-15 MED ORDER — FLUOXETINE HCL 20 MG PO CAPS: 20.0000 mg | ORAL_CAPSULE | Freq: Every day | ORAL | 3 refills | Status: DC

## 2022-08-15 NOTE — Progress Notes (Signed)
BP 130/77   Pulse 63   Ht 5' 4"$  (1.626 m)   Wt 190 lb (86.2 kg)   LMP 11/26/2010   SpO2 96%   BMI 32.61 kg/m    Subjective:   Patient ID: Kayla Romero, female    DOB: [birth year], 67 y.o.   MRN: [id number]  HPI: Kayla Romero is a 67 y.o. female presenting on 08/15/2022 for Medical Management of Chronic Issues, Hypothyroidism, Anxiety, Depression, scalp pain, and Neck Pain   HPI Hypothyroidism recheck Patient is coming in for thyroid recheck today as well. They deny any issues with hair changes or heat or cold problems or diarrhea or constipation. They deny any chest pain or palpitations. They are currently on levothyroxine 50 micrograms   Patient also comes in complaining of some neck pain right side of her neck that is been bothering her over the past week or 2.  She says it has started about the same time that she had a dental infection but she does feel like the dental infection is better now and was treated with surgery and antibiotics and she still just has a lot of neck tightness on that right side of her neck.  She denies any numbness or weakness [employer].  She has tried using Tylenol and ibuprofen to help some.  She is also had a little bit of congestion and has been using Flonase nasal saline but does not necessarily know if that is correlated to the neck tightness and pain.  She says it does hurt worse when she sleeps especially since she likes to sleep on that side.  She has been trying to go to the gym and exercise but it does hurt with some of the aerobic exercises she does    6 anxiety depression recheck Patient is coming in today for anxiety depression recheck.  She currently uses Prozac every day and then the occasional alprazolam just when she needs it.  She does feel like her anxiety has gradually been increasing over the past year.  She does have a little bit more sleeping issues some because of the pain in her neck but some because of the anxiety as  well. Current rx-alprazolam 0.25, take 2 tablets nightly as needed for panic attacks # meds rx-60/month Effectiveness of current meds-works well, rarely has to use Adverse reactions form meds-none  Pill count performed-No Last drug screen -08/30/2021 ( high risk q94m moderate risk q652mlow risk yearly ) Urine drug screen today- Yes Was the NCSal[place]eviewed-yes  If yes were their any concerning findings? -Occasional pain prescription with surgery, she recommended that she not interact them.  No flowsheet data found.   Controlled substance contract signed on: Today  Relevant past medical, surgical, family and social history reviewed and updated as indicated. Interim medical history since our last visit reviewed. Allergies and medications reviewed and updated.  Review of Systems  Constitutional:  Negative for chills and fever.  Eyes:  Negative for visual disturbance.  Respiratory:  Negative for chest tightness and shortness of breath.   Cardiovascular:  Negative for chest pain and leg swelling.  Musculoskeletal:  Positive for arthralgias and neck pain. Negative for back pain and gait problem.  Skin:  Negative for rash.  Neurological:  Negative for dizziness, light-headedness and headaches.  Psychiatric/Behavioral:  Positive for dysphoric mood and sleep disturbance. Negative for agitation, behavioral problems, self-injury and suicidal ideas. The patient is nervous/anxious.   All other systems reviewed and are negative.  Per HPI unless specifically indicated above   Allergies as of 08/15/2022   No Known Allergies      Medication List        Accurate as of August 15, 2022 11:15 AM. If you have any questions, ask your nurse or doctor.          ALPRAZolam 0.25 MG tablet Commonly known as: XANAX Take 2 tablets (0.5 mg total) by mouth at bedtime as needed (panic attack).   FLUoxetine 20 MG capsule Commonly known as: PROZAC Take 1 capsule (20 mg total) by mouth  daily. What changed:  medication strength how much to take Changed by: Fransisca Kaufmann Eswin Worrell, MD   fluticasone 50 MCG/ACT nasal spray Commonly known as: FLONASE SPRAY 2 SPRAYS INTO EACH NOSTRIL EVERY DAY   levothyroxine 50 MCG tablet Commonly known as: SYNTHROID TAKE 1 TABLET BY MOUTH EVERY DAY   Magnesium 100 MG Tabs Take 100 mg by mouth in the morning and at bedtime.   SYSTANE ULTRA OP Place 1 drop into both eyes every evening.   Vitamin D3 50 MCG (2000 UT) Tabs Generic drug: Cholecalciferol Take 2,000 Units by mouth in the morning.         Objective:   BP 130/77   Pulse 63   Ht 5' 4"$  (1.626 m)   Wt 190 lb (86.2 kg)   LMP 11/26/2010   SpO2 96%   BMI 32.61 kg/m   Wt Readings from Last 3 Encounters:  08/15/22 190 lb (86.2 kg)  04/07/22 181 lb (82.1 kg)  03/25/22 180 lb (81.6 kg)    Physical Exam Vitals and nursing note reviewed.  Constitutional:      General: She is not in acute distress.    Appearance: She is well-developed. She is not diaphoretic.  Eyes:     Conjunctiva/sclera: Conjunctivae normal.  Cardiovascular:     Rate and Rhythm: Normal rate and regular rhythm.     Heart sounds: Normal heart sounds. No murmur heard. Pulmonary:     Effort: Pulmonary effort is normal. No respiratory distress.     Breath sounds: Normal breath sounds. No wheezing.  Musculoskeletal:        General: No swelling. Normal range of motion.     Cervical back: Tenderness (Right-sided muscular tenderness) present.  Skin:    General: Skin is warm and dry.     Findings: No rash.  Neurological:     Mental Status: She is alert and oriented to person, place, and time.     Coordination: Coordination normal.  Psychiatric:        Behavior: Behavior normal.       Assessment & Plan:   Problem List Items Addressed This Visit       Digestive   Fatty liver   Relevant Orders   CBC with Differential/Platelet   CMP14+EGFR   Lipid panel     Endocrine   Acquired  hypothyroidism - Primary   Relevant Orders   CBC with Differential/Platelet   TSH     Other   Anxiety   Relevant Medications   ALPRAZolam (XANAX) 0.25 MG tablet   FLUoxetine (PROZAC) 20 MG capsule   Other Relevant Orders   CBC with Differential/Platelet   Depression   Relevant Medications   ALPRAZolam (XANAX) 0.25 MG tablet   FLUoxetine (PROZAC) 20 MG capsule   Other Visit Diagnoses     Controlled substance agreement signed       Relevant Medications   ALPRAZolam (XANAX) 0.25 MG tablet  Other Relevant Orders   ToxASSURE Select 13 (MW), Urine       Increased Prozac to see if helps a little bit more with her anxiety.  Will check blood work today.  Also did urine drug screen.  She rarely uses her benzodiazepine and recommended to continue with infrequent use.  For next sprain or strain recommended a list of exercises that she can do and heating pad and TENS unit and Tylenol (which she has been doing   Follow up plan: Return in about 6 months (around 02/13/2023), or if symptoms worsen or fail to improve, for Anxiety and thyroid recheck.  Counseling provided for all of the vaccine components Orders Placed This Encounter  Procedures   CBC with Differential/Platelet   CMP14+EGFR   Lipid panel   TSH   ToxASSURE Select 13 (MW), Urine    Caryl Pina, MD [facility] 08/15/2022, 11:15 AM

## 2022-08-16 LAB — CMP14+EGFR
ALT: 48 IU/L — ABNORMAL HIGH (ref 0–32)
AST: 39 IU/L (ref 0–40)
Albumin/Globulin Ratio: 1.9 (ref 1.2–2.2)
Albumin: 4.6 g/dL (ref 3.9–4.9)
Alkaline Phosphatase: 139 IU/L — ABNORMAL HIGH (ref 44–121)
BUN/Creatinine Ratio: 21 (ref 12–28)
BUN: 15 mg/dL (ref 8–27)
Bilirubin Total: 0.5 mg/dL (ref 0.0–1.2)
CO2: 23 mmol/L (ref 20–29)
Calcium: 9.4 mg/dL (ref 8.7–10.3)
Chloride: 102 mmol/L (ref 96–106)
Creatinine, Ser: 0.7 mg/dL (ref 0.57–1.00)
Globulin, Total: 2.4 g/dL (ref 1.5–4.5)
Glucose: 85 mg/dL (ref 70–99)
Potassium: 4.5 mmol/L (ref 3.5–5.2)
Sodium: 141 mmol/L (ref 134–144)
Total Protein: 7 g/dL (ref 6.0–8.5)
eGFR: 96 mL/min/{1.73_m2} (ref >59)

## 2022-08-16 LAB — CBC WITH DIFFERENTIAL/PLATELET
Basophils Absolute: 0.1 10*3/uL (ref 0.0–0.2)
Basos: 1 %
EOS (ABSOLUTE): 0.1 10*3/uL (ref 0.0–0.4)
Eos: 2 %
Hematocrit: 39.4 % (ref 34.0–46.6)
Hemoglobin: 13.3 g/dL (ref 11.1–15.9)
Immature Grans (Abs): 0 10*3/uL (ref 0.0–0.1)
Immature Granulocytes: 0 %
Lymphocytes Absolute: 2.3 10*3/uL (ref 0.7–3.1)
Lymphs: 38 %
MCH: 30.4 pg (ref 26.6–33.0)
MCHC: 33.8 g/dL (ref 31.5–35.7)
MCV: 90 fL (ref 79–97)
Monocytes Absolute: 0.5 10*3/uL (ref 0.1–0.9)
Monocytes: 8 %
Neutrophils Absolute: 3.1 10*3/uL (ref 1.4–7.0)
Neutrophils: 51 %
Platelets: 298 10*3/uL (ref 150–450)
RBC: 4.38 x10E6/uL (ref 3.77–5.28)
RDW: 12.8 % (ref 11.7–15.4)
WBC: 6.1 10*3/uL (ref 3.4–10.8)

## 2022-08-16 LAB — LIPID PANEL
Chol/HDL Ratio: 3.9 ratio (ref 0.0–4.4)
Cholesterol, Total: 221 mg/dL — ABNORMAL HIGH (ref 100–199)
HDL: 56 mg/dL (ref >39)
LDL Chol Calc (NIH): 136 mg/dL — ABNORMAL HIGH (ref 0–99)
Triglycerides: 162 mg/dL — ABNORMAL HIGH (ref 0–149)
VLDL Cholesterol Cal: 29 mg/dL (ref 5–40)

## 2022-08-16 LAB — TSH: TSH: 2.61 u[IU]/mL (ref 0.450–4.500)

## 2022-08-19 DIAGNOSIS — K080 Exfoliation of teeth due to systemic causes: Secondary | ICD-10-CM | POA: Diagnosis not present

## 2022-08-20 LAB — TOXASSURE SELECT 13 (MW), URINE

## 2022-08-25 DIAGNOSIS — M9902 Segmental and somatic dysfunction of thoracic region: Secondary | ICD-10-CM | POA: Diagnosis not present

## 2022-08-25 DIAGNOSIS — S233XXA Sprain of ligaments of thoracic spine, initial encounter: Secondary | ICD-10-CM | POA: Diagnosis not present

## 2022-08-25 DIAGNOSIS — S134XXA Sprain of ligaments of cervical spine, initial encounter: Secondary | ICD-10-CM | POA: Diagnosis not present

## 2022-08-25 DIAGNOSIS — M9901 Segmental and somatic dysfunction of cervical region: Secondary | ICD-10-CM | POA: Diagnosis not present

## 2022-08-28 DIAGNOSIS — M9902 Segmental and somatic dysfunction of thoracic region: Secondary | ICD-10-CM | POA: Diagnosis not present

## 2022-08-28 DIAGNOSIS — S233XXA Sprain of ligaments of thoracic spine, initial encounter: Secondary | ICD-10-CM | POA: Diagnosis not present

## 2022-08-28 DIAGNOSIS — S134XXA Sprain of ligaments of cervical spine, initial encounter: Secondary | ICD-10-CM | POA: Diagnosis not present

## 2022-08-28 DIAGNOSIS — M9901 Segmental and somatic dysfunction of cervical region: Secondary | ICD-10-CM | POA: Diagnosis not present

## 2022-09-03 DIAGNOSIS — S134XXA Sprain of ligaments of cervical spine, initial encounter: Secondary | ICD-10-CM | POA: Diagnosis not present

## 2022-09-03 DIAGNOSIS — M9902 Segmental and somatic dysfunction of thoracic region: Secondary | ICD-10-CM | POA: Diagnosis not present

## 2022-09-03 DIAGNOSIS — M9901 Segmental and somatic dysfunction of cervical region: Secondary | ICD-10-CM | POA: Diagnosis not present

## 2022-09-03 DIAGNOSIS — S233XXA Sprain of ligaments of thoracic spine, initial encounter: Secondary | ICD-10-CM | POA: Diagnosis not present

## 2022-09-10 DIAGNOSIS — S233XXA Sprain of ligaments of thoracic spine, initial encounter: Secondary | ICD-10-CM | POA: Diagnosis not present

## 2022-09-10 DIAGNOSIS — M9902 Segmental and somatic dysfunction of thoracic region: Secondary | ICD-10-CM | POA: Diagnosis not present

## 2022-09-10 DIAGNOSIS — S134XXA Sprain of ligaments of cervical spine, initial encounter: Secondary | ICD-10-CM | POA: Diagnosis not present

## 2022-09-10 DIAGNOSIS — M9901 Segmental and somatic dysfunction of cervical region: Secondary | ICD-10-CM | POA: Diagnosis not present

## 2022-09-16 ENCOUNTER — Encounter: Payer: Self-pay | Admitting: Family Medicine

## 2022-09-17 DIAGNOSIS — H353132 Nonexudative age-related macular degeneration, bilateral, intermediate dry stage: Secondary | ICD-10-CM | POA: Diagnosis not present

## 2022-09-17 DIAGNOSIS — H35372 Puckering of macula, left eye: Secondary | ICD-10-CM | POA: Diagnosis not present

## 2022-09-17 DIAGNOSIS — H3509 Other intraretinal microvascular abnormalities: Secondary | ICD-10-CM | POA: Diagnosis not present

## 2022-09-24 DIAGNOSIS — M9901 Segmental and somatic dysfunction of cervical region: Secondary | ICD-10-CM | POA: Diagnosis not present

## 2022-09-24 DIAGNOSIS — S233XXA Sprain of ligaments of thoracic spine, initial encounter: Secondary | ICD-10-CM | POA: Diagnosis not present

## 2022-09-24 DIAGNOSIS — M9902 Segmental and somatic dysfunction of thoracic region: Secondary | ICD-10-CM | POA: Diagnosis not present

## 2022-09-24 DIAGNOSIS — S134XXA Sprain of ligaments of cervical spine, initial encounter: Secondary | ICD-10-CM | POA: Diagnosis not present

## 2022-10-01 DIAGNOSIS — M9902 Segmental and somatic dysfunction of thoracic region: Secondary | ICD-10-CM | POA: Diagnosis not present

## 2022-10-01 DIAGNOSIS — S134XXA Sprain of ligaments of cervical spine, initial encounter: Secondary | ICD-10-CM | POA: Diagnosis not present

## 2022-10-01 DIAGNOSIS — S233XXA Sprain of ligaments of thoracic spine, initial encounter: Secondary | ICD-10-CM | POA: Diagnosis not present

## 2022-10-01 DIAGNOSIS — M9901 Segmental and somatic dysfunction of cervical region: Secondary | ICD-10-CM | POA: Diagnosis not present

## 2022-10-08 DIAGNOSIS — S134XXA Sprain of ligaments of cervical spine, initial encounter: Secondary | ICD-10-CM | POA: Diagnosis not present

## 2022-10-08 DIAGNOSIS — S233XXA Sprain of ligaments of thoracic spine, initial encounter: Secondary | ICD-10-CM | POA: Diagnosis not present

## 2022-10-08 DIAGNOSIS — M9902 Segmental and somatic dysfunction of thoracic region: Secondary | ICD-10-CM | POA: Diagnosis not present

## 2022-10-08 DIAGNOSIS — M9901 Segmental and somatic dysfunction of cervical region: Secondary | ICD-10-CM | POA: Diagnosis not present

## 2022-10-15 ENCOUNTER — Ambulatory Visit (INDEPENDENT_AMBULATORY_CARE_PROVIDER_SITE_OTHER): Payer: Medicare Other | Admitting: Family Medicine

## 2022-10-15 ENCOUNTER — Encounter: Payer: Self-pay | Admitting: Family Medicine

## 2022-10-15 VITALS — BP 129/74 | HR 57 | Ht 64.0 in | Wt 187.0 lb

## 2022-10-15 DIAGNOSIS — M9902 Segmental and somatic dysfunction of thoracic region: Secondary | ICD-10-CM | POA: Diagnosis not present

## 2022-10-15 DIAGNOSIS — M9901 Segmental and somatic dysfunction of cervical region: Secondary | ICD-10-CM | POA: Diagnosis not present

## 2022-10-15 DIAGNOSIS — S233XXA Sprain of ligaments of thoracic spine, initial encounter: Secondary | ICD-10-CM | POA: Diagnosis not present

## 2022-10-15 DIAGNOSIS — Z0000 Encounter for general adult medical examination without abnormal findings: Secondary | ICD-10-CM | POA: Diagnosis not present

## 2022-10-15 DIAGNOSIS — S134XXA Sprain of ligaments of cervical spine, initial encounter: Secondary | ICD-10-CM | POA: Diagnosis not present

## 2022-10-15 NOTE — Progress Notes (Signed)
Subjective:    Kayla Romero is a 67 y.o. female who presents for a Welcome to Medicare exam.   Review of Systems Review of Systems  Constitutional:  Negative for chills and fever.  HENT:  Negative for ear pain and tinnitus.   Eyes:  Negative for blurred vision and pain.  Respiratory:  Negative for cough, shortness of breath and wheezing.   Cardiovascular:  Negative for chest pain, palpitations and leg swelling.  Gastrointestinal:  Negative for abdominal pain, blood in stool, constipation, diarrhea and melena.  Genitourinary:  Negative for dysuria and hematuria.  Musculoskeletal:  Negative for back pain, joint pain and myalgias.  Skin:  Negative for rash.  Neurological:  Negative for dizziness, sensory change, focal weakness, weakness and headaches.  Psychiatric/Behavioral:  Negative for depression and suicidal ideas.     Cardiac Risk Factors include: advanced age (>70men, >83 women);obesity (BMI >30kg/m2) The 10-year ASCVD risk score (Arnett DK, et al., 2019) is: 5.9%   Values used to calculate the score:     Age: 41 years     Sex: Female     Is Non-Hispanic African American: No     Diabetic: No     Tobacco smoker: No     Systolic Blood Pressure: 129 mmHg     Is BP treated: No     HDL Cholesterol: 56 mg/dL     Total Cholesterol: 221 mg/dL       Objective:    Today's Vitals   10/15/22 1035  BP: 129/74  Pulse: (!) 57  SpO2: 96%  Weight: 187 lb (84.8 kg)  Height:  (1.626 m)  Body mass index is 32.1 kg/m.  Medications Outpatient Encounter Medications as of 10/15/2022  Medication Sig   ALPRAZolam (XANAX) 0.25 MG tablet Take 2 tablets (0.5 mg total) by mouth at bedtime as needed (panic attack).   Cholecalciferol (VITAMIN D3) 50 MCG (2000 UT) TABS Take 2,000 Units by mouth in the morning.   FLUoxetine (PROZAC) 20 MG capsule Take 1 capsule (20 mg total) by mouth daily.   fluticasone (FLONASE) 50 MCG/ACT nasal spray SPRAY 2 SPRAYS INTO EACH NOSTRIL EVERY DAY    levothyroxine (SYNTHROID) 50 MCG tablet TAKE 1 TABLET BY MOUTH EVERY DAY   Magnesium 100 MG TABS Take 100 mg by mouth in the morning and at bedtime.   Polyethyl Glycol-Propyl Glycol (SYSTANE ULTRA OP) Place 1 drop into both eyes every evening.   No facility-administered encounter medications on file as of 10/15/2022.     History: Past Medical History:  Diagnosis Date   Anxiety    h/o, recently has not had to use xanax much.   Cyst of thymus gland    being monitored   Depression    Dysrhythmia 2021   PVC   Fatty liver    History of EKG    first presented /w L shoulder pain, 2007, seen by [facility] , had EKG then & it was wnl.     Macular degeneration    Mediastinal mass    Obesity (BMI 30-39.9)    PVC's (premature ventricular contractions)    RUQ pain    Thymus, cyst    first seen on 2007, had some care with Dr. Edwyna Shell & then a Octavio Manns MD fr. [facility]     Thymus, cyst    diagnosed approx. 2007, seen by Dr. Edwyna Shell, but now followed by [facility] in [place]   Vitamin D deficiency    Past Surgical  History:  Procedure Laterality Date   CHOLECYSTECTOMY N/A 08/31/2012   Procedure: LAPAROSCOPIC CHOLECYSTECTOMY possible IOC ;  Surgeon: Emelia Loron, MD;  Location: [facility] OR;  Service: General;  Laterality: N/A;   EYE SURGERY  2008   bilateral removal of cataracts, /w IOL   KNEE ARTHROSCOPY WITH MEDIAL MENISECTOMY Left 09/09/2021   Procedure: KNEE ARTHROSCOPY WITH PARTIAL MEDIAL MENISECTOMY;  Surgeon: Yolonda Kida, MD;  Location: [facility];  Service: Orthopedics;  Laterality: Left;   TUBAL LIGATION  1988    Family History  Problem Relation Age of Onset   Heart disease Mother    Cirrhosis Mother    Kidney disease Mother    Cancer Mother        ovarian   Aneurysm Father    Social History   Occupational History   Not on file  Tobacco Use   Smoking status: Former    Packs/day: 1.00    Years: 20.00    Additional pack years: 0.00     Total pack years: 20.00    Types: Cigarettes    Quit date: 05/31/2015    Years since quitting: 7.3   Smokeless tobacco: Never   Tobacco comments:    quit 2 weeks ago completely. had quit in 2012 but restarted  Vaping Use   Vaping Use: Never used  Substance and Sexual Activity   Alcohol use: No   Drug use: No   Sexual activity: Not on file    Tobacco Counseling Counseling given: Not Answered Tobacco comments: quit 2 weeks ago completely. had quit in 2012 but restarted   Immunizations and Health Maintenance Immunization History  Administered Date(s) Administered   DTaP 05/02/1962, 12/29/1962   Fluad Quad(high Dose 65+) 04/14/2022   Hepatitis B 10/02/2017, 10/02/2017, 01/22/2018   Hepatitis B, ADULT 01/22/2018   IPV 05/06/1961, 06/03/1961, 12/30/1961, 12/30/1962   Influenza, Quadrivalent, Recombinant, Inj, Pf 04/21/2019   Influenza,inj,Quad PF,6+ Mos 04/20/2014, 04/12/2015, 04/21/2016, 04/01/2017, 04/07/2018, 04/18/2020, 04/03/2021   Influenza-Unspecified 04/30/2013, 04/01/2017, 04/07/2018   MMR 09/04/2017, 10/05/2017   Moderna SARS-COV2 Booster Vaccination 01/26/2021   Moderna Sars-Covid-2 Vaccination 09/08/2019, 10/08/2019, 05/19/2020   Pneumococcal Conjugate-13 02/26/2022   Smallpox 05/06/1961   Tdap 09/04/2017   Vaccinia,smallpox Monkeypox Vaccine Live,pf 05/06/1961   Zoster Recombinat (Shingrix) 02/12/2021, 07/11/2021   Health Maintenance Due  Topic Date Due   COVID-19 Vaccine (4 - 2023-24 season) 02/28/2022    Activities of Daily Living    10/15/2022   10:43 AM  In your present state of health, do you have any difficulty performing the following activities:  Hearing? 0  Vision? 0  Difficulty concentrating or making decisions? 0  Walking or climbing stairs? 0  Dressing or bathing? 0  Doing errands, shopping? 0  Preparing Food and eating ? N  Using the Toilet? N  In the past six months, have you accidently leaked urine? N  Do you have problems with loss of  bowel control? N  Managing your Medications? N  Managing your Finances? N  Housekeeping or managing your Housekeeping? N    Physical Exam  (optional), or other factors deemed appropriate based on the beneficiary's medical and social history and current clinical standards.  Advanced Directives: Does Patient Have a Medical Advance Directive?: No Would patient like information on creating a medical advance directive?: No - Patient declined    Assessment:    This is a routine wellness examination for this patient .   Vision/Hearing screen No results found.  Dietary issues and exercise  activities discussed:  Current Exercise Habits: Structured exercise class, Type of exercise: strength training/weights;stretching;walking, Time (Minutes): 60, Frequency (Times/Week): 3, Weekly Exercise (Minutes/Week): 180, Intensity: Moderate, Exercise limited by: None identified   Goals   None   Depression Screen    10/15/2022   10:35 AM 08/15/2022   10:45 AM 02/12/2022   10:37 AM 08/15/2021   10:19 AM  PHQ 2/9 Scores  PHQ - 2 Score 0 0  0  PHQ- 9 Score    3  Exception Documentation   Patient refusal      Fall Risk    10/15/2022   10:35 AM  Fall Risk   Falls in the past year? 0    Cognitive Function:        10/15/2022   10:44 AM  6CIT Screen  What Year? 0 points  What month? 0 points  What time? 0 points  Count back from 20 0 points  Months in reverse 0 points  Repeat phrase 0 points  Total Score 0 points    Patient Care Team: Artemisa Sladek, Elige Radon, MD as PCP - General (Family Medicine) Runell Gess, MD as PCP - Cardiology (Cardiology)     Plan:    Problem List Items Addressed This Visit   None Visit Diagnoses     Encounter for Medicare annual wellness exam    -  Primary        I have personally reviewed and noted the following in the patient's chart:   Medical and social history Use of alcohol, tobacco or illicit drugs  Current medications and  supplements Functional ability and status Nutritional status Physical activity Advanced directives List of other physicians Hospitalizations, surgeries, and ER visits in previous 12 months Vitals Screenings to include cognitive, depression, and falls Referrals and appointments  In addition, I have reviewed and discussed with patient certain preventive protocols, quality metrics, and best practice recommendations. A written personalized care plan for preventive services as well as general preventive health recommendations were provided to patient.     Nils Pyle, MD 10/15/2022

## 2022-10-24 DIAGNOSIS — S134XXA Sprain of ligaments of cervical spine, initial encounter: Secondary | ICD-10-CM | POA: Diagnosis not present

## 2022-10-24 DIAGNOSIS — M9902 Segmental and somatic dysfunction of thoracic region: Secondary | ICD-10-CM | POA: Diagnosis not present

## 2022-10-24 DIAGNOSIS — S233XXA Sprain of ligaments of thoracic spine, initial encounter: Secondary | ICD-10-CM | POA: Diagnosis not present

## 2022-10-24 DIAGNOSIS — M9901 Segmental and somatic dysfunction of cervical region: Secondary | ICD-10-CM | POA: Diagnosis not present

## 2022-10-28 DIAGNOSIS — H353112 Nonexudative age-related macular degeneration, right eye, intermediate dry stage: Secondary | ICD-10-CM | POA: Diagnosis not present

## 2022-10-29 DIAGNOSIS — K080 Exfoliation of teeth due to systemic causes: Secondary | ICD-10-CM | POA: Diagnosis not present

## 2022-11-02 ENCOUNTER — Other Ambulatory Visit: Payer: Self-pay | Admitting: Cardiovascular Disease

## 2022-11-05 DIAGNOSIS — G4733 Obstructive sleep apnea (adult) (pediatric): Secondary | ICD-10-CM | POA: Diagnosis not present

## 2022-11-07 DIAGNOSIS — S134XXA Sprain of ligaments of cervical spine, initial encounter: Secondary | ICD-10-CM | POA: Diagnosis not present

## 2022-11-07 DIAGNOSIS — S233XXA Sprain of ligaments of thoracic spine, initial encounter: Secondary | ICD-10-CM | POA: Diagnosis not present

## 2022-11-07 DIAGNOSIS — M9902 Segmental and somatic dysfunction of thoracic region: Secondary | ICD-10-CM | POA: Diagnosis not present

## 2022-11-07 DIAGNOSIS — M9901 Segmental and somatic dysfunction of cervical region: Secondary | ICD-10-CM | POA: Diagnosis not present

## 2022-11-10 DIAGNOSIS — M79642 Pain in left hand: Secondary | ICD-10-CM | POA: Diagnosis not present

## 2022-11-10 DIAGNOSIS — M65312 Trigger thumb, left thumb: Secondary | ICD-10-CM | POA: Diagnosis not present

## 2022-11-27 DIAGNOSIS — H353132 Nonexudative age-related macular degeneration, bilateral, intermediate dry stage: Secondary | ICD-10-CM | POA: Diagnosis not present

## 2022-12-01 DIAGNOSIS — M9902 Segmental and somatic dysfunction of thoracic region: Secondary | ICD-10-CM | POA: Diagnosis not present

## 2022-12-01 DIAGNOSIS — S233XXA Sprain of ligaments of thoracic spine, initial encounter: Secondary | ICD-10-CM | POA: Diagnosis not present

## 2022-12-01 DIAGNOSIS — M9901 Segmental and somatic dysfunction of cervical region: Secondary | ICD-10-CM | POA: Diagnosis not present

## 2022-12-01 DIAGNOSIS — S134XXA Sprain of ligaments of cervical spine, initial encounter: Secondary | ICD-10-CM | POA: Diagnosis not present

## 2022-12-08 DIAGNOSIS — M65312 Trigger thumb, left thumb: Secondary | ICD-10-CM | POA: Diagnosis not present

## 2022-12-22 DIAGNOSIS — S134XXA Sprain of ligaments of cervical spine, initial encounter: Secondary | ICD-10-CM | POA: Diagnosis not present

## 2022-12-22 DIAGNOSIS — M9902 Segmental and somatic dysfunction of thoracic region: Secondary | ICD-10-CM | POA: Diagnosis not present

## 2022-12-22 DIAGNOSIS — M9901 Segmental and somatic dysfunction of cervical region: Secondary | ICD-10-CM | POA: Diagnosis not present

## 2022-12-22 DIAGNOSIS — S233XXA Sprain of ligaments of thoracic spine, initial encounter: Secondary | ICD-10-CM | POA: Diagnosis not present

## 2022-12-27 DIAGNOSIS — H353132 Nonexudative age-related macular degeneration, bilateral, intermediate dry stage: Secondary | ICD-10-CM | POA: Diagnosis not present

## 2023-01-16 DIAGNOSIS — M9901 Segmental and somatic dysfunction of cervical region: Secondary | ICD-10-CM | POA: Diagnosis not present

## 2023-01-16 DIAGNOSIS — S134XXA Sprain of ligaments of cervical spine, initial encounter: Secondary | ICD-10-CM | POA: Diagnosis not present

## 2023-01-16 DIAGNOSIS — M9902 Segmental and somatic dysfunction of thoracic region: Secondary | ICD-10-CM | POA: Diagnosis not present

## 2023-01-16 DIAGNOSIS — S233XXA Sprain of ligaments of thoracic spine, initial encounter: Secondary | ICD-10-CM | POA: Diagnosis not present

## 2023-01-26 DIAGNOSIS — H353132 Nonexudative age-related macular degeneration, bilateral, intermediate dry stage: Secondary | ICD-10-CM | POA: Diagnosis not present

## 2023-01-29 DIAGNOSIS — K080 Exfoliation of teeth due to systemic causes: Secondary | ICD-10-CM | POA: Diagnosis not present

## 2023-01-30 DIAGNOSIS — G4733 Obstructive sleep apnea (adult) (pediatric): Secondary | ICD-10-CM | POA: Diagnosis not present

## 2023-02-10 DIAGNOSIS — M65312 Trigger thumb, left thumb: Secondary | ICD-10-CM | POA: Diagnosis not present

## 2023-02-13 DIAGNOSIS — M9902 Segmental and somatic dysfunction of thoracic region: Secondary | ICD-10-CM | POA: Diagnosis not present

## 2023-02-13 DIAGNOSIS — M9901 Segmental and somatic dysfunction of cervical region: Secondary | ICD-10-CM | POA: Diagnosis not present

## 2023-02-13 DIAGNOSIS — S233XXA Sprain of ligaments of thoracic spine, initial encounter: Secondary | ICD-10-CM | POA: Diagnosis not present

## 2023-02-13 DIAGNOSIS — S134XXA Sprain of ligaments of cervical spine, initial encounter: Secondary | ICD-10-CM | POA: Diagnosis not present

## 2023-02-18 ENCOUNTER — Encounter: Payer: Self-pay | Admitting: Family Medicine

## 2023-02-18 ENCOUNTER — Ambulatory Visit (INDEPENDENT_AMBULATORY_CARE_PROVIDER_SITE_OTHER): Payer: Medicare Other | Admitting: Family Medicine

## 2023-02-18 VITALS — BP 117/76 | HR 60 | Ht 64.0 in | Wt 185.0 lb

## 2023-02-18 DIAGNOSIS — F3341 Major depressive disorder, recurrent, in partial remission: Secondary | ICD-10-CM

## 2023-02-18 DIAGNOSIS — E039 Hypothyroidism, unspecified: Secondary | ICD-10-CM | POA: Diagnosis not present

## 2023-02-18 DIAGNOSIS — Z79899 Other long term (current) drug therapy: Secondary | ICD-10-CM | POA: Diagnosis not present

## 2023-02-18 DIAGNOSIS — F17210 Nicotine dependence, cigarettes, uncomplicated: Secondary | ICD-10-CM

## 2023-02-18 DIAGNOSIS — F419 Anxiety disorder, unspecified: Secondary | ICD-10-CM

## 2023-02-18 DIAGNOSIS — Z23 Encounter for immunization: Secondary | ICD-10-CM | POA: Diagnosis not present

## 2023-02-18 DIAGNOSIS — K760 Fatty (change of) liver, not elsewhere classified: Secondary | ICD-10-CM

## 2023-02-18 MED ORDER — ALPRAZOLAM 0.25 MG PO TABS: 0.5000 mg | ORAL_TABLET | Freq: Every evening | ORAL | 1 refills | Status: DC | PRN

## 2023-02-18 MED ORDER — LEVOTHYROXINE SODIUM 50 MCG PO TABS: 50.0000 ug | ORAL_TABLET | Freq: Every day | ORAL | 3 refills | Status: DC

## 2023-02-18 NOTE — Progress Notes (Signed)
BP 117/76   Pulse 60   Ht 5\' 4"  (1.626 m)   Wt 185 lb (83.9 kg)   LMP 11/26/2010   SpO2 97%   BMI 31.76 kg/m    Subjective:   Patient ID: Kayla Romero, female    DOB: 07/12/1955, 67 y.o.   MRN: [id number]  HPI: Kayla Romero is a 67 y.o. female presenting on 02/18/2023 for Medical Management of Chronic Issues, Anxiety, and Hypothyroidism   HPI Hypothyroidism recheck Patient is coming in for thyroid recheck today as well. They deny any issues with hair changes or heat or cold problems or diarrhea or constipation. They deny any chest pain or palpitations. They are currently on levothyroxine 50 micrograms   Anxiety and depression recheck Current rx-Prozac and alprazolam 0.25 mg, take 1 to 2 tablets nightly as needed # meds rx-60/month Effectiveness of current meds-works well, does not use every day or all of the time Adverse reactions form meds-none Pill count performed-No Last drug screen -08/29/2022 ( high risk q65m, moderate risk q90m, low risk yearly ) Urine drug screen today- No Was the NCCSR reviewed-yes  If yes were their any concerning findings? -None Controlled substance contract signed on: 08/29/2022  Relevant past medical, surgical, family and social history reviewed and updated as indicated. Interim medical history since our last visit reviewed. Allergies and medications reviewed and updated.  Review of Systems  Constitutional:  Negative for chills and fever.  Eyes:  Negative for redness and visual disturbance.  Respiratory:  Negative for chest tightness and shortness of breath.   Cardiovascular:  Negative for chest pain and leg swelling.  Genitourinary:  Negative for difficulty urinating and dysuria.  Musculoskeletal:  Negative for back pain and gait problem.  Skin:  Negative for rash.  Neurological:  Negative for light-headedness and headaches.  Psychiatric/Behavioral:  Negative for agitation, behavioral problems, dysphoric mood, self-injury, sleep disturbance  and suicidal ideas. The patient is not nervous/anxious.   All other systems reviewed and are negative.   Per HPI unless specifically indicated above   Allergies as of 02/18/2023   No Known Allergies      Medication List        Accurate as of February 18, 2023 10:41 AM. If you have any questions, ask your nurse or doctor.          STOP taking these medications    Magnesium 100 MG Tabs Stopped by: Elige Radon Analycia Khokhar       TAKE these medications    ALPRAZolam 0.25 MG tablet Commonly known as: XANAX Take 2 tablets (0.5 mg total) by mouth at bedtime as needed (panic attack).   FLUoxetine 20 MG capsule Commonly known as: PROZAC Take 1 capsule (20 mg total) by mouth daily.   fluticasone 50 MCG/ACT nasal spray Commonly known as: FLONASE SPRAY 2 SPRAYS INTO EACH NOSTRIL EVERY DAY   levothyroxine 50 MCG tablet Commonly known as: SYNTHROID Take 1 tablet (50 mcg total) by mouth daily.   SYSTANE ULTRA OP Place 1 drop into both eyes every evening.   Vitamin D3 50 MCG (2000 UT) Tabs Take 2,000 Units by mouth in the morning.         Objective:   BP 117/76   Pulse 60   Ht 5\' 4"  (1.626 m)   Wt 185 lb (83.9 kg)   LMP 11/26/2010   SpO2 97%   BMI 31.76 kg/m   Wt Readings from Last 3 Encounters:  02/18/23 185 lb (83.9 kg)  10/15/22  187 lb (84.8 kg)  08/15/22 190 lb (86.2 kg)    Physical Exam Vitals and nursing note reviewed.  Constitutional:      General: She is not in acute distress.    Appearance: She is well-developed. She is not diaphoretic.  Eyes:     Conjunctiva/sclera: Conjunctivae normal.  Cardiovascular:     Rate and Rhythm: Normal rate and regular rhythm.     Heart sounds: Normal heart sounds. No murmur heard. Pulmonary:     Effort: Pulmonary effort is normal. No respiratory distress.     Breath sounds: Normal breath sounds. No wheezing.  Musculoskeletal:        General: No swelling or tenderness. Normal range of motion.  Skin:    General:  Skin is warm and dry.     Findings: No rash.  Neurological:     Mental Status: She is alert and oriented to person, place, and time.     Coordination: Coordination normal.  Psychiatric:        Behavior: Behavior normal.       Assessment & Plan:   Problem List Items Addressed This Visit       Digestive   Fatty liver     Endocrine   Acquired hypothyroidism   Relevant Medications   levothyroxine (SYNTHROID) 50 MCG tablet   Other Relevant Orders   TSH     Other   Anxiety   Relevant Medications   ALPRAZolam (XANAX) 0.25 MG tablet   Other Relevant Orders   CBC with Differential/Platelet   Depression - Primary   Relevant Medications   ALPRAZolam (XANAX) 0.25 MG tablet   Other Relevant Orders   CBC with Differential/Platelet   Other Visit Diagnoses     Controlled substance agreement signed       Relevant Medications   ALPRAZolam (XANAX) 0.25 MG tablet   Other Relevant Orders   CMP14+EGFR   Lipid panel   Need for Streptococcus pneumoniae vaccination       Relevant Orders   Pneumococcal conjugate vaccine 20-valent (Prevnar 20) (Completed)   Hypomagnesemia       Relevant Orders   Magnesium   Smoking greater than 30 pack years       Relevant Orders   CT CHEST LUNG CA SCREEN LOW DOSE W/O CM     Continue current medicine, seems to be doing well, was low on magnesium before but never took it we will recheck the levels today.  Repeat CT lung cancer screening from last year.  Will check thyroid levels as well.  Follow up plan: Return in about 6 months (around 08/21/2023), or if symptoms worsen or fail to improve, for Physical exam and anxiety and thyroid recheck.  Counseling provided for all of the vaccine components Orders Placed This Encounter  Procedures   CT CHEST LUNG CA SCREEN LOW DOSE W/O CM   Pneumococcal conjugate vaccine 20-valent (Prevnar 20)   CBC with Differential/Platelet   CMP14+EGFR   Lipid panel   TSH   Magnesium    Arville Care,  MD [place] [address] [facility] 02/18/2023, 10:41 AM

## 2023-02-19 ENCOUNTER — Other Ambulatory Visit: Payer: Self-pay | Admitting: Family Medicine

## 2023-02-19 DIAGNOSIS — K080 Exfoliation of teeth due to systemic causes: Secondary | ICD-10-CM | POA: Diagnosis not present

## 2023-02-19 DIAGNOSIS — F17210 Nicotine dependence, cigarettes, uncomplicated: Secondary | ICD-10-CM

## 2023-02-19 LAB — CBC WITH DIFFERENTIAL/PLATELET
Basophils Absolute: 0 10*3/uL (ref 0.0–0.2)
Basos: 1 %
EOS (ABSOLUTE): 0.1 10*3/uL (ref 0.0–0.4)
Eos: 2 %
Hematocrit: 39.4 % (ref 34.0–46.6)
Hemoglobin: 13.2 g/dL (ref 11.1–15.9)
Immature Grans (Abs): 0 10*3/uL (ref 0.0–0.1)
Immature Granulocytes: 0 %
Lymphocytes Absolute: 2.2 10*3/uL (ref 0.7–3.1)
Lymphs: 34 %
MCH: 30.6 pg (ref 26.6–33.0)
MCHC: 33.5 g/dL (ref 31.5–35.7)
MCV: 91 fL (ref 79–97)
Monocytes Absolute: 0.5 10*3/uL (ref 0.1–0.9)
Monocytes: 8 %
Neutrophils Absolute: 3.6 10*3/uL (ref 1.4–7.0)
Neutrophils: 55 %
Platelets: 329 10*3/uL (ref 150–450)
RBC: 4.31 x10E6/uL (ref 3.77–5.28)
RDW: 12.5 % (ref 11.7–15.4)
WBC: 6.4 10*3/uL (ref 3.4–10.8)

## 2023-02-19 LAB — CMP14+EGFR
ALT: 19 IU/L (ref 0–32)
AST: 18 IU/L (ref 0–40)
Albumin: 4.3 g/dL (ref 3.9–4.9)
Alkaline Phosphatase: 122 IU/L — ABNORMAL HIGH (ref 44–121)
BUN/Creatinine Ratio: 18 (ref 12–28)
BUN: 14 mg/dL (ref 8–27)
Bilirubin Total: 0.6 mg/dL (ref 0.0–1.2)
CO2: 24 mmol/L (ref 20–29)
Calcium: 9.4 mg/dL (ref 8.7–10.3)
Chloride: 102 mmol/L (ref 96–106)
Creatinine, Ser: 0.79 mg/dL (ref 0.57–1.00)
Globulin, Total: 2.6 g/dL (ref 1.5–4.5)
Glucose: 97 mg/dL (ref 70–99)
Potassium: 4.6 mmol/L (ref 3.5–5.2)
Sodium: 139 mmol/L (ref 134–144)
Total Protein: 6.9 g/dL (ref 6.0–8.5)
eGFR: 82 mL/min/{1.73_m2} (ref >59)

## 2023-02-19 LAB — MAGNESIUM: Magnesium: 2.2 mg/dL (ref 1.6–2.3)

## 2023-02-19 LAB — LIPID PANEL
Chol/HDL Ratio: 3.3 ratio (ref 0.0–4.4)
Cholesterol, Total: 195 mg/dL (ref 100–199)
HDL: 60 mg/dL (ref >39)
LDL Chol Calc (NIH): 120 mg/dL — ABNORMAL HIGH (ref 0–99)
Triglycerides: 81 mg/dL (ref 0–149)
VLDL Cholesterol Cal: 15 mg/dL (ref 5–40)

## 2023-02-19 LAB — TSH: TSH: 2.62 u[IU]/mL (ref 0.450–4.500)

## 2023-02-19 NOTE — Progress Notes (Signed)
Placed new referral for lung cancer screening

## 2023-02-25 DIAGNOSIS — H353132 Nonexudative age-related macular degeneration, bilateral, intermediate dry stage: Secondary | ICD-10-CM | POA: Diagnosis not present

## 2023-03-13 DIAGNOSIS — S134XXA Sprain of ligaments of cervical spine, initial encounter: Secondary | ICD-10-CM | POA: Diagnosis not present

## 2023-03-13 DIAGNOSIS — M9902 Segmental and somatic dysfunction of thoracic region: Secondary | ICD-10-CM | POA: Diagnosis not present

## 2023-03-13 DIAGNOSIS — M9901 Segmental and somatic dysfunction of cervical region: Secondary | ICD-10-CM | POA: Diagnosis not present

## 2023-03-13 DIAGNOSIS — S233XXA Sprain of ligaments of thoracic spine, initial encounter: Secondary | ICD-10-CM | POA: Diagnosis not present

## 2023-03-27 ENCOUNTER — Ambulatory Visit (HOSPITAL_BASED_OUTPATIENT_CLINIC_OR_DEPARTMENT_OTHER)
Admission: RE | Admit: 2023-03-27 | Discharge: 2023-03-27 | Disposition: A | Discharge status: Home / Self Care | Payer: Medicare Other | Source: Ambulatory Visit | Attending: Family Medicine

## 2023-03-27 DIAGNOSIS — Z87891 Personal history of nicotine dependence: Secondary | ICD-10-CM | POA: Diagnosis not present

## 2023-03-27 DIAGNOSIS — F17210 Nicotine dependence, cigarettes, uncomplicated: Secondary | ICD-10-CM | POA: Diagnosis not present

## 2023-03-27 DIAGNOSIS — H353132 Nonexudative age-related macular degeneration, bilateral, intermediate dry stage: Secondary | ICD-10-CM | POA: Diagnosis not present

## 2023-04-10 DIAGNOSIS — S233XXA Sprain of ligaments of thoracic spine, initial encounter: Secondary | ICD-10-CM | POA: Diagnosis not present

## 2023-04-10 DIAGNOSIS — M9902 Segmental and somatic dysfunction of thoracic region: Secondary | ICD-10-CM | POA: Diagnosis not present

## 2023-04-10 DIAGNOSIS — M9901 Segmental and somatic dysfunction of cervical region: Secondary | ICD-10-CM | POA: Diagnosis not present

## 2023-04-10 DIAGNOSIS — S134XXA Sprain of ligaments of cervical spine, initial encounter: Secondary | ICD-10-CM | POA: Diagnosis not present

## 2023-04-26 DIAGNOSIS — H353132 Nonexudative age-related macular degeneration, bilateral, intermediate dry stage: Secondary | ICD-10-CM | POA: Diagnosis not present

## 2023-04-27 DIAGNOSIS — G4733 Obstructive sleep apnea (adult) (pediatric): Secondary | ICD-10-CM | POA: Diagnosis not present

## 2023-05-26 DIAGNOSIS — H353132 Nonexudative age-related macular degeneration, bilateral, intermediate dry stage: Secondary | ICD-10-CM | POA: Diagnosis not present

## 2023-06-09 DIAGNOSIS — Z1151 Encounter for screening for human papillomavirus (HPV): Secondary | ICD-10-CM | POA: Diagnosis not present

## 2023-06-09 DIAGNOSIS — Z6833 Body mass index (BMI) 33.0-33.9, adult: Secondary | ICD-10-CM | POA: Diagnosis not present

## 2023-06-09 DIAGNOSIS — Z1231 Encounter for screening mammogram for malignant neoplasm of breast: Secondary | ICD-10-CM | POA: Diagnosis not present

## 2023-06-09 DIAGNOSIS — Z01419 Encounter for gynecological examination (general) (routine) without abnormal findings: Secondary | ICD-10-CM | POA: Diagnosis not present

## 2023-06-09 DIAGNOSIS — M816 Localized osteoporosis [Lequesne]: Secondary | ICD-10-CM | POA: Diagnosis not present

## 2023-06-16 DIAGNOSIS — M810 Age-related osteoporosis without current pathological fracture: Secondary | ICD-10-CM | POA: Diagnosis not present

## 2023-06-22 DIAGNOSIS — M810 Age-related osteoporosis without current pathological fracture: Secondary | ICD-10-CM | POA: Diagnosis not present

## 2023-06-25 DIAGNOSIS — H353132 Nonexudative age-related macular degeneration, bilateral, intermediate dry stage: Secondary | ICD-10-CM | POA: Diagnosis not present

## 2023-06-27 DIAGNOSIS — R509 Fever, unspecified: Secondary | ICD-10-CM | POA: Diagnosis not present

## 2023-06-27 DIAGNOSIS — J157 Pneumonia due to Mycoplasma pneumoniae: Secondary | ICD-10-CM | POA: Diagnosis not present

## 2023-06-27 DIAGNOSIS — R0981 Nasal congestion: Secondary | ICD-10-CM | POA: Diagnosis not present

## 2023-06-27 DIAGNOSIS — R059 Cough, unspecified: Secondary | ICD-10-CM | POA: Diagnosis not present

## 2023-07-25 DIAGNOSIS — H353132 Nonexudative age-related macular degeneration, bilateral, intermediate dry stage: Secondary | ICD-10-CM | POA: Diagnosis not present

## 2023-07-27 DIAGNOSIS — G4733 Obstructive sleep apnea (adult) (pediatric): Secondary | ICD-10-CM | POA: Diagnosis not present

## 2023-07-31 ENCOUNTER — Other Ambulatory Visit: Payer: Self-pay | Admitting: Family Medicine

## 2023-07-31 DIAGNOSIS — F419 Anxiety disorder, unspecified: Secondary | ICD-10-CM

## 2023-07-31 DIAGNOSIS — F3341 Major depressive disorder, recurrent, in partial remission: Secondary | ICD-10-CM

## 2023-08-19 ENCOUNTER — Ambulatory Visit: Payer: Medicare Other | Admitting: Cardiovascular Disease

## 2023-08-21 ENCOUNTER — Encounter: Payer: Self-pay | Admitting: Family Medicine

## 2023-08-21 ENCOUNTER — Ambulatory Visit: Payer: Medicare Other | Admitting: Family Medicine

## 2023-08-21 VITALS — BP 125/69 | HR 57 | Ht 64.0 in | Wt 186.0 lb

## 2023-08-21 DIAGNOSIS — Z13220 Encounter for screening for lipoid disorders: Secondary | ICD-10-CM | POA: Diagnosis not present

## 2023-08-21 DIAGNOSIS — Z0000 Encounter for general adult medical examination without abnormal findings: Secondary | ICD-10-CM | POA: Diagnosis not present

## 2023-08-21 DIAGNOSIS — F419 Anxiety disorder, unspecified: Secondary | ICD-10-CM

## 2023-08-21 DIAGNOSIS — M810 Age-related osteoporosis without current pathological fracture: Secondary | ICD-10-CM | POA: Insufficient documentation

## 2023-08-21 DIAGNOSIS — Z0001 Encounter for general adult medical examination with abnormal findings: Secondary | ICD-10-CM | POA: Diagnosis not present

## 2023-08-21 DIAGNOSIS — Z79899 Other long term (current) drug therapy: Secondary | ICD-10-CM

## 2023-08-21 DIAGNOSIS — E039 Hypothyroidism, unspecified: Secondary | ICD-10-CM

## 2023-08-21 DIAGNOSIS — F3341 Major depressive disorder, recurrent, in partial remission: Secondary | ICD-10-CM

## 2023-08-21 MED ORDER — ALPRAZOLAM 0.25 MG PO TABS: 0.5000 mg | ORAL_TABLET | Freq: Every evening | ORAL | 1 refills | Status: AC | PRN

## 2023-08-21 MED ORDER — FLUOXETINE HCL 20 MG PO CAPS: 20.0000 mg | ORAL_CAPSULE | Freq: Every day | ORAL | 3 refills | Status: AC

## 2023-08-21 NOTE — Progress Notes (Signed)
BP 125/69   Pulse (!) 57   Ht 5\' 4"  (1.626 m)   Wt 186 lb (84.4 kg)   LMP 11/26/2010   SpO2 98%   BMI 31.93 kg/m    Subjective:   Patient ID: Kayla Romero, female    DOB: 05/21/1956, 68 y.o.   MRN: [id number]  HPI: Kayla Romero is a 68 y.o. female presenting on 08/21/2023 for Medical Management of Chronic Issues, Hypothyroidism, and Anxiety   HPI Physical exam Patient denies any chest pain, shortness of breath, headaches or vision issues, abdominal complaints, diarrhea, nausea, vomiting, or joint issues.   Anxiety recheck. Current rx-fluoxetine 20 mg daily and alprazolam 0.25, 2 tablets daily as needed # meds rx-60/month Effectiveness of current meds-works well, she does not use it all the time Adverse reactions form meds-none Pill count performed-No Last drug screen -08/29/2022 ( high risk q71m, moderate risk q29m, low risk yearly ) Urine drug screen today- Yes Was the [facility] reviewed-yes  If yes were their any concerning findings? -None Controlled substance contract signed on: Today  Hypothyroidism recheck Patient is coming in for thyroid recheck today as well. They deny any issues with hair changes or heat or cold problems or diarrhea or constipation. They deny any chest pain or palpitations. They are currently on levothyroxine 50 micrograms   Relevant past medical, surgical, family and social history reviewed and updated as indicated. Interim medical history since our last visit reviewed. Allergies and medications reviewed and updated.  Review of Systems  Constitutional:  Negative for chills and fever.  HENT:  Negative for congestion, ear discharge and ear pain.   Eyes:  Negative for redness and visual disturbance.  Respiratory:  Negative for chest tightness and shortness of breath.   Cardiovascular:  Negative for chest pain and leg swelling.  Genitourinary:  Negative for difficulty urinating and dysuria.  Musculoskeletal:  Negative for back pain and gait problem.   Skin:  Negative for rash.  Neurological:  Negative for dizziness, light-headedness and headaches.  Psychiatric/Behavioral:  Negative for agitation and behavioral problems.   All other systems reviewed and are negative.   Per HPI unless specifically indicated above   Allergies as of 08/21/2023   No Known Allergies      Medication List        Accurate as of August 21, 2023 10:15 AM. If you have any questions, ask your nurse or doctor.          ALPRAZolam 0.25 MG tablet Commonly known as: XANAX Take 2 tablets (0.5 mg total) by mouth at bedtime as needed (panic attack).   FLUoxetine 20 MG capsule Commonly known as: PROZAC Take 1 capsule (20 mg total) by mouth daily.   fluticasone 50 MCG/ACT nasal spray Commonly known as: FLONASE SPRAY 2 SPRAYS INTO EACH NOSTRIL EVERY DAY   levothyroxine 50 MCG tablet Commonly known as: SYNTHROID Take 1 tablet (50 mcg total) by mouth daily.   SYSTANE ULTRA OP Place 1 drop into both eyes every evening.   Vitamin D3 50 MCG (2000 UT) Tabs Take 2,000 Units by mouth in the morning.         Objective:   BP 125/69   Pulse (!) 57   Ht 5\' 4"  (1.626 m)   Wt 186 lb (84.4 kg)   LMP 11/26/2010   SpO2 98%   BMI 31.93 kg/m   Wt Readings from Last 3 Encounters:  08/21/23 186 lb (84.4 kg)  02/18/23 185 lb (83.9 kg)  10/15/22  187 lb (84.8 kg)    Physical Exam Vitals and nursing note reviewed.  Constitutional:      General: She is not in acute distress.    Appearance: She is well-developed. She is not diaphoretic.  Eyes:     Conjunctiva/sclera: Conjunctivae normal.  Cardiovascular:     Rate and Rhythm: Normal rate and regular rhythm.     Heart sounds: Normal heart sounds. No murmur heard. Pulmonary:     Effort: Pulmonary effort is normal. No respiratory distress.     Breath sounds: Normal breath sounds. No wheezing.  Musculoskeletal:        General: No tenderness. Normal range of motion.  Skin:    General: Skin is warm and  dry.     Findings: No rash.  Neurological:     Mental Status: She is alert and oriented to person, place, and time.     Coordination: Coordination normal.  Psychiatric:        Behavior: Behavior normal.       Assessment & Plan:   Problem List Items Addressed This Visit       Endocrine   Acquired hypothyroidism   Relevant Orders   CBC with Differential/Platelet   CMP14+EGFR   TSH     Musculoskeletal and Integument   Osteoporosis     Other   Anxiety   Relevant Medications   ALPRAZolam (XANAX) 0.25 MG tablet   FLUoxetine (PROZAC) 20 MG capsule   Depression   Relevant Medications   ALPRAZolam (XANAX) 0.25 MG tablet   FLUoxetine (PROZAC) 20 MG capsule   Other Visit Diagnoses       Physical exam    -  Primary   Relevant Orders   CBC with Differential/Platelet   CMP14+EGFR   Lipid panel   TSH     Lipid screening       Relevant Orders   CBC with Differential/Platelet   CMP14+EGFR   Lipid panel     Controlled substance agreement signed       Relevant Medications   ALPRAZolam (XANAX) 0.25 MG tablet   Other Relevant Orders   ToxASSURE Select 13 (MW), Urine       Patient was recently diagnosed with osteoporosis and her gynecologist and gave her alendronate which she has not started yet she is getting go see her dentist first and then discuss it with them.  She just had mammogram and Pap smear and pelvic exam with gynecology couple weeks ago.  Will request records Follow up plan: Return in about 6 months (around 02/18/2024), or if symptoms worsen or fail to improve, for Thyroid and anxiety recheck.  Counseling provided for all of the vaccine components Orders Placed This Encounter  Procedures   CBC with Differential/Platelet   CMP14+EGFR   Lipid panel   TSH   ToxASSURE Select 13 (MW), Urine    Arville Care, MD [facility] 08/21/2023, 10:15 AM

## 2023-08-22 LAB — CBC WITH DIFFERENTIAL/PLATELET
Basophils Absolute: 0 10*3/uL (ref 0.0–0.2)
Basos: 1 %
EOS (ABSOLUTE): 0.1 10*3/uL (ref 0.0–0.4)
Eos: 2 %
Hematocrit: 39.9 % (ref 34.0–46.6)
Hemoglobin: 13.2 g/dL (ref 11.1–15.9)
Immature Grans (Abs): 0 10*3/uL (ref 0.0–0.1)
Immature Granulocytes: 0 %
Lymphocytes Absolute: 1.9 10*3/uL (ref 0.7–3.1)
Lymphs: 34 %
MCH: 30.1 pg (ref 26.6–33.0)
MCHC: 33.1 g/dL (ref 31.5–35.7)
MCV: 91 fL (ref 79–97)
Monocytes Absolute: 0.4 10*3/uL (ref 0.1–0.9)
Monocytes: 7 %
Neutrophils Absolute: 3.1 10*3/uL (ref 1.4–7.0)
Neutrophils: 56 %
Platelets: 271 10*3/uL (ref 150–450)
RBC: 4.38 x10E6/uL (ref 3.77–5.28)
RDW: 12.4 % (ref 11.7–15.4)
WBC: 5.5 10*3/uL (ref 3.4–10.8)

## 2023-08-22 LAB — CMP14+EGFR
ALT: 21 [IU]/L (ref 0–32)
AST: 23 [IU]/L (ref 0–40)
Albumin: 4.2 g/dL (ref 3.9–4.9)
Alkaline Phosphatase: 124 [IU]/L — ABNORMAL HIGH (ref 44–121)
BUN/Creatinine Ratio: 19 (ref 12–28)
BUN: 15 mg/dL (ref 8–27)
Bilirubin Total: 0.3 mg/dL (ref 0.0–1.2)
CO2: 22 mmol/L (ref 20–29)
Calcium: 8.9 mg/dL (ref 8.7–10.3)
Chloride: 102 mmol/L (ref 96–106)
Creatinine, Ser: 0.8 mg/dL (ref 0.57–1.00)
Globulin, Total: 2.7 g/dL (ref 1.5–4.5)
Glucose: 98 mg/dL (ref 70–99)
Potassium: 4.2 mmol/L (ref 3.5–5.2)
Sodium: 141 mmol/L (ref 134–144)
Total Protein: 6.9 g/dL (ref 6.0–8.5)
eGFR: 81 mL/min/{1.73_m2} (ref >59)

## 2023-08-22 LAB — TSH: TSH: 3.06 u[IU]/mL (ref 0.450–4.500)

## 2023-08-22 LAB — LIPID PANEL
Chol/HDL Ratio: 3.6 {ratio} (ref 0.0–4.4)
Cholesterol, Total: 172 mg/dL (ref 100–199)
HDL: 48 mg/dL (ref >39)
LDL Chol Calc (NIH): 95 mg/dL (ref 0–99)
Triglycerides: 167 mg/dL — ABNORMAL HIGH (ref 0–149)
VLDL Cholesterol Cal: 29 mg/dL (ref 5–40)

## 2023-08-24 DIAGNOSIS — H353132 Nonexudative age-related macular degeneration, bilateral, intermediate dry stage: Secondary | ICD-10-CM | POA: Diagnosis not present

## 2023-08-25 LAB — TOXASSURE SELECT 13 (MW), URINE

## 2023-08-27 DIAGNOSIS — K080 Exfoliation of teeth due to systemic causes: Secondary | ICD-10-CM | POA: Diagnosis not present

## 2023-08-28 ENCOUNTER — Encounter: Payer: Self-pay | Admitting: Family Medicine

## 2023-09-22 ENCOUNTER — Encounter: Payer: Self-pay | Admitting: Family Medicine

## 2023-09-22 DIAGNOSIS — H3509 Other intraretinal microvascular abnormalities: Secondary | ICD-10-CM | POA: Diagnosis not present

## 2023-09-22 DIAGNOSIS — H35372 Puckering of macula, left eye: Secondary | ICD-10-CM | POA: Diagnosis not present

## 2023-09-22 DIAGNOSIS — H353132 Nonexudative age-related macular degeneration, bilateral, intermediate dry stage: Secondary | ICD-10-CM | POA: Diagnosis not present

## 2023-09-22 DIAGNOSIS — D3132 Benign neoplasm of left choroid: Secondary | ICD-10-CM | POA: Diagnosis not present

## 2023-09-23 DIAGNOSIS — H353132 Nonexudative age-related macular degeneration, bilateral, intermediate dry stage: Secondary | ICD-10-CM | POA: Diagnosis not present

## 2023-10-15 DIAGNOSIS — K080 Exfoliation of teeth due to systemic causes: Secondary | ICD-10-CM | POA: Diagnosis not present

## 2023-10-20 ENCOUNTER — Encounter: Payer: Self-pay | Admitting: Nurse Practitioner

## 2023-10-20 ENCOUNTER — Ambulatory Visit (INDEPENDENT_AMBULATORY_CARE_PROVIDER_SITE_OTHER): Admitting: Nurse Practitioner

## 2023-10-20 ENCOUNTER — Encounter: Payer: Self-pay | Admitting: Cardiovascular Disease

## 2023-10-20 ENCOUNTER — Ambulatory Visit: Payer: Medicare Other | Attending: Cardiovascular Disease | Admitting: Cardiovascular Disease

## 2023-10-20 VITALS — BP 120/73 | HR 77 | Temp 97.8°F | Ht 63.0 in | Wt 189.0 lb

## 2023-10-20 DIAGNOSIS — I493 Ventricular premature depolarization: Secondary | ICD-10-CM

## 2023-10-20 DIAGNOSIS — R051 Acute cough: Secondary | ICD-10-CM

## 2023-10-20 DIAGNOSIS — E039 Hypothyroidism, unspecified: Secondary | ICD-10-CM | POA: Diagnosis not present

## 2023-10-20 DIAGNOSIS — R0683 Snoring: Secondary | ICD-10-CM | POA: Diagnosis not present

## 2023-10-20 DIAGNOSIS — G4733 Obstructive sleep apnea (adult) (pediatric): Secondary | ICD-10-CM

## 2023-10-20 DIAGNOSIS — F419 Anxiety disorder, unspecified: Secondary | ICD-10-CM

## 2023-10-20 MED ORDER — HYDROCODONE BIT-HOMATROP MBR 5-1.5 MG/5ML PO SOLN
5.0000 mL | Freq: Four times a day (QID) | ORAL | 0 refills | Status: DC | PRN
Start: 2023-10-20 — End: 2023-11-20

## 2023-10-20 MED ORDER — PREDNISONE 20 MG PO TABS
40.0000 mg | ORAL_TABLET | Freq: Every day | ORAL | 0 refills | Status: AC
Start: 2023-10-20 — End: 2023-10-25

## 2023-10-20 NOTE — Progress Notes (Signed)
 Subjective:    Patient ID: Kayla Romero, female    DOB: June 29, 1956, 68 y.o.   MRN: [id number]   Chief Complaint: Cough (Dry Cough/)   Cough This is a new problem. The current episode started in the past 7 days. The problem has been waxing and waning. The problem occurs every few minutes. The cough is Non-productive. Associated symptoms include rhinorrhea. Pertinent negatives include no chills, ear congestion, ear pain, fever, sore throat, shortness of breath or wheezing. Nothing aggravates the symptoms. She has tried OTC cough suppressant for the symptoms.    Patient Active Problem List   Diagnosis Date Noted   Osteoporosis 08/21/2023   Acquired hypothyroidism 02/12/2022   Obstructive sleep apnea 09/03/2021   Palpitations 03/25/2019   Obesity (BMI 30.0-34.9) 02/04/2018   Knee pain, chronic 08/08/2014   Anxiety    Depression    Fatty liver    Vitamin D  deficiency        Review of Systems  Constitutional:  Negative for chills and fever.  HENT:  Positive for rhinorrhea. Negative for ear pain and sore throat.   Respiratory:  Positive for cough. Negative for shortness of breath and wheezing.        Objective:   Physical Exam Constitutional:      Appearance: Normal appearance. She is obese.  HENT:     Right Ear: Tympanic membrane normal.     Left Ear: Tympanic membrane normal.     Nose: No congestion or rhinorrhea.     Mouth/Throat:     Pharynx: No oropharyngeal exudate or posterior oropharyngeal erythema.  Cardiovascular:     Rate and Rhythm: Normal rate and regular rhythm.     Heart sounds: Normal heart sounds.  Pulmonary:     Effort: Pulmonary effort is normal.     Breath sounds: Normal breath sounds.     Comments: Deep dry cough Skin:    General: Skin is warm.  Neurological:     General: No focal deficit present.     Mental Status: She is alert and oriented to person, place, and time.  Psychiatric:        Mood and Affect: Mood normal.        Behavior:  Behavior normal.    BP 120/73   Pulse 77   Temp 97.8 F (36.6 C) (Temporal)   Ht 5\' 3"  (1.6 m)   Wt 189 lb (85.7 kg)   LMP 11/26/2010   SpO2 98%   BMI 33.48 kg/m         Assessment & Plan:   Eleanora Grew in today with chief complaint of Cough (Dry Cough/)   1. Acute cough (Primary) 1. Take meds as prescribed 2. Use a cool mist humidifier especially during the winter months and when heat has been humid. 3. Use saline nose sprays frequently 4. Saline irrigations of the nose can be very helpful if done frequently.  * 4X daily for 1 week*  * Use of a nettie pot can be helpful with this. Follow directions with this* 5. Drink plenty of fluids 6. Keep thermostat turn down low 7.For any cough or congestion- hycodan as prescribed- sedation precautions 8. For fever or aces or pains- take tylenol  or ibuprofen appropriate for age and weight.  * for fevers greater than 101 orally you may alternate ibuprofen and tylenol  every  3 hours.    - HYDROcodone  bit-homatropine (HYCODAN) 5-1.5 MG/5ML syrup; Take 5 mLs by mouth every 6 (six) hours as needed for  cough.  Dispense: 120 mL; Refill: 0 - predniSONE  (DELTASONE ) 20 MG tablet; Take 2 tablets (40 mg total) by mouth daily with breakfast for 5 days. 2 po daily for 5 days  Dispense: 10 tablet; Refill: 0    The above assessment and management plan was discussed with the patient. The patient verbalized understanding of and has agreed to the management plan. Patient is aware to call the clinic if symptoms persist or worsen. Patient is aware when to return to the clinic for a follow-up visit. Patient educated on when it is appropriate to go to the emergency department.   Mary-Margaret Gaylyn Keas, FNP

## 2023-10-20 NOTE — Progress Notes (Signed)
 Cardiology Office Note    Date:  10/20/2023   ID:  Kayla Romero, Kayla Romero [birth year], MRN [id number]  PCP:  Dettinger, Lucio Sabin, MD  Cardiologist:  Magnus Schuller, MD (sleep); Dr. Katheryne Pane  18 month F/U sleep evaluation referred by Dr. Katheryne Pane  History of Present Illness:  Kayla Romero is a 68 y.o. female was followed by Dr. Lauro Portal for cardiology care.  She has a history of palpitations, prior longstanding tobacco history.  An echo Doppler study in September 2020 was essentially normal.  She has been documented to have frequent PVCs with occasional trigeminal pattern on Holter monitoring.  When last evaluated by him in March 2023 to her body habitus consistent with obstructive sleep apnea along with nocturnal snoring and daytime somnolence she was referred for sleep study to assess for possible obstructive sleep apnea.  I saw her for my initial sleep evaluation on April 07, 2022.  She presents for an 16-month follow-up evaluation.  On October 07, 2021 [name] underwent a home sleep study was found to have moderately severe obstructive sleep apnea with AHI of 29.8/h and had significant oxygen desaturation to a nadir of 74%.  On Nov 21, 2021 she underwent an in lab CPAP titration.  However, the study was limited and her CPAP was never titrated from 5 cm and she only had 13 minutes and 30 seconds of REM sleep.  As result, I recommended she initiate a trial of CPAP auto therapy with EPR of 3 at a pressure range of 5 to 11 cm of water.  [facility] is her DME company and she received a new ResMed AirSense 11 CPAP auto machine on January 21, 2022.  Prior to CPAP initiation, she admitted to snoring, had decreased energy, and had daytime sleepiness.  Now that she has been on CPAP therapy since her set up date she has much more energy.  She feels better when she wakes up.  Typically she goes to bed at 10 PM and wakes up between 5 and 5:30 AM.  She is unaware of any breakthrough snoring.  She has  nocturia 0-1 time per night.  I obtained a download from July 25 through February 19, 2022.  Compliance is excellent with 100% use and average use at 7 hours and 21 minutes.  At her 5 to 11 cm pressure range AHI is excellent at 2.9.  Her 95th percentile pressure is 9.2 with maximum average pressure 10.2.  I obtained a new download in the office today from September 7 through April 04, 2022.  Compliance continues to be excellent with 100% use.  At her 5-11 pressure range, AHI is 2.3/h with maximal average pressure at 9.8.  An Epworth Sleepiness Scale score was calculated in the office today and this endorsed that 1 argue against any residual daytime sleepiness.  She believes she is sleeping well.  She denies any bruxism, restless legs, hypnopompic or hypnagogic hallucinations or cataplectic events.  During her initial evaluation, I had an extensive discussion with her regarding potential adverse cardiovascular consequences of untreated sleep apnea regarding its effects on blood pressure control, potential for nocturnal arrhythmias, palpitations, increased risk for atrial fibrillation.  In addition I discussed negative implications regarding insulin resistance, increase in inflammatory markers as well as potential for nocturnal GERD.  I had also discussed potential nocturnal hypoxemia contributing to nocturnal ischemia cardiac as well as cerebrovascular if atherosclerosis is present.  Since I last saw her, she has continued  to do well.  Did have an episode of bronchitis and pneumonia recently.  She continues to use CPAP with excellent compliance.  A download from March 22 through October 18, 2023 shows 100% use with average use at 8 hours and 6 minutes.  CPAP is set at a pressure change of 7 to 13 cm.  AHI is 2.2.  For percentile pressure is 10.4 with maximum pressure 11.3.  She presents for follow-up evaluation.   Past Medical History:  Diagnosis Date   Anxiety    h/o, recently has not had to use xanax  much.    Cyst of thymus gland (HCC)    being monitored   Depression    Dysrhythmia 2021   PVC   Fatty liver    History of EKG    first presented /w L shoulder pain, 2007, seen by [facility]. , had EKG then & it was wnl.     Macular degeneration    Mediastinal mass    Obesity (BMI 30-39.9)    PVC's (premature ventricular contractions)    RUQ pain    Thymus, cyst (HCC)    first seen on 2007, had some care with Dr. Percy Bracken & then a Conway Dennis MD fr. [facility]     Thymus, cyst (HCC)    diagnosed approx. 2007, seen by Dr. Percy Bracken, but now followed by [facility] in [place]   Vitamin D  deficiency     Past Surgical History:  Procedure Laterality Date   CHOLECYSTECTOMY N/A 08/31/2012   Procedure: LAPAROSCOPIC CHOLECYSTECTOMY possible IOC ;  Surgeon: Enid Harry, MD;  Location: [facility] OR;  Service: General;  Laterality: N/A;   EYE SURGERY  2008   bilateral removal of cataracts, /w IOL   KNEE ARTHROSCOPY WITH MEDIAL MENISECTOMY Left 09/09/2021   Procedure: KNEE ARTHROSCOPY WITH PARTIAL MEDIAL MENISECTOMY;  Surgeon: Janeth Medicus, MD;  Location: [facility];  Service: Orthopedics;  Laterality: Left;   TUBAL LIGATION  1988    Current Medications: Outpatient Medications Prior to Visit  Medication Sig Dispense Refill   ALPRAZolam  (XANAX ) 0.25 MG tablet Take 2 tablets (0.5 mg total) by mouth at bedtime as needed (panic attack). 60 tablet 1   Cholecalciferol (VITAMIN D3) 50 MCG (2000 UT) TABS Take 2,000 Units by mouth in the morning.     FLUoxetine  (PROZAC ) 20 MG capsule Take 1 capsule (20 mg total) by mouth daily. 90 capsule 3   fluticasone  (FLONASE ) 50 MCG/ACT nasal spray SPRAY 2 SPRAYS INTO EACH NOSTRIL EVERY DAY 48 mL 1   levothyroxine  (SYNTHROID ) 50 MCG tablet Take 1 tablet (50 mcg total) by mouth daily. 90 tablet 3   Polyethyl Glycol-Propyl Glycol (SYSTANE ULTRA OP) Place 1 drop into both eyes every evening.     No facility-administered medications prior to  visit.     Allergies:   Patient has no known allergies.   Social History   Socioeconomic History   Marital status: Married    Spouse name: Not on file   Number of children: Not on file   Years of education: Not on file   Highest education level: GED or equivalent  Occupational History   Not on file  Tobacco Use   Smoking status: Former    Current packs/day: 0.00    Average packs/day: 1 pack/day for 20.0 years (20.0 ttl pk-yrs)    Types: Cigarettes    Start date: 05/31/1995    Quit date: 05/31/2015    Years since quitting: 8.3  Smokeless tobacco: Never   Tobacco comments:    quit 2 weeks ago completely. had quit in 2012 but restarted  Vaping Use   Vaping status: Never Used  Substance and Sexual Activity   Alcohol use: No   Drug use: No   Sexual activity: Not on file  Other Topics Concern   Not on file  Social History Narrative   Not on file   Social Drivers of Health   Financial Resource Strain: Low Risk  (08/20/2023)   Overall Financial Resource Strain (CARDIA)    Difficulty of Paying Living Expenses: Not very hard  Food Insecurity: No Food Insecurity (08/20/2023)   Hunger Vital Sign    Worried About Running Out of Food in the Last Year: Never true    Ran Out of Food in the Last Year: Never true  Transportation Needs: No Transportation Needs (08/20/2023)   PRAPARE - Administrator, Civil Service (Medical): No    Lack of Transportation (Non-Medical): No  Physical Activity: Sufficiently Active (08/20/2023)   Exercise Vital Sign    Days of Exercise per Week: 3 days    Minutes of Exercise per Session: 150+ min  Stress: No Stress Concern Present (08/20/2023)   [employer] of Occupational Health - Occupational Stress Questionnaire    Feeling of Stress : Only a little  Social Connections: Moderately Isolated (08/20/2023)   Social Connection and Isolation Panel [NHANES]    Frequency of Communication with Friends and Family: Once a week    Frequency of  Social Gatherings with Friends and Family: Once a week    Attends Religious Services: 1 to 4 times per year    Active Member of [employer] or Organizations: No    Attends Engineer, structural: Not on file    Marital Status: Married    Socially she is in her second marriage.  Been with her current husband for a total of 16 years of which the last 10 they have been married.  She has 3 children ages 85, 14 and 83.  Her eldest child has sleep apnea.  Family History:  The patient's family history includes Aneurysm in her father; Cancer in her mother; Cirrhosis in her mother; Heart disease in her mother; Kidney disease in her mother.   ROS General: Negative; No fevers, chills, or night sweats;  HEENT: Negative; No changes in vision or hearing, sinus congestion, difficulty swallowing Pulmonary: Negative; No cough, wheezing, shortness of breath, hemoptysis Cardiovascular: Negative; No chest pain, presyncope, syncope, palpitations GI: Negative; No nausea, vomiting, diarrhea, or abdominal pain GU: Negative; No dysuria, hematuria, or difficulty voiding Musculoskeletal: Negative; no myalgias, joint pain, or weakness Hematologic/Oncology: Negative; no easy bruising, bleeding Endocrine: Negative; no heat/cold intolerance; no diabetes Neuro: Negative; no changes in balance, headaches Skin: Negative; No rashes or skin lesions Psychiatric: Negative; No behavioral problems, depression Sleep: Negative; No snoring, daytime sleepiness, hypersomnolence, bruxism, restless legs, hypnogognic hallucinations, no cataplexy Other comprehensive 14 point system review is negative.   PHYSICAL EXAM:   VS:  BP 116/72   Pulse 79   Ht 5\' 3"  (1.6 m)   Wt 189 lb 12.8 oz (86.1 kg)   LMP 11/26/2010   SpO2 96%   BMI 33.62 kg/m     Repeat blood pressure by me was 112/70.  Wt Readings from Last 3 Encounters:  10/20/23 189 lb (85.7 kg)  10/20/23 189 lb 12.8 oz (86.1 kg)  08/21/23 186 lb (84.4 kg)    General:  Alert, oriented, no  distress.  Skin: normal turgor, no rashes, warm and dry HEENT: Normocephalic, atraumatic. Pupils equal round and reactive to light; sclera anicteric; extraocular muscles intact;  Nose without nasal septal hypertrophy Mouth/Parynx benign; Mallinpatti scale 3 Neck: No JVD, no carotid bruits; normal carotid upstroke Lungs: clear to ausculatation and percussion; no wheezing or rales Chest wall: without tenderness to palpitation Heart: PMI not displaced, RRR, s1 s2 normal, 1/6 systolic murmur, no diastolic murmur, no rubs, gallops, thrills, or heaves Abdomen: soft, nontender; no hepatosplenomehaly, BS+; abdominal aorta nontender and not dilated by palpation. Back: no CVA tenderness Pulses 2+ Musculoskeletal: full range of motion, normal strength, no joint deformities Extremities: no clubbing cyanosis or edema, Homan's sign negative  Neurologic: grossly nonfocal; Cranial nerves grossly wnl Psychologic: Normal mood and affect   Studies/Labs Reviewed:   EKG Interpretation Date/Time:  Tuesday October 20 2023 08:52:56 EDT Ventricular Rate:  79 PR Interval:  160 QRS Duration:  82 QT Interval:  362 QTC Calculation: 415 R Axis:   -5  Text Interpretation: Normal sinus rhythm Normal ECG No previous ECGs available Confirmed by Magnus Schuller ([id number]) on 10/20/2023 8:55:54 AM    I personally reviewed the ECG from September 03, 2021: NSR at 73, no ectopy  Recent Labs:    Latest Ref Rng & Units 08/21/2023   10:51 AM 02/18/2023   10:44 AM 08/15/2022   11:24 AM  BMP  Glucose 70 - 99 mg/dL 98  97  85   BUN 8 - 27 mg/dL 15  14  15    Creatinine 0.57 - 1.00 mg/dL 6.04  5.40  9.81   BUN/Creat Ratio 12 - 28 19  18  21    Sodium 134 - 144 mmol/L 141  139  141   Potassium 3.5 - 5.2 mmol/L 4.2  4.6  4.5   Chloride 96 - 106 mmol/L 102  102  102   CO2 20 - 29 mmol/L 22  24  23    Calcium 8.7 - 10.3 mg/dL 8.9  9.4  9.4         Latest Ref Rng & Units 08/21/2023   10:51 AM 02/18/2023   10:44  AM 08/15/2022   11:24 AM  Hepatic Function  Total Protein 6.0 - 8.5 g/dL 6.9  6.9  7.0   Albumin 3.9 - 4.9 g/dL 4.2  4.3  4.6   AST 0 - 40 IU/L 23  18  39   ALT 0 - 32 IU/L 21  19  48   Alk Phosphatase 44 - 121 IU/L 124  122  139   Total Bilirubin 0.0 - 1.2 mg/dL 0.3  0.6  0.5        Latest Ref Rng & Units 08/21/2023   10:51 AM 02/18/2023   10:44 AM 08/15/2022   11:24 AM  CBC  WBC 3.4 - 10.8 x10E3/uL 5.5  6.4  6.1   Hemoglobin 11.1 - 15.9 g/dL 19.1  47.8  29.5   Hematocrit 34.0 - 46.6 % 39.9  39.4  39.4   Platelets 150 - 450 x10E3/uL 271  329  298    Lab Results  Component Value Date   MCV 91 08/21/2023   MCV 91 02/18/2023   MCV 90 08/15/2022   Lab Results  Component Value Date   TSH 3.060 08/21/2023   Lab Results  Component Value Date   HGBA1C 5.2% 10/18/2013     BNP No results found for: "BNP"  ProBNP No results found for: "PROBNP"   Lipid Panel  Component Value Date/Time   CHOL 172 08/21/2023 1051   TRIG 167 (H) 08/21/2023 1051   TRIG 120 07/18/2013 1451   HDL 48 08/21/2023 1051   HDL 41 07/18/2013 1451   CHOLHDL 3.6 08/21/2023 1051   LDLCALC 95 08/21/2023 1051   LDLCALC 76 07/18/2013 1451   LABVLDL 29 08/21/2023 1051     RADIOLOGY: No results found.   Additional studies/ records that were reviewed today include:   Patient Name: Beanca, Kiester Date: 11/21/2021 Gender: Female D.O.B: 07-23-1955 Age (years): 65 Referring Provider: Avanell Leigh Height (inches): 64 Interpreting Physician: Magnus Schuller MD, ABSM Weight (lbs): 180 RPSGT: Odella Bending BMI: 31 MRN: [id number] Neck Size: 15.00   CLINICAL INFORMATION The patient is referred for a CPAP titration to treat sleep apnea.   Date of HST: 10/07/2021:  AHI 29.8/h; O2 nadir 74%.   SLEEP STUDY TECHNIQUE As per the AASM Manual for the Scoring of Sleep and Associated Events v2.3 (April 2016) with a hypopnea requiring 4% desaturations.   The channels recorded and monitored  were frontal, central and occipital EEG, electrooculogram (EOG), submentalis EMG (chin), nasal and oral airflow, thoracic and abdominal wall motion, anterior tibialis EMG, snore microphone, electrocardiogram, and pulse oximetry. Continuous positive airway pressure (CPAP) was initiated at the beginning of the study and titrated to treat sleep-disordered breathing.   MEDICATIONS ALPRAZolam  (XANAX ) 0.25 MG tablet Cholecalciferol (VITAMIN D3) 50 MCG (2000 UT) TABS FLUoxetine  (PROZAC ) 10 MG capsule fluticasone  (FLONASE ) 50 MCG/ACT nasal spray HYDROcodone -acetaminophen  (NORCO/VICODIN) 5-325 MG tablet levothyroxine  (SYNTHROID ) 50 MCG tablet Magnesium 100 MG TABS ondansetron  (ZOFRAN ) 4 MG tablet Polyethyl Glycol-Propyl Glycol (SYSTANE ULTRA OP)  Medications self-administered by patient taken the night of the study : MAGNESIUM GLUCONATE   TECHNICIAN COMMENTS Comments added by technician: Patient had difficulty initiating sleep. Patient was restless all through the night. Comments added by scorer: N/A   RESPIRATORY PARAMETERS Optimal PAP Pressure (cm):   AHI at Optimal Pressure (/hr):            N/A Overall Minimal O2 (%):         89.0     Supine % at Optimal Pressure (%):    N/A Minimal O2 at Optimal Pressure (%): 89.0        SLEEP ARCHITECTURE The study was initiated at 11:04:09 PM and ended at 5:03:16 AM.   Sleep onset time was 210.5 minutes and the sleep efficiency was 39.1%%. The total sleep time was 140.5 minutes.   The patient spent 1.8%% of the night in stage N1 sleep, 88.6%% in stage N2 sleep, 0.0%% in stage N3 and 9.6% in REM.Stage REM latency was 74.0 minutes   Wake after sleep onset was 8.1. Alpha intrusion was absent. Supine sleep was 0.00%.   CARDIAC DATA The 2 lead EKG demonstrated sinus rhythm. The mean heart rate was 54.0 beats per minute. Other EKG findings include: PVCs.   LEG MOVEMENT DATA The total Periodic Limb Movements of Sleep (PLMS) were 0. The PLMS index was 0.0.  A PLMS index of <15 is considered normal in adults.   IMPRESSIONS - CPAP was initiated at 5 cm and maintained throughout the study with only 13 minutes and 30 seconds of REM sleep. (AHI 0.4/h; O2 nadir 89%). - Central sleep apnea was not noted during this titration (CAI  0.4/h). - Mild oxygen desaturations to a nadir of 89.0%. - No snoring was audible during this study. - 2-lead EKG demonstrated: PVCs - Clinically significant periodic limb movements were not  noted during this study. Arousals associated with PLMs were significant.   DIAGNOSIS - Obstructive Sleep Apnea (G47.33)   RECOMMENDATIONS - Recommend a trial of CPAP Auto with EPR of 3 at 5 - 11 cm of water. - Effort should be made to optimize nasal and oropharyngeal patency. - Avoid alcohol, sedatives and other CNS depressants that may worsen sleep apnea and disrupt normal sleep architecture. - Sleep hygiene should be reviewed to assess factors that may improve sleep quality. - Weight management (BMI 31) and regular exercise should be initiated or continued. - Recommend a download and sleep clinic evaluation after 4 weeks of therapy.    ASSESSMENT:    1. OSA (obstructive sleep apnea)   2. Snoring   3. Acquired hypothyroidism   4. Anxiety   5. PVCs (premature ventricular contractions)     PLAN:  This Micaylah Bertucci is a very pleasant 68 year old female who is followed by Dr. Katheryne Pane for cardiology care has a history of palpitations, hypothyroidism mild obesity, was found to have moderately severe obstructive sleep apnea with an AHI of 29.8/h with associated severe oxygen desaturation to a nadir of 74%.  On her initial home study O2 time spent less than 89% was 39.6 minutes.  She underwent CPAP titration and since that time she has been on a CPAP auto mode of 5 to 11 cm of water.  Compliance has been excellent with 100% use with average use at 7-1/2 to 8 hours per night.  AHI is excellent at 2.9 and 2.3 on her prior downloads.  With  her 95th percentile pressure at 9.2 I am changing her pressure range to 7 to 13 cm of water.  On that initial evaluation I had extensively reviewed the potential adverse cardiovascular consequences of untreated sleep apnea which were outlined above.  Fully, her blood pressure is stable and on repeat by me was 112/70.  Her most recent download shows excellent compliance with 100% use and average use at 8 hours and 6 minutes.  AHI is excellent at 2.2 with her pressure range set at 7 to 13 cm.  95th percentile pressure is 10.4 with maximum average pressure 11.3.  She uses a nasal pillow mask which she prefers over the nose mask.  She sees Dr. Melodie Spry Dettinger for primary care and is on levothyroxine  50 mcg for hypothyroidism and takes fluoxetine  for anxiety.  He takes Flonase  to her nostrils.  Her ECG today remains stable.  Epworth scale is excellent with no residual daytime sleepiness and score calculated at 3.  ECG is stable without ectopy at 79 bpm.  I discussed with her my plans for retirement in several months.  She will continue cardiology care with Dr. Katheryne Pane.  If sleep issues arise she will see Dr. Starr Eddy.  She continues to use [facility] as her DME.  Medication Adjustments/Labs and Tests Ordered: Current medicines are reviewed at length with the patient today.  Concerns regarding medicines are outlined above.  Medication changes, Labs and Tests ordered today are listed in the Patient Instructions below. Patient Instructions  Medication Instructions:  NO CHANGES *If you need a refill on your cardiac medications before your next appointment, please call your pharmacy*  Lab Work: NO LABS If you have labs (blood work) drawn today and your tests are completely normal, you will receive your results only by: MyChart Message (if you have MyChart) OR A paper copy in the mail If you have any lab test that is abnormal or we need to  change your treatment, we will call you to review the  results.  Testing/Procedures: NO TESTING  Follow-Up: At [facility], you and your health needs are our priority.  As part of our continuing mission to provide you with exceptional heart care, our providers are all part of one team.  This team includes your primary Cardiologist (physician) and Advanced Practice Providers or APPs (Physician Assistants and Nurse Practitioners) who all work together to provide you with the care you need, when you need it.  Your next appointment:   FOLLOW UP AS NEEDED  Provider:   Gaylyn Keas, MD  Other Instructions   1st Floor: - Lobby - Registration  - Pharmacy  - Lab - Cafe  2nd Floor: - PV Lab - Diagnostic Testing (echo, CT, nuclear med)  3rd Floor: - Vacant  4th Floor: - TCTS (cardiothoracic surgery) - AFib Clinic - Structural Heart Clinic - Vascular Surgery  - Vascular Ultrasound  5th Floor: - HeartCare Cardiology (general and EP) - Clinical Pharmacy for coumadin, hypertension, lipid, weight-loss medications, and med management appointments    Valet parking services will be available as well.       Signed, Magnus Schuller, MD, [facility], ABSM Diplomate, American Board of Sleep Medicine  10/20/2023 5:45 PM    [facility] [address], [place], [place]  [address] Phone: [phone]

## 2023-10-20 NOTE — Patient Instructions (Signed)
 Medication Instructions:  NO CHANGES *If you need a refill on your cardiac medications before your next appointment, please call your pharmacy*  Lab Work: NO LABS If you have labs (blood work) drawn today and your tests are completely normal, you will receive your results only by: MyChart Message (if you have MyChart) OR A paper copy in the mail If you have any lab test that is abnormal or we need to change your treatment, we will call you to review the results.  Testing/Procedures: NO TESTING  Follow-Up: At [facility], you and your health needs are our priority.  As part of our continuing mission to provide you with exceptional heart care, our providers are all part of one team.  This team includes your primary Cardiologist (physician) and Advanced Practice Providers or APPs (Physician Assistants and Nurse Practitioners) who all work together to provide you with the care you need, when you need it.  Your next appointment:   FOLLOW UP AS NEEDED  Provider:   Gaylyn Keas, MD  Other Instructions   1st Floor: - Lobby - Registration  - Pharmacy  - Lab - Cafe  2nd Floor: - PV Lab - Diagnostic Testing (echo, CT, nuclear med)  3rd Floor: - Vacant  4th Floor: - TCTS (cardiothoracic surgery) - AFib Clinic - Structural Heart Clinic - Vascular Surgery  - Vascular Ultrasound  5th Floor: - HeartCare Cardiology (general and EP) - Clinical Pharmacy for coumadin, hypertension, lipid, weight-loss medications, and med management appointments    Valet parking services will be available as well.

## 2023-10-20 NOTE — Patient Instructions (Signed)
 1. Take meds as prescribed 2. Use a cool mist humidifier especially during the winter months and when heat has been humid. 3. Use saline nose sprays frequently 4. Saline irrigations of the nose can be very helpful if done frequently.  * 4X daily for 1 week*  * Use of a nettie pot can be helpful with this. Follow directions with this* 5. Drink plenty of fluids 6. Keep thermostat turn down low 7.For any cough or congestion- hycodan as prescribed with sedation precautions 8. For fever or aces or pains- take tylenol  or ibuprofen appropriate for age and weight.  * for fevers greater than 101 orally you may alternate ibuprofen and tylenol  every  3 hours.

## 2023-10-23 DIAGNOSIS — H353132 Nonexudative age-related macular degeneration, bilateral, intermediate dry stage: Secondary | ICD-10-CM | POA: Diagnosis not present

## 2023-11-02 ENCOUNTER — Ambulatory Visit: Admitting: Family Medicine

## 2023-11-02 DIAGNOSIS — G4733 Obstructive sleep apnea (adult) (pediatric): Secondary | ICD-10-CM | POA: Diagnosis not present

## 2023-11-20 ENCOUNTER — Telehealth: Payer: Self-pay

## 2023-11-20 ENCOUNTER — Other Ambulatory Visit (HOSPITAL_COMMUNITY): Payer: Self-pay

## 2023-11-20 ENCOUNTER — Encounter: Payer: Self-pay | Admitting: Family Medicine

## 2023-11-20 ENCOUNTER — Ambulatory Visit (INDEPENDENT_AMBULATORY_CARE_PROVIDER_SITE_OTHER): Admitting: Family Medicine

## 2023-11-20 VITALS — BP 117/65 | HR 60 | Ht 63.0 in | Wt 190.0 lb

## 2023-11-20 DIAGNOSIS — M5412 Radiculopathy, cervical region: Secondary | ICD-10-CM

## 2023-11-20 DIAGNOSIS — E66811 Obesity, class 1: Secondary | ICD-10-CM

## 2023-11-20 DIAGNOSIS — G4733 Obstructive sleep apnea (adult) (pediatric): Secondary | ICD-10-CM

## 2023-11-20 DIAGNOSIS — K760 Fatty (change of) liver, not elsewhere classified: Secondary | ICD-10-CM

## 2023-11-20 LAB — BAYER DCA HB A1C WAIVED: HB A1C (BAYER DCA - WAIVED): 5.5 % (ref 4.8–5.6)

## 2023-11-20 MED ORDER — TIRZEPATIDE-WEIGHT MANAGEMENT 7.5 MG/0.5ML ~~LOC~~ SOLN
7.5000 mg | SUBCUTANEOUS | 0 refills | Status: DC
Start: 2023-11-20 — End: 2024-02-18

## 2023-11-20 MED ORDER — TIRZEPATIDE-WEIGHT MANAGEMENT 10 MG/0.5ML ~~LOC~~ SOLN
10.0000 mg | SUBCUTANEOUS | 0 refills | Status: DC
Start: 2023-11-20 — End: 2024-02-18

## 2023-11-20 MED ORDER — TIRZEPATIDE-WEIGHT MANAGEMENT 2.5 MG/0.5ML ~~LOC~~ SOLN
2.5000 mg | SUBCUTANEOUS | 0 refills | Status: DC
Start: 2023-11-20 — End: 2024-02-18

## 2023-11-20 MED ORDER — TIRZEPATIDE-WEIGHT MANAGEMENT 5 MG/0.5ML ~~LOC~~ SOLN
5.0000 mg | SUBCUTANEOUS | 0 refills | Status: DC
Start: 2023-11-20 — End: 2024-02-18

## 2023-11-20 NOTE — Telephone Encounter (Signed)
 Copied from CRM [id number]. Topic: General - Other >> Nov 20, 2023  2:11 PM Antwanette L wrote: Reason for CRM: Hilliard Loyal from [facility] is calling to let Dr. Steen Eden know that they approved coverage for Zepbound from 11/20/23 to 11/19/24. If Dr. Steen Eden has any questions, please call [facility] Customer Service at [phone] opt [phone]

## 2023-11-20 NOTE — Telephone Encounter (Signed)
 Pharmacy Patient Advocate Encounter  Received notification from [facility] that Prior Authorization for Zepbound 2.5MG /0.5ML pen-injectors has been APPROVED from 11/20/23 to 11/19/24. Ran test claim, Copay is $680.76. This test claim was processed through [facility]- copay amounts may vary at other pharmacies due to pharmacy/plan contracts, or as the patient moves through the different stages of their insurance plan.  Per plan-MAX QTY OF 2.000 OVER 135 DAYS  PA #/Case ID/Reference #: [id number]

## 2023-11-20 NOTE — Telephone Encounter (Signed)
 Pt aware of approval.

## 2023-11-20 NOTE — Progress Notes (Signed)
 BP 117/65   Pulse 60   Ht 5\' 3"  (1.6 m)   Wt 190 lb (86.2 kg)   LMP 11/26/2010   SpO2 97%   BMI 33.66 kg/m    Subjective:   Patient ID: Kayla Romero, female    DOB: [birth year], 68 y.o.   MRN: [id number]  HPI: Kayla Romero is a 68 y.o. female presenting on 11/20/2023 for Medical Management of Chronic Issues, Weight Management Screening, and Tingling (Left arm. Started one week ago.)   HPI Obesity Patient is coming to discuss obesity weight loss.  She has been struggling with the weight and struggling to lose weight and has been gaining.  She has been around 180s and 190s and creeping up slowly over the past year.  She has tried to watch her appetite and eat right protein and fruits and vegetables and then she just feels hungry all the time afterwards.  She tries not to eat as much again but she still just feels hungry.  She would like to try something to help.  Patient also has obstructive sleep apnea and uses CPAP  Patient has a little bit of tingling and numbness in her left arm that comes up to her shoulder all the way down to the hand that been going on for about a week.  She still has good sensation and grip and strength.  She says it tingles up a little bit.    Relevant past medical, surgical, family and social history reviewed and updated as indicated. Interim medical history since our last visit reviewed. Allergies and medications reviewed and updated.  Review of Systems  Constitutional:  Negative for chills and fever.  Eyes:  Negative for visual disturbance.  Respiratory:  Negative for chest tightness and shortness of breath.   Cardiovascular:  Negative for chest pain and leg swelling.  Genitourinary:  Negative for difficulty urinating and dysuria.  Musculoskeletal:  Negative for back pain and gait problem.  Skin:  Negative for rash.  Neurological:  Negative for dizziness, light-headedness and headaches.  Psychiatric/Behavioral:  Negative for agitation and behavioral  problems.   All other systems reviewed and are negative.   Per HPI unless specifically indicated above   Allergies as of 11/20/2023   No Known Allergies      Medication List        Accurate as of Nov 20, 2023 11:05 AM. If you have any questions, ask your nurse or doctor.          STOP taking these medications    HYDROcodone  bit-homatropine 5-1.5 MG/5ML syrup Commonly known as: HYCODAN Stopped by: Lucio Sabin Isami Mehra       TAKE these medications    ALPRAZolam  0.25 MG tablet Commonly known as: XANAX  Take 2 tablets (0.5 mg total) by mouth at bedtime as needed (panic attack).   FLUoxetine  20 MG capsule Commonly known as: PROZAC  Take 1 capsule (20 mg total) by mouth daily.   fluticasone  50 MCG/ACT nasal spray Commonly known as: FLONASE  SPRAY 2 SPRAYS INTO EACH NOSTRIL EVERY DAY   levothyroxine  50 MCG tablet Commonly known as: SYNTHROID  Take 1 tablet (50 mcg total) by mouth daily.   SYSTANE ULTRA OP Place 1 drop into both eyes every evening.   tirzepatide 2.5 MG/0.5ML injection vial Commonly known as: ZEPBOUND Inject 2.5 mg into the skin once a week. Started by: Lucio Sabin Oluwasemilore Bahl   tirzepatide 5 MG/0.5ML injection vial Inject 5 mg into the skin once a week. Started by: Lucio Sabin  Kambre Messner   tirzepatide 7.5 MG/0.5ML injection vial Inject 7.5 mg into the skin once a week. Started by: Lucio Sabin Aryeh Butterfield   tirzepatide 10 MG/0.5ML injection vial Inject 10 mg into the skin once a week. Started by: Lucio Sabin Augusten Lipkin   Vitamin D3 50 MCG (2000 UT) Tabs Take 2,000 Units by mouth in the morning.         Objective:   BP 117/65   Pulse 60   Ht 5\' 3"  (1.6 m)   Wt 190 lb (86.2 kg)   LMP 11/26/2010   SpO2 97%   BMI 33.66 kg/m   Wt Readings from Last 3 Encounters:  11/20/23 190 lb (86.2 kg)  10/20/23 189 lb (85.7 kg)  10/20/23 189 lb 12.8 oz (86.1 kg)    Physical Exam Vitals and nursing note reviewed.  Constitutional:      General: She is not in  acute distress.    Appearance: She is well-developed. She is not diaphoretic.  Eyes:     Conjunctiva/sclera: Conjunctivae normal.  Cardiovascular:     Rate and Rhythm: Normal rate and regular rhythm.     Heart sounds: Normal heart sounds. No murmur heard. Pulmonary:     Effort: Pulmonary effort is normal. No respiratory distress.     Breath sounds: Normal breath sounds. No wheezing.  Abdominal:     General: Abdomen is flat. Bowel sounds are normal. There is no distension.     Palpations: Abdomen is soft.     Tenderness: There is no abdominal tenderness. There is no guarding or rebound.  Musculoskeletal:        General: No tenderness. Normal range of motion.  Skin:    General: Skin is warm and dry.     Findings: No rash.  Neurological:     Mental Status: She is alert and oriented to person, place, and time.     Coordination: Coordination normal.  Psychiatric:        Behavior: Behavior normal.       Assessment & Plan:   Problem List Items Addressed This Visit       Respiratory   Obstructive sleep apnea   Relevant Medications   tirzepatide (ZEPBOUND) 2.5 MG/0.5ML injection vial   tirzepatide 5 MG/0.5ML injection vial   tirzepatide 7.5 MG/0.5ML injection vial   tirzepatide 10 MG/0.5ML injection vial     Digestive   Fatty liver   Relevant Medications   tirzepatide (ZEPBOUND) 2.5 MG/0.5ML injection vial   tirzepatide 5 MG/0.5ML injection vial   tirzepatide 7.5 MG/0.5ML injection vial   tirzepatide 10 MG/0.5ML injection vial   Other Relevant Orders   Bayer DCA Hb A1c Waived   CMP14+EGFR     Other   Obesity (BMI 30.0-34.9) - Primary   Relevant Medications   tirzepatide (ZEPBOUND) 2.5 MG/0.5ML injection vial   tirzepatide 5 MG/0.5ML injection vial   tirzepatide 7.5 MG/0.5ML injection vial   tirzepatide 10 MG/0.5ML injection vial   Other Relevant Orders   Bayer DCA Hb A1c Waived   Other Visit Diagnoses       Cervical radiculopathy       Relevant Medications    tirzepatide (ZEPBOUND) 2.5 MG/0.5ML injection vial   tirzepatide 5 MG/0.5ML injection vial   tirzepatide 7.5 MG/0.5ML injection vial   tirzepatide 10 MG/0.5ML injection vial       Patient's BMI is >30 mg/m2.  Patient's current BMI is Body mass index is 33.66 kg/m.Aaron Aas  Patient is currently enrolled in a healthy eating plan along  with encouraged exercise.  Patient's would not tolerate a stimulant and Contrave would not be covered by her insurance.  Patient has contraindications to phentermine, Contrave & Qsymia (contains phentermine).  Patient does not have a personal or family history of medullary thyroid  carcinoma (MTC) or Multiple Endocrine Neoplasia syndrome type 2 (MEN 2).  Will try for Zepbound  Follow up plan: Return in about 3 months (around 02/20/2024), or if symptoms worsen or fail to improve, for Weight loss and obesity recheck.  Counseling provided for all of the vaccine components Orders Placed This Encounter  Procedures   Bayer [facility] Hb A1c Waived   CMP14+EGFR    Jolyne Needs, MD [facility] 11/20/2023, 11:05 AM

## 2023-11-20 NOTE — Telephone Encounter (Signed)
 Pharmacy Patient Advocate Encounter   Received notification from CoverMyMeds that prior authorization for Zepbound 2.5MG /0.5ML pen-injectors is required/requested.   Insurance verification completed.   The patient is insured through [employer] .   Per test claim: PA required; PA submitted to above mentioned insurance via CoverMyMeds Key/confirmation #/EOC [id number] Status is pending

## 2023-11-21 LAB — CMP14+EGFR
ALT: 39 IU/L — ABNORMAL HIGH (ref 0–32)
AST: 38 IU/L (ref 0–40)
Albumin: 4.4 g/dL (ref 3.9–4.9)
Alkaline Phosphatase: 148 IU/L — ABNORMAL HIGH (ref 44–121)
BUN/Creatinine Ratio: 18 (ref 12–28)
BUN: 13 mg/dL (ref 8–27)
Bilirubin Total: 0.3 mg/dL (ref 0.0–1.2)
CO2: 21 mmol/L (ref 20–29)
Calcium: 9.6 mg/dL (ref 8.7–10.3)
Chloride: 103 mmol/L (ref 96–106)
Creatinine, Ser: 0.71 mg/dL (ref 0.57–1.00)
Globulin, Total: 2.7 g/dL (ref 1.5–4.5)
Glucose: 96 mg/dL (ref 70–99)
Potassium: 4.2 mmol/L (ref 3.5–5.2)
Sodium: 142 mmol/L (ref 134–144)
Total Protein: 7.1 g/dL (ref 6.0–8.5)
eGFR: 94 mL/min/{1.73_m2} (ref >59)

## 2023-11-22 DIAGNOSIS — H353132 Nonexudative age-related macular degeneration, bilateral, intermediate dry stage: Secondary | ICD-10-CM | POA: Diagnosis not present

## 2023-11-24 ENCOUNTER — Telehealth: Payer: Self-pay | Admitting: Family Medicine

## 2023-11-24 NOTE — Telephone Encounter (Signed)
 Pt cb, for status of cb, says can't afford the Zepbound at over 600.00 dollars, please cb for other alternatives or PA

## 2023-11-24 NOTE — Telephone Encounter (Unsigned)
 Copied from CRM [id number]. Topic: Clinical - Prescription Issue >> Nov 24, 2023 12:16 PM Felizardo Hotter wrote: Reason for CRM: Received call from [facility] per Terri ph:[phone] option 5, fax: [phone] regarding tirzepatide (ZEPBOUND) 2.5 MG/0.5ML injection vial, the lower tier was dismissed.

## 2023-11-25 NOTE — Telephone Encounter (Signed)
 Please forward to insurance at the lower tier was dismissed because she was already on higher dose of Wegovy so we are trying to transition onto a similar dose.

## 2023-11-27 ENCOUNTER — Ambulatory Visit: Payer: Self-pay | Admitting: Family Medicine

## 2023-12-15 ENCOUNTER — Telehealth: Payer: Self-pay

## 2023-12-15 NOTE — Telephone Encounter (Signed)
 Referral placed on 5/28 for weight management. Still pending. Please advise.

## 2023-12-15 NOTE — Telephone Encounter (Signed)
 Referral sent to: [facility] at [facility] [address] [address] [phone]  MyChart Message sent to Patient with Specialty Office contact information.

## 2023-12-22 DIAGNOSIS — H353132 Nonexudative age-related macular degeneration, bilateral, intermediate dry stage: Secondary | ICD-10-CM | POA: Diagnosis not present

## 2023-12-28 DIAGNOSIS — H5203 Hypermetropia, bilateral: Secondary | ICD-10-CM | POA: Diagnosis not present

## 2024-01-21 DIAGNOSIS — Z6834 Body mass index (BMI) 34.0-34.9, adult: Secondary | ICD-10-CM | POA: Diagnosis not present

## 2024-01-21 DIAGNOSIS — G4733 Obstructive sleep apnea (adult) (pediatric): Secondary | ICD-10-CM | POA: Diagnosis not present

## 2024-01-21 DIAGNOSIS — E66811 Obesity, class 1: Secondary | ICD-10-CM | POA: Diagnosis not present

## 2024-01-21 DIAGNOSIS — Z1330 Encounter for screening examination for mental health and behavioral disorders, unspecified: Secondary | ICD-10-CM | POA: Diagnosis not present

## 2024-01-21 DIAGNOSIS — H353132 Nonexudative age-related macular degeneration, bilateral, intermediate dry stage: Secondary | ICD-10-CM | POA: Diagnosis not present

## 2024-02-05 ENCOUNTER — Telehealth: Payer: Self-pay | Admitting: *Deleted

## 2024-02-05 DIAGNOSIS — K080 Exfoliation of teeth due to systemic causes: Secondary | ICD-10-CM | POA: Diagnosis not present

## 2024-02-05 NOTE — Telephone Encounter (Signed)
 Upon patient request DME selection is [place]. Patient understands he will be contacted by [facility] to set up his cpap. Patient understands to call if [facility] does not contact him with new setup in a timely manner. Patient understands they will be called once confirmation has been received from [facility]/ that they have received their new machine to schedule 10 week follow up appointment.   [facility] notified of new cpap order  Please add to airview Patient was grateful for the call and thanked me.

## 2024-02-16 DIAGNOSIS — G4733 Obstructive sleep apnea (adult) (pediatric): Secondary | ICD-10-CM | POA: Diagnosis not present

## 2024-02-18 ENCOUNTER — Encounter: Payer: Self-pay | Admitting: Family Medicine

## 2024-02-18 ENCOUNTER — Ambulatory Visit (INDEPENDENT_AMBULATORY_CARE_PROVIDER_SITE_OTHER): Payer: Medicare Other | Admitting: Family Medicine

## 2024-02-18 VITALS — BP 126/66 | HR 50 | Ht 63.0 in | Wt 184.0 lb

## 2024-02-18 DIAGNOSIS — F3341 Major depressive disorder, recurrent, in partial remission: Secondary | ICD-10-CM

## 2024-02-18 DIAGNOSIS — Z79899 Other long term (current) drug therapy: Secondary | ICD-10-CM

## 2024-02-18 DIAGNOSIS — F419 Anxiety disorder, unspecified: Secondary | ICD-10-CM

## 2024-02-18 DIAGNOSIS — E039 Hypothyroidism, unspecified: Secondary | ICD-10-CM | POA: Diagnosis not present

## 2024-02-18 LAB — LIPID PANEL

## 2024-02-18 MED ORDER — LEVOTHYROXINE SODIUM 50 MCG PO TABS: 50.0000 ug | ORAL_TABLET | Freq: Every day | ORAL | 3 refills | Status: AC

## 2024-02-18 NOTE — Progress Notes (Signed)
 BP 126/66   Pulse (!) 50   Ht 5' 3 (1.6 m)   Wt 184 lb (83.5 kg)   LMP 11/26/2010   SpO2 97%   BMI 32.59 kg/m    Subjective:   Patient ID: Kayla Romero, female    DOB: Mar 07, 1956, 68 y.o.   MRN: [id number]  HPI: AALEEYAH BIAS is a 68 y.o. female presenting on 02/18/2024 for Medical Management of Chronic Issues, Hypothyroidism, Anxiety, and Depression   Discussed the use of AI scribe software for clinical note transcription with the patient, who gave verbal consent to proceed.  History of Present Illness   Kayla Romero is a 68 year old female who presents for a recheck of her hypothyroidism.  She reports taking levothyroxine  50 mcg daily and has not noticed any changes in energy levels or other issues.  For her anxiety and depression, she maintains on fluoxetine  and uses alprazolam  0.25 mg twice daily as needed. She does not use alprazolam  daily, and a 60-day prescription often lasts her six months.  She has recently started topiramate 25 mg twice daily, which has helped reduce her cravings for sweets and contributed to a weight loss of seven pounds since January 22, 2024. She has resumed exercising and is focusing on eating more protein and fewer carbohydrates.  She signed a pain contract and completed a urine drug screen on August 26, 2023. The [place]  Drug Database was reviewed with no abnormalities noted.          Relevant past medical, surgical, family and social history reviewed and updated as indicated. Interim medical history since our last visit reviewed. Allergies and medications reviewed and updated.  Review of Systems  Constitutional:  Negative for chills and fever.  HENT:  Negative for congestion, ear discharge and ear pain.   Eyes:  Negative for redness and visual disturbance.  Respiratory:  Negative for chest tightness and shortness of breath.   Cardiovascular:  Negative for chest pain and leg swelling.  Genitourinary:  Negative for difficulty  urinating and dysuria.  Musculoskeletal:  Negative for back pain and gait problem.  Skin:  Negative for rash.  Neurological:  Negative for dizziness, light-headedness and headaches.  Psychiatric/Behavioral:  Negative for agitation and behavioral problems.   All other systems reviewed and are negative.   Per HPI unless specifically indicated above   Allergies as of 02/18/2024   No Known Allergies      Medication List        Accurate as of February 18, 2024 10:04 AM. If you have any questions, ask your nurse or doctor.          STOP taking these medications    tirzepatide  10 MG/0.5ML injection vial Stopped by: Fonda LABOR Teka Chanda   tirzepatide  2.5 MG/0.5ML injection vial Commonly known as: ZEPBOUND  Stopped by: Fonda LABOR Bernadene Garside   tirzepatide  5 MG/0.5ML injection vial Stopped by: Fonda LABOR Naimah Yingst   tirzepatide  7.5 MG/0.5ML injection vial Stopped by: Fonda LABOR Amarius Toto       TAKE these medications    ALPRAZolam  0.25 MG tablet Commonly known as: XANAX  Take 2 tablets (0.5 mg total) by mouth at bedtime as needed (panic attack).   FLUoxetine  20 MG capsule Commonly known as: PROZAC  Take 1 capsule (20 mg total) by mouth daily.   fluticasone  50 MCG/ACT nasal spray Commonly known as: FLONASE  SPRAY 2 SPRAYS INTO EACH NOSTRIL EVERY DAY   levothyroxine  50 MCG tablet Commonly known as: SYNTHROID  Take 1 tablet (50  mcg total) by mouth daily.   SYSTANE ULTRA OP Place 1 drop into both eyes every evening.   topiramate 25 MG tablet Commonly known as: TOPAMAX Take 25 mg by mouth 2 (two) times daily.   Vitamin D3 50 MCG (2000 UT) Tabs Take 2,000 Units by mouth in the morning.         Objective:   BP 126/66   Pulse (!) 50   Ht 5' 3 (1.6 m)   Wt 184 lb (83.5 kg)   LMP 11/26/2010   SpO2 97%   BMI 32.59 kg/m   Wt Readings from Last 3 Encounters:  02/18/24 184 lb (83.5 kg)  11/20/23 190 lb (86.2 kg)  10/20/23 189 lb (85.7 kg)    Physical Exam Physical  Exam   MEASUREMENTS: Weight- 191. NECK: Thyroid  normal, no thyromegaly. CHEST: Lungs clear to auscultation bilaterally. CARDIOVASCULAR: Regular rate and rhythm, no murmurs. Peripheral pulses intact. EXTREMITIES: No edema in lower extremities.         Assessment & Plan:   Problem List Items Addressed This Visit       Endocrine   Acquired hypothyroidism   Relevant Medications   levothyroxine  (SYNTHROID ) 50 MCG tablet   Other Relevant Orders   Lipid panel   TSH     Other   Anxiety - Primary   Relevant Orders   CMP14+EGFR   Lipid panel   Depression   Relevant Orders   CBC with Differential/Platelet   CMP14+EGFR   Lipid panel   Other Visit Diagnoses       Controlled substance agreement signed              Hypothyroidism Condition well-managed with levothyroxine  50 mcg daily. - Continue levothyroxine  50 mcg daily. - Order thyroid  function tests.  Anxiety and depression Managed with fluoxetine  and infrequent use of alprazolam . - Continue fluoxetine . - Monitor alprazolam  use and refill as needed.  Obesity Managed with lifestyle changes and topiramate 25 mg BID, resulting in weight loss and reduced cravings. - Continue topiramate 25 mg twice a day. - Encourage exercise and dietary modifications. - Follow up with [facility] on September 21st.          Follow up plan: Return in about 6 months (around 08/20/2024), or if symptoms worsen or fail to improve, for Physical exam of hypothyroidism and anxiety.  Counseling provided for all of the vaccine components Orders Placed This Encounter  Procedures   CBC with Differential/Platelet   CMP14+EGFR   Lipid panel   TSH    Fonda Levins, MD [facility] 02/18/2024, 10:04 AM

## 2024-02-19 LAB — LIPID PANEL
Cholesterol, Total: 178 mg/dL (ref 100–199)
HDL: 52 mg/dL (ref >39)
LDL CALC COMMENT:: 3.4 ratio (ref 0.0–4.4)
LDL Chol Calc (NIH): 111 mg/dL — AB (ref 0–99)
Triglycerides: 78 mg/dL (ref 0–149)
VLDL Cholesterol Cal: 15 mg/dL (ref 5–40)

## 2024-02-19 LAB — CBC WITH DIFFERENTIAL/PLATELET
Basophils Absolute: 0 x10E3/uL (ref 0.0–0.2)
Basos: 1 %
EOS (ABSOLUTE): 0.1 x10E3/uL (ref 0.0–0.4)
Eos: 2 %
Hematocrit: 40.9 % (ref 34.0–46.6)
Hemoglobin: 13.4 g/dL (ref 11.1–15.9)
Immature Grans (Abs): 0 x10E3/uL (ref 0.0–0.1)
Immature Granulocytes: 0 %
Lymphocytes Absolute: 2.1 x10E3/uL (ref 0.7–3.1)
Lymphs: 36 %
MCH: 30.1 pg (ref 26.6–33.0)
MCHC: 32.8 g/dL (ref 31.5–35.7)
MCV: 92 fL (ref 79–97)
Monocytes Absolute: 0.5 x10E3/uL (ref 0.1–0.9)
Monocytes: 8 %
Neutrophils Absolute: 3 x10E3/uL (ref 1.4–7.0)
Neutrophils: 53 %
Platelets: 307 x10E3/uL (ref 150–450)
RBC: 4.45 x10E6/uL (ref 3.77–5.28)
RDW: 12.7 % (ref 11.7–15.4)
WBC: 5.7 x10E3/uL (ref 3.4–10.8)

## 2024-02-19 LAB — CMP14+EGFR
ALT: 23 IU/L (ref 0–32)
AST: 26 IU/L (ref 0–40)
Albumin: 4.4 g/dL (ref 3.9–4.9)
Alkaline Phosphatase: 132 IU/L — AB (ref 44–121)
BUN/Creatinine Ratio: 18 (ref 12–28)
BUN: 14 mg/dL (ref 8–27)
Bilirubin Total: 0.6 mg/dL (ref 0.0–1.2)
CO2: 20 mmol/L (ref 20–29)
Calcium: 9.4 mg/dL (ref 8.7–10.3)
Chloride: 106 mmol/L (ref 96–106)
Creatinine, Ser: 0.78 mg/dL (ref 0.57–1.00)
Globulin, Total: 2.6 g/dL (ref 1.5–4.5)
Glucose: 95 mg/dL (ref 70–99)
Potassium: 4.5 mmol/L (ref 3.5–5.2)
Sodium: 140 mmol/L (ref 134–144)
Total Protein: 7 g/dL (ref 6.0–8.5)
eGFR: 83 mL/min/1.73 (ref >59)

## 2024-02-19 LAB — TSH: TSH: 2.13 u[IU]/mL (ref 0.450–4.500)

## 2024-02-20 DIAGNOSIS — H353132 Nonexudative age-related macular degeneration, bilateral, intermediate dry stage: Secondary | ICD-10-CM | POA: Diagnosis not present

## 2024-02-25 ENCOUNTER — Other Ambulatory Visit: Payer: Self-pay | Admitting: Family Medicine

## 2024-02-25 ENCOUNTER — Ambulatory Visit: Payer: Self-pay | Admitting: Family Medicine

## 2024-02-25 DIAGNOSIS — F419 Anxiety disorder, unspecified: Secondary | ICD-10-CM

## 2024-02-25 DIAGNOSIS — F3341 Major depressive disorder, recurrent, in partial remission: Secondary | ICD-10-CM

## 2024-02-25 DIAGNOSIS — Z79899 Other long term (current) drug therapy: Secondary | ICD-10-CM

## 2024-03-08 ENCOUNTER — Ambulatory Visit

## 2024-03-21 DIAGNOSIS — H353132 Nonexudative age-related macular degeneration, bilateral, intermediate dry stage: Secondary | ICD-10-CM | POA: Diagnosis not present

## 2024-03-23 DIAGNOSIS — Z6833 Body mass index (BMI) 33.0-33.9, adult: Secondary | ICD-10-CM | POA: Diagnosis not present

## 2024-03-23 DIAGNOSIS — E66811 Obesity, class 1: Secondary | ICD-10-CM | POA: Diagnosis not present

## 2024-03-23 DIAGNOSIS — G4733 Obstructive sleep apnea (adult) (pediatric): Secondary | ICD-10-CM | POA: Diagnosis not present

## 2024-04-13 ENCOUNTER — Other Ambulatory Visit: Payer: Self-pay

## 2024-04-13 ENCOUNTER — Telehealth: Payer: Self-pay

## 2024-04-13 ENCOUNTER — Ambulatory Visit

## 2024-04-13 VITALS — BP 126/66 | HR 50 | Ht 63.0 in | Wt 184.0 lb

## 2024-04-13 DIAGNOSIS — Z87891 Personal history of nicotine dependence: Secondary | ICD-10-CM

## 2024-04-13 DIAGNOSIS — Z0000 Encounter for general adult medical examination without abnormal findings: Secondary | ICD-10-CM | POA: Diagnosis not present

## 2024-04-13 DIAGNOSIS — Z122 Encounter for screening for malignant neoplasm of respiratory organs: Secondary | ICD-10-CM

## 2024-04-13 NOTE — Telephone Encounter (Signed)
 Lung Cancer Screening Narrative/Criteria Questionnaire (Cigarette Smokers Only- No Cigars/Pipes/vapes)   Kayla Romero   SDMV:04/20/2024 10:00 am [place]       06-03-1956               LDCT: 04/26/2024 at 10:30 DWB     68 y.o.   Phone: [phone]  Lung Screening Narrative (confirm age 62-77 yrs Medicare / 50-80 yrs Private pay insurance)   Insurance information: BCBS   Referring Provider: [place]   This screening involves an initial phone call with a team member from our program. It is called a shared decision making visit. The initial meeting is required by  insurance and Medicare to make sure you understand the program. This appointment takes about 15-20 minutes to complete. You will complete the screening scan at your scheduled date/time.  This scan takes about 5-10 minutes to complete. You can eat and drink normally before and after the scan.  Criteria questions for Lung Cancer Screening:   Are you a current or former smoker? Former Age began smoking: 21   If you are a former smoker, what year did you quit smoking? Quit 2016 and additional 5 years (within 15 yrs)   To calculate your smoking history, I need an accurate estimate of how many packs of cigarettes you smoked per day and for how many years. (Not just the number of PPD you are now smoking)   Years smoking 33 x Packs per day 2 = Pack years 62   (at least 20 pack yrs)   (Make sure they understand that we need to know how much they have smoked in the past, not just the number of PPD they are smoking now)  Do you have a personal history of cancer?  No    Do you have a family history of cancer? Yes  (cancer type and and relative) Brother had oral/throat cancer.    Are you coughing up blood?  No  Have you had unexplained weight loss of 15 lbs or more in the last 6 months? No  It looks like you meet all criteria.  When would be a good time for us  to schedule you for this screening?   Additional information: N/A

## 2024-04-13 NOTE — Patient Instructions (Signed)
 Ms. Fehr,  Thank you for taking the time for your Medicare Wellness Visit. I appreciate your continued commitment to your health goals. Please review the care plan we discussed, and feel free to reach out if I can assist you further.  Medicare recommends these wellness visits once per year to help you and your care team stay ahead of potential health issues. These visits are designed to focus on prevention, allowing your provider to concentrate on managing your acute and chronic conditions during your regular appointments.  Please note that Annual Wellness Visits do not include a physical exam. Some assessments may be limited, especially if the visit was conducted virtually. If needed, we may recommend a separate in-person follow-up with your provider.  Ongoing Care Seeing your primary care provider every 3 to 6 months helps us  monitor your health and provide consistent, personalized care.   Referrals If a referral was made during today's visit and you haven't received any updates within two weeks, please contact the referred provider directly to check on the status.  Recommended Screenings:  Health Maintenance  Topic Date Due   Medicare Annual Wellness Visit  10/15/2023   Flu Shot  01/29/2024   COVID-19 Vaccine (4 - 2025-26 season) 02/29/2024   Screening for Lung Cancer  03/26/2024   Breast Cancer Screening  06/08/2024   DTaP/Tdap/Td vaccine (4 - Td or Tdap) 09/05/2027   Colon Cancer Screening  05/29/2029   Pneumococcal Vaccine for age over 33  Completed   DEXA scan (bone density measurement)  Completed   Hepatitis C Screening  Completed   Zoster (Shingles) Vaccine  Completed   Meningitis B Vaccine  Aged Out   Hepatitis B Vaccine  Discontinued       04/13/2024    8:55 AM  Advanced Directives  Does Patient Have a Medical Advance Directive? No   Advance Care Planning is important because it: Ensures you receive medical care that aligns with your values, goals, and  preferences. Provides guidance to your family and loved ones, reducing the emotional burden of decision-making during critical moments.  Vision: Annual vision screenings are recommended for early detection of glaucoma, cataracts, and diabetic retinopathy. These exams can also reveal signs of chronic conditions such as diabetes and high blood pressure.  Dental: Annual dental screenings help detect early signs of oral cancer, gum disease, and other conditions linked to overall health, including heart disease and diabetes.  Please see the attached documents for additional preventive care recommendations.

## 2024-04-13 NOTE — Progress Notes (Signed)
 Subjective:   Kayla Romero is a 68 y.o. who presents for a Medicare Wellness preventive visit.  As a reminder, Annual Wellness Visits don't include a physical exam, and some assessments may be limited, especially if this visit is performed virtually. We may recommend an in-person follow-up visit with your provider if needed.  Visit Complete: Virtual I connected with  Kayla Romero on 04/13/24 by a audio enabled telemedicine application and verified that I am speaking with the correct person using two identifiers.  Patient Location: Home  Provider Location: Home Office  I discussed the limitations of evaluation and management by telemedicine. The patient expressed understanding and agreed to proceed.  Vital Signs: Because this visit was a virtual/telehealth visit, some criteria may be missing or patient reported. Any vitals not documented were not able to be obtained and vitals that have been documented are patient reported.  VideoDeclined- This patient declined Librarian, academic. Therefore the visit was completed with audio only.  Persons Participating in Visit: Patient.  AWV Questionnaire: No: Patient Medicare AWV questionnaire was not completed prior to this visit.  Cardiac Risk Factors include: advanced age (>35men, >21 women);smoking/ tobacco exposure;obesity (BMI >30kg/m2)     Objective:    Today's Vitals   04/13/24 0852  BP: 126/66  Pulse: (!) 50  Weight: 184 lb (83.5 kg)  Height: 5' 3 (1.6 m)   Body mass index is 32.59 kg/m.     04/13/2024    8:55 AM 10/15/2022   10:42 AM 11/21/2021   10:21 PM 09/16/2021   11:20 AM 09/09/2021   12:32 PM 04/08/2021   11:22 AM 09/01/2012   10:11 PM  Advanced Directives  Does Patient Have a Medical Advance Directive? No No No No No No Patient does not have advance directive   Would patient like information on creating a medical advance directive?  No - Patient declined No - Patient declined   No -  Patient declined   Pre-existing out of facility DNR order (yellow form or pink MOST form)       No      Data saved with a previous flowsheet row definition    Current Medications (verified) Outpatient Encounter Medications as of 04/13/2024  Medication Sig   ALPRAZolam  (XANAX ) 0.25 MG tablet Take 2 tablets (0.5 mg total) by mouth at bedtime as needed (panic attack).   Cholecalciferol (VITAMIN D3) 50 MCG (2000 UT) TABS Take 2,000 Units by mouth in the morning.   FLUoxetine  (PROZAC ) 20 MG capsule Take 1 capsule (20 mg total) by mouth daily.   fluticasone  (FLONASE ) 50 MCG/ACT nasal spray SPRAY 2 SPRAYS INTO EACH NOSTRIL EVERY DAY   levothyroxine  (SYNTHROID ) 50 MCG tablet Take 1 tablet (50 mcg total) by mouth daily.   Polyethyl Glycol-Propyl Glycol (SYSTANE ULTRA OP) Place 1 drop into both eyes every evening.   topiramate (TOPAMAX) 25 MG tablet Take 25 mg by mouth 2 (two) times daily.   No facility-administered encounter medications on file as of 04/13/2024.    Allergies (verified) Patient has no known allergies.   History: Past Medical History:  Diagnosis Date   Anxiety    h/o, recently has not had to use xanax  much.   Cyst of thymus gland    being monitored   Depression    Dysrhythmia 2021   PVC   Fatty liver    History of EKG    first presented /w L shoulder pain, 2007, seen by [facility] ,  had EKG then & it was wnl.     Macular degeneration    Mediastinal mass    Obesity (BMI 30-39.9)    PVC's (premature ventricular contractions)    RUQ pain    Sleep apnea    Thymus, cyst    first seen on 2007, had some care with Dr. Brantley & then a Bryna MD fr. [facility]     Thymus, cyst    diagnosed approx. 2007, seen by Dr. Brantley, but now followed by [facility] in [place]   Vitamin D  deficiency    Past Surgical History:  Procedure Laterality Date   CHOLECYSTECTOMY N/A 08/31/2012   Procedure: LAPAROSCOPIC CHOLECYSTECTOMY possible IOC ;  Surgeon: Donnice Bury, MD;  Location: [facility] OR;  Service: General;  Laterality: N/A;   EYE SURGERY  2008   bilateral removal of cataracts, /w IOL   KNEE ARTHROSCOPY WITH MEDIAL MENISECTOMY Left 09/09/2021   Procedure: KNEE ARTHROSCOPY WITH PARTIAL MEDIAL MENISECTOMY;  Surgeon: Sharl Selinda Dover, MD;  Location: [facility];  Service: Orthopedics;  Laterality: Left;   TUBAL LIGATION  1988   Family History  Problem Relation Age of Onset   Heart disease Mother    Cirrhosis Mother    Kidney disease Mother    Cancer Mother        ovarian   Aneurysm Father    Social History   Socioeconomic History   Marital status: Married    Spouse name: Not on file   Number of children: Not on file   Years of education: Not on file   Highest education level: GED or equivalent  Occupational History   Not on file  Tobacco Use   Smoking status: Former    Current packs/day: 0.00    Average packs/day: 1 pack/day for 20.0 years (20.0 ttl pk-yrs)    Types: Cigarettes    Start date: 05/31/1995    Quit date: 05/31/2015    Years since quitting: 8.8   Smokeless tobacco: Never   Tobacco comments:    quit 2 weeks ago completely. had quit in 2012 but restarted  Vaping Use   Vaping status: Never Used  Substance and Sexual Activity   Alcohol use: No   Drug use: No   Sexual activity: Not on file  Other Topics Concern   Not on file  Social History Narrative   Not on file   Social Drivers of Health   Financial Resource Strain: Low Risk  (04/13/2024)   Overall Financial Resource Strain (CARDIA)    Difficulty of Paying Living Expenses: Not very hard  Food Insecurity: No Food Insecurity (04/13/2024)   Hunger Vital Sign    Worried About Running Out of Food in the Last Year: Never true    Ran Out of Food in the Last Year: Never true  Transportation Needs: No Transportation Needs (04/13/2024)   PRAPARE - Administrator, Civil Service (Medical): No    Lack of Transportation (Non-Medical): No   Physical Activity: Insufficiently Active (04/13/2024)   Exercise Vital Sign    Days of Exercise per Week: 3 days    Minutes of Exercise per Session: 40 min  Stress: No Stress Concern Present (04/13/2024)   [employer] of Occupational Health - Occupational Stress Questionnaire    Feeling of Stress: Not at all  Social Connections: Moderately Integrated (04/13/2024)   Social Connection and Isolation Panel    Frequency of Communication with Friends and Family: Three times a week  Frequency of Social Gatherings with Friends and Family: Three times a week    Attends Religious Services: 1 to 4 times per year    Active Member of Clubs or Organizations: No    Attends Engineer, structural: Not on file    Marital Status: Married    Tobacco Counseling Counseling given: Yes Tobacco comments: quit 2 weeks ago completely. had quit in 2012 but restarted    Clinical Intake:  Pre-visit preparation completed: Yes  Pain : No/denies pain     BMI - recorded: 32.59 Nutritional Status: BMI > 30  Obese Nutritional Risks: None Diabetes: No  Lab Results  Component Value Date   HGBA1C 5.5 11/20/2023   HGBA1C 5.2% 10/18/2013     How often do you need to have someone help you when you read instructions, pamphlets, or other written materials from your doctor or pharmacy?: 1 - Never  Interpreter Needed?: No  Information entered by :: alia t/cma   Activities of Daily Living     04/13/2024    8:55 AM  In your present state of health, do you have any difficulty performing the following activities:  Hearing? 0  Vision? 0  Difficulty concentrating or making decisions? 0  Walking or climbing stairs? 0  Dressing or bathing? 0  Doing errands, shopping? 0  Preparing Food and eating ? N  Using the Toilet? N  In the past six months, have you accidently leaked urine? N  Do you have problems with loss of bowel control? N  Managing your Medications? N  Managing your Finances? N   Housekeeping or managing your Housekeeping? N    Patient Care Team: Dettinger, Fonda LABOR, MD as PCP - General (Family Medicine) Court Dorn PARAS, MD as PCP - Cardiology (Cardiology) [facility], [facility]  I have updated your Care Teams any recent Medical Services you may have received from other providers in the past year.     Assessment:   This is a routine wellness examination for Pitt[place].  Hearing/Vision screen Hearing Screening - Comments:: Pt denies hearing dif Vision Screening - Comments:: Pt use reading glasses/pt goes [facility] in [place], /last 2025   Goals Addressed   None    Depression Screen     04/13/2024    8:58 AM 02/18/2024    9:35 AM 11/20/2023   10:33 AM 10/20/2023    3:06 PM 08/21/2023    9:41 AM 02/18/2023   10:17 AM 10/15/2022   10:35 AM  PHQ 2/9 Scores  PHQ - 2 Score 0 0 0 0 0 0 0  PHQ- 9 Score 0 0    0     Fall Risk     04/13/2024    8:54 AM 02/18/2024    9:35 AM 11/20/2023   10:33 AM 10/20/2023    3:06 PM 08/21/2023    9:41 AM  Fall Risk   Falls in the past year? 0 0 0 0 0  Number falls in past yr: 0 0 0    Injury with Fall? 0 0 0    Risk for fall due to : No Fall Risks No Fall Risks No Fall Risks    Follow up Falls evaluation completed Falls evaluation completed Falls evaluation completed      MEDICARE RISK AT HOME:  Medicare Risk at Home Any stairs in or around the home?: Yes If so, are there any without handrails?: Yes Home free of loose throw rugs in walkways, pet beds, electrical cords, etc?:  Yes Adequate lighting in your home to reduce risk of falls?: Yes Life alert?: No Use of a cane, walker or w/c?: No Grab bars in the bathroom?: No Shower chair or bench in shower?: No Elevated toilet seat or a handicapped toilet?: No  TIMED UP AND GO:  Was the test performed?  no  Cognitive Function: 6CIT completed        04/13/2024    9:00 AM 10/15/2022   10:44 AM  6CIT Screen  What Year? 0 points 0 points  What month? 0  points 0 points  What time? 0 points 0 points  Count back from 20 0 points 0 points  Months in reverse 0 points 0 points  Repeat phrase 0 points 0 points  Total Score 0 points 0 points    Immunizations Immunization History  Administered Date(s) Administered   DTaP 05/02/1962, 12/29/1962   Fluad Quad(high Dose 65+) 04/14/2022   Hepatitis B 10/02/2017, 10/02/2017, 01/22/2018   Hepatitis B, ADULT 01/22/2018   IPV 05/06/1961, 06/03/1961, 12/30/1961, 12/30/1962   Influenza, Quadrivalent, Recombinant, Inj, Pf 04/21/2019   Influenza,inj,Quad PF,6+ Mos 04/20/2014, 04/12/2015, 04/21/2016, 04/01/2017, 04/07/2018, 04/18/2020, 04/03/2021   Influenza-Unspecified 04/30/2013, 04/01/2017, 04/07/2018, 04/20/2023   MMR 09/04/2017, 10/05/2017   Moderna SARS-COV2 Booster Vaccination 01/26/2021   Moderna Sars-Covid-2 Vaccination 09/08/2019, 10/08/2019, 05/19/2020   PNEUMOCOCCAL CONJUGATE-20 02/18/2023   Pneumococcal Conjugate-13 02/26/2022   Smallpox 05/06/1961   Tdap 09/04/2017   Vaccinia,smallpox Monkeypox Vaccine Live,pf 05/06/1961   Zoster Recombinant(Shingrix) 02/12/2021, 07/11/2021    Screening Tests Health Maintenance  Topic Date Due   Influenza Vaccine  01/29/2024   COVID-19 Vaccine (4 - 2025-26 season) 02/29/2024   Lung Cancer Screening  03/26/2024   Mammogram  06/08/2024   Medicare Annual Wellness (AWV)  04/13/2025   DTaP/Tdap/Td (4 - Td or Tdap) 09/05/2027   Colonoscopy  05/29/2029   Pneumococcal Vaccine: 50+ Years  Completed   DEXA SCAN  Completed   Hepatitis C Screening  Completed   Zoster Vaccines- Shingrix  Completed   Meningococcal B Vaccine  Aged Out   Hepatitis B Vaccines 19-59 Average Risk  Discontinued    Health Maintenance Items Addressed: See Nurse Notes at the end of this note  Additional Screening:  Vision Screening: Recommended annual ophthalmology exams for early detection of glaucoma and other disorders of the eye. Is the patient up to date with their  annual eye exam?  Yes  Who is the provider or what is the name of the office in which the patient attends annual eye exams? [facility]  Dental Screening: Recommended annual dental exams for proper oral hygiene  Community Resource Referral / Chronic Care Management: CRR required this visit?  No   CCM required this visit?  No   Plan:    I have personally reviewed and noted the following in the patient's chart:   Medical and social history Use of alcohol, tobacco or illicit drugs  Current medications and supplements including opioid prescriptions. Patient is not currently taking opioid prescriptions. Functional ability and status Nutritional status Physical activity Advanced directives List of other physicians Hospitalizations, surgeries, and ER visits in previous 12 months Vitals Screenings to include cognitive, depression, and falls Referrals and appointments  In addition, I have reviewed and discussed with patient certain preventive protocols, quality metrics, and best practice recommendations. A written personalized care plan for preventive services as well as general preventive health recommendations were provided to patient.   Ozie Ned, CMA   04/13/2024   After  Visit Summary: (MyChart) Due to this being a telephonic visit, the after visit summary with patients personalized plan was offered to patient via MyChart   Notes: PCP Follow Up Recommendations: pt is aware and due for the following: Covid, flu vaccines/lung cancer screening-ordered, mammogram-schedule

## 2024-04-15 ENCOUNTER — Other Ambulatory Visit: Payer: Self-pay | Admitting: Medical Genetics

## 2024-04-18 ENCOUNTER — Other Ambulatory Visit (HOSPITAL_COMMUNITY)

## 2024-04-19 ENCOUNTER — Other Ambulatory Visit (HOSPITAL_COMMUNITY)
Admission: RE | Admit: 2024-04-19 | Discharge: 2024-04-19 | Disposition: A | Discharge status: Home / Self Care | Payer: Self-pay | Source: Ambulatory Visit | Attending: Oncology | Admitting: Oncology

## 2024-04-20 ENCOUNTER — Encounter: Payer: Self-pay | Admitting: *Deleted

## 2024-04-20 ENCOUNTER — Ambulatory Visit: Admitting: *Deleted

## 2024-04-20 DIAGNOSIS — H353132 Nonexudative age-related macular degeneration, bilateral, intermediate dry stage: Secondary | ICD-10-CM | POA: Diagnosis not present

## 2024-04-20 DIAGNOSIS — Z87891 Personal history of nicotine dependence: Secondary | ICD-10-CM | POA: Diagnosis not present

## 2024-04-20 NOTE — Progress Notes (Signed)
 Virtual Visit via Telephone Note  I connected with Kayla Romero on 04/20/24 at 10:00 AM EDT by telephone and verified that I am speaking with the correct person using two identifiers.  Location: Patient: at home Provider: [address], [place], [place], [address]    I discussed the limitations, risks, security and privacy concerns of performing an evaluation and management service by telephone and the availability of in person appointments. I also discussed with the patient that there may be a patient responsible charge related to this service. The patient expressed understanding and agreed to proceed.   Shared Decision Making Visit Lung Cancer Screening Program [id number])   Eligibility: Age 68 y.o. Pack Years Smoking History Calculation 25 (# packs/per year x # years smoked) Recent History of coughing up blood  no Unexplained weight loss? no ( >Than 15 pounds within the last 6 months ) Prior History Lung / other cancer no (Diagnosis within the last 5 years already requiring surveillance chest CT Scans). Smoking Status Former Smoker Former Smokers: Years since quit: 9 years  Quit Date: 2016  Visit Components: Discussion included one or more decision making aids. yes Discussion included risk/benefits of screening. yes Discussion included potential follow up diagnostic testing for abnormal scans. yes Discussion included meaning and risk of over diagnosis. yes Discussion included meaning and risk of False Positives. yes Discussion included meaning of total radiation exposure. yes  Counseling Included: Importance of adherence to annual lung cancer LDCT screening. yes Impact of comorbidities on ability to participate in the program. yes Ability and willingness to under diagnostic treatment. yes  Smoking Cessation Counseling: Current Smokers:  Discussed importance of smoking cessation. yes Information about tobacco cessation classes and interventions provided to patient.  yes Patient provided with ticket for LDCT Scan. no Symptomatic Patient. no   Diagnosis Code: Tobacco Use Z72.0 Asymptomatic Patient yes   Counseled patient 4 minutes regarding tobacco use.   Former Smokers:  Discussed the importance of maintaining cigarette abstinence. yes Diagnosis Code: Personal History of Nicotine Dependence. S12.108 Information about tobacco cessation classes and interventions provided to patient. Yes Patient provided with ticket for LDCT Scan. no Written Order for Lung Cancer Screening with LDCT placed in Epic. Yes (CT Chest Lung Cancer Screening Low Dose W/O CM) [id number] Z12.2-Screening of respiratory organs Z87.891-Personal history of nicotine dependence   Laneta Speaks, RN

## 2024-04-20 NOTE — Patient Instructions (Signed)

## 2024-04-26 ENCOUNTER — Ambulatory Visit (HOSPITAL_BASED_OUTPATIENT_CLINIC_OR_DEPARTMENT_OTHER)

## 2024-04-28 ENCOUNTER — Ambulatory Visit (HOSPITAL_BASED_OUTPATIENT_CLINIC_OR_DEPARTMENT_OTHER)
Admission: RE | Admit: 2024-04-28 | Discharge: 2024-04-28 | Disposition: A | Discharge status: Home / Self Care | Source: Ambulatory Visit | Attending: Acute Care | Admitting: Acute Care

## 2024-04-28 DIAGNOSIS — Z122 Encounter for screening for malignant neoplasm of respiratory organs: Secondary | ICD-10-CM | POA: Diagnosis not present

## 2024-04-28 DIAGNOSIS — Z87891 Personal history of nicotine dependence: Secondary | ICD-10-CM | POA: Diagnosis not present

## 2024-04-28 DIAGNOSIS — F17210 Nicotine dependence, cigarettes, uncomplicated: Secondary | ICD-10-CM | POA: Diagnosis not present

## 2024-04-29 LAB — GENECONNECT MOLECULAR SCREEN: Genetic Analysis Overall Interpretation: NEGATIVE

## 2024-04-30 ENCOUNTER — Ambulatory Visit (HOSPITAL_BASED_OUTPATIENT_CLINIC_OR_DEPARTMENT_OTHER)

## 2024-05-03 ENCOUNTER — Other Ambulatory Visit: Payer: Self-pay

## 2024-05-03 DIAGNOSIS — Z122 Encounter for screening for malignant neoplasm of respiratory organs: Secondary | ICD-10-CM

## 2024-05-03 DIAGNOSIS — Z87891 Personal history of nicotine dependence: Secondary | ICD-10-CM

## 2024-05-20 DIAGNOSIS — H353132 Nonexudative age-related macular degeneration, bilateral, intermediate dry stage: Secondary | ICD-10-CM | POA: Diagnosis not present

## 2024-05-23 DIAGNOSIS — G4733 Obstructive sleep apnea (adult) (pediatric): Secondary | ICD-10-CM | POA: Diagnosis not present

## 2024-06-09 ENCOUNTER — Ambulatory Visit: Payer: Self-pay

## 2024-06-09 NOTE — Telephone Encounter (Signed)
 FYI Only or Action Required?: FYI only for provider: appointment scheduled on 06/10/24.  Patient was last seen in primary care on 02/18/2024 by Dettinger, Fonda LABOR, MD.  Called Nurse Triage reporting [place].  Symptoms began today.  Interventions attempted: Nothing.  Symptoms are: stable.  Triage Disposition: See Today or Tomorrow in Office (overriding Home Care)  Patient/caregiver understands and will follow disposition?: Yes             Reason for Disposition  [1] Small swelling or lump AND [2] unexplained AND [3] present < 1 week  Answer Assessment - Initial Assessment Questions 1. APPEARANCE of SWELLING: What does it look like?     Puffy, like fluid collection.  2. SIZE: How large is the swelling? (e.g., inches, cm; or compare to size of pinhead, tip of pen, eraser, coin, pea, grape, ping pong ball)      Nickel.  3. LOCATION: Where is the swelling located?     Base of neck, front/midline.  4. ONSET: When did the swelling start?     Today.  5. COLOR: What color is it? Is there more than one color?     No redness, normal skin color.  6. PAIN: Is there any pain? If Yes, ask: How bad is the pain? (Scale 1-10; or mild, moderate, severe)       No.  7. ITCH: Does it itch? If Yes, ask: How bad is the itch?      No.  8. CAUSE: What do you think caused the swelling?     Unsure.  9 OTHER SYMPTOMS: Do you have any other symptoms? (e.g., fever)     No difficulty breathing or swallowing, fever.  Protocols used: Skin Lump or Localized Swelling-A-AH

## 2024-06-09 NOTE — Telephone Encounter (Signed)
 Attempted to contact patient x 1 to discuss symptoms; LVM to return call, Will attempt to contact patient at a later time to further discuss concerns.        Message from Peetz E sent at 06/09/2024 10:02 AM EST  Summary: Just noticed it this morning  lumb on neck   Reason for Triage: Just noticed it this morning Lump at the  bottom front of neck feels -puffy and swollen -knot size of nickle -not red -does not hurt to touch want's to have it checked out

## 2024-06-10 ENCOUNTER — Ambulatory Visit: Admitting: Nurse Practitioner

## 2024-06-10 ENCOUNTER — Encounter: Payer: Self-pay | Admitting: Nurse Practitioner

## 2024-06-10 VITALS — BP 129/73 | HR 56 | Temp 96.7°F | Ht 63.0 in | Wt 189.0 lb

## 2024-06-10 DIAGNOSIS — R221 Localized swelling, mass and lump, neck: Secondary | ICD-10-CM

## 2024-06-10 NOTE — Progress Notes (Signed)
° °  Subjective:    Patient ID: Kayla Romero, female    DOB: 1955-09-17, 68 y.o.   MRN: [id number]   Chief Complaint: neck swelling (Noticed yesterday. Not painful)   HPI  Patient in c/o puffiness at the base of neck. She just noticed yesterday morning. No pain on palpation, no swallowing issues. No recent illinesses. Wt Readings from Last 3 Encounters:  06/10/24 189 lb (85.7 kg)  04/13/24 184 lb (83.5 kg)  02/18/24 184 lb (83.5 kg)    Patient Active Problem List   Diagnosis Date Noted   Osteoporosis 08/21/2023   Acquired hypothyroidism 02/12/2022   Obstructive sleep apnea 09/03/2021   Palpitations 03/25/2019   Obesity (BMI 30.0-34.9) 02/04/2018   Knee pain, chronic 08/08/2014   Anxiety    Depression    Fatty liver    Vitamin D  deficiency        Review of Systems  Constitutional:  Negative for diaphoresis.  Eyes:  Negative for pain.  Respiratory:  Negative for shortness of breath.   Cardiovascular:  Negative for chest pain, palpitations and leg swelling.  Gastrointestinal:  Negative for abdominal pain.  Endocrine: Negative for polydipsia.  Skin:  Negative for rash.  Neurological:  Negative for dizziness, weakness and headaches.  Hematological:  Does not bruise/bleed easily.  All other systems reviewed and are negative.      Objective:   Physical Exam Constitutional:      Appearance: Normal appearance.  Neck:     Comments: Mild soft tissue edema at the base of anterior neck- no masses. Slightly tender to touch. Cardiovascular:     Rate and Rhythm: Normal rate and regular rhythm.     Heart sounds: Normal heart sounds.  Pulmonary:     Breath sounds: Normal breath sounds.  Musculoskeletal:     Cervical back: Normal range of motion and neck supple.  Skin:    General: Skin is warm.  Neurological:     General: No focal deficit present.     Mental Status: She is alert and oriented to person, place, and time.  Psychiatric:        Mood and Affect: Mood  normal.        Behavior: Behavior normal.    BP 129/73   Pulse (!) 56   Temp (!) 96.7 F (35.9 C) (Temporal)   Ht 5' 3 (1.6 m)   Wt 189 lb (85.7 kg)   LMP 11/26/2010   SpO2 95%   BMI 33.48 kg/m         Assessment & Plan:   [name] in today with chief complaint of neck swelling (Noticed yesterday. Not painful)   1. Neck swelling (Primary) Watch for now Will call when get U/S results - US  Soft Tissue Head/Neck (NON-THYROID ); Future    The above assessment and management plan was discussed with the patient. The patient verbalized understanding of and has agreed to the management plan. Patient is aware to call the clinic if symptoms persist or worsen. Patient is aware when to return to the clinic for a follow-up visit. Patient educated on when it is appropriate to go to the emergency department.   Mary-Margaret Gladis, FNP

## 2024-06-17 ENCOUNTER — Ambulatory Visit (HOSPITAL_COMMUNITY)
Admission: RE | Admit: 2024-06-17 | Discharge: 2024-06-17 | Disposition: A | Discharge status: Home / Self Care | Source: Ambulatory Visit | Attending: Nurse Practitioner | Admitting: Nurse Practitioner

## 2024-06-17 DIAGNOSIS — R221 Localized swelling, mass and lump, neck: Secondary | ICD-10-CM | POA: Insufficient documentation

## 2024-06-19 DIAGNOSIS — H353132 Nonexudative age-related macular degeneration, bilateral, intermediate dry stage: Secondary | ICD-10-CM | POA: Diagnosis not present

## 2024-06-20 ENCOUNTER — Ambulatory Visit: Payer: Self-pay | Admitting: Nurse Practitioner

## 2024-08-22 ENCOUNTER — Encounter: Payer: Self-pay | Admitting: Family Medicine

## 2025-04-14 ENCOUNTER — Ambulatory Visit: Payer: Self-pay
